# Patient Record
Sex: Female | Born: 1935 | Race: Black or African American | Hispanic: No | Marital: Married | Smoking: Former smoker
Health system: Southern US, Community
[De-identification: ages and names within clinical notes are randomized; demographics above are authoritative.]

## PROBLEM LIST (undated history)

## (undated) ENCOUNTER — Emergency Department (HOSPITAL_COMMUNITY): Admission: EM | Payer: Medicare Other | Source: Home / Self Care

## (undated) DIAGNOSIS — K219 Gastro-esophageal reflux disease without esophagitis: Secondary | ICD-10-CM

## (undated) DIAGNOSIS — M1990 Unspecified osteoarthritis, unspecified site: Secondary | ICD-10-CM

## (undated) DIAGNOSIS — I10 Essential (primary) hypertension: Secondary | ICD-10-CM

## (undated) DIAGNOSIS — I4891 Unspecified atrial fibrillation: Secondary | ICD-10-CM

## (undated) DIAGNOSIS — K5909 Other constipation: Secondary | ICD-10-CM

## (undated) DIAGNOSIS — L89159 Pressure ulcer of sacral region, unspecified stage: Secondary | ICD-10-CM

## (undated) DIAGNOSIS — E785 Hyperlipidemia, unspecified: Secondary | ICD-10-CM

## (undated) HISTORY — DX: Other constipation: K59.09

## (undated) HISTORY — DX: Pressure ulcer of sacral region, unspecified stage: L89.159

## (undated) HISTORY — PX: NO PAST SURGERIES: SHX2092

## (undated) HISTORY — DX: Unspecified osteoarthritis, unspecified site: M19.90

## (undated) HISTORY — DX: Gastro-esophageal reflux disease without esophagitis: K21.9

## (undated) HISTORY — DX: Hyperlipidemia, unspecified: E78.5

## (undated) HISTORY — DX: Unspecified atrial fibrillation: I48.91

---

## 2002-01-16 ENCOUNTER — Emergency Department (HOSPITAL_COMMUNITY): Admission: EM | Admit: 2002-01-16 | Discharge: 2002-01-16 | Payer: Self-pay | Admitting: Emergency Medicine

## 2002-10-30 ENCOUNTER — Emergency Department (HOSPITAL_COMMUNITY): Admission: EM | Admit: 2002-10-30 | Discharge: 2002-10-30 | Payer: Self-pay | Admitting: Emergency Medicine

## 2007-05-24 ENCOUNTER — Emergency Department (HOSPITAL_COMMUNITY): Admission: EM | Admit: 2007-05-24 | Discharge: 2007-05-24 | Payer: Self-pay | Admitting: Emergency Medicine

## 2007-05-29 ENCOUNTER — Inpatient Hospital Stay (HOSPITAL_COMMUNITY): Admission: AD | Admit: 2007-05-29 | Discharge: 2007-05-31 | Payer: Self-pay | Admitting: Cardiovascular Disease

## 2007-05-30 ENCOUNTER — Encounter (INDEPENDENT_AMBULATORY_CARE_PROVIDER_SITE_OTHER): Payer: Self-pay | Admitting: Cardiovascular Disease

## 2008-10-20 ENCOUNTER — Emergency Department (HOSPITAL_COMMUNITY): Admission: EM | Admit: 2008-10-20 | Discharge: 2008-10-21 | Payer: Self-pay | Admitting: Emergency Medicine

## 2008-10-22 ENCOUNTER — Ambulatory Visit (HOSPITAL_COMMUNITY): Admission: RE | Admit: 2008-10-22 | Discharge: 2008-10-22 | Payer: Self-pay | Admitting: Emergency Medicine

## 2008-10-22 ENCOUNTER — Encounter (INDEPENDENT_AMBULATORY_CARE_PROVIDER_SITE_OTHER): Payer: Self-pay | Admitting: Emergency Medicine

## 2008-10-22 ENCOUNTER — Ambulatory Visit: Payer: Self-pay | Admitting: Vascular Surgery

## 2009-09-07 ENCOUNTER — Emergency Department (HOSPITAL_COMMUNITY): Admission: EM | Admit: 2009-09-07 | Discharge: 2009-09-07 | Payer: Self-pay | Admitting: Emergency Medicine

## 2009-11-12 ENCOUNTER — Encounter: Admission: RE | Admit: 2009-11-12 | Discharge: 2009-11-12 | Payer: Self-pay | Admitting: Cardiovascular Disease

## 2009-11-28 ENCOUNTER — Encounter: Admission: RE | Admit: 2009-11-28 | Discharge: 2009-11-28 | Payer: Self-pay | Admitting: Cardiovascular Disease

## 2010-01-18 ENCOUNTER — Emergency Department (HOSPITAL_COMMUNITY): Admission: EM | Admit: 2010-01-18 | Discharge: 2010-01-19 | Payer: Self-pay | Admitting: Emergency Medicine

## 2010-01-22 ENCOUNTER — Emergency Department (HOSPITAL_COMMUNITY)
Admission: EM | Admit: 2010-01-22 | Discharge: 2010-01-22 | Payer: Self-pay | Source: Home / Self Care | Admitting: Emergency Medicine

## 2010-02-04 ENCOUNTER — Encounter: Admission: RE | Admit: 2010-02-04 | Discharge: 2010-02-04 | Payer: Self-pay | Admitting: Gastroenterology

## 2010-02-10 ENCOUNTER — Encounter: Admission: RE | Admit: 2010-02-10 | Discharge: 2010-02-10 | Payer: Self-pay | Admitting: Gastroenterology

## 2010-02-26 ENCOUNTER — Emergency Department (HOSPITAL_COMMUNITY): Admission: EM | Admit: 2010-02-26 | Discharge: 2010-02-26 | Payer: Self-pay | Admitting: Emergency Medicine

## 2010-10-05 LAB — DIFFERENTIAL
Eosinophils Relative: 4 % (ref 0–5)
Monocytes Absolute: 0.6 10*3/uL (ref 0.1–1.0)
Neutrophils Relative %: 57 % (ref 43–77)

## 2010-10-05 LAB — URINE CULTURE

## 2010-10-05 LAB — URINALYSIS, ROUTINE W REFLEX MICROSCOPIC
Bilirubin Urine: NEGATIVE
Glucose, UA: NEGATIVE mg/dL
Ketones, ur: NEGATIVE mg/dL
Protein, ur: NEGATIVE mg/dL
Specific Gravity, Urine: 1.011 (ref 1.005–1.030)
Urobilinogen, UA: 0.2 mg/dL (ref 0.0–1.0)

## 2010-10-05 LAB — CBC
HCT: 35.2 % — ABNORMAL LOW (ref 36.0–46.0)
MCH: 28.6 pg (ref 26.0–34.0)
Platelets: 272 10*3/uL (ref 150–400)
RDW: 14.8 % (ref 11.5–15.5)

## 2010-10-05 LAB — COMPREHENSIVE METABOLIC PANEL
ALT: 8 U/L (ref 0–35)
Alkaline Phosphatase: 96 U/L (ref 39–117)
BUN: 20 mg/dL (ref 6–23)
CO2: 25 mEq/L (ref 19–32)
Calcium: 9.6 mg/dL (ref 8.4–10.5)
Creatinine, Ser: 1.33 mg/dL — ABNORMAL HIGH (ref 0.4–1.2)
Glucose, Bld: 103 mg/dL — ABNORMAL HIGH (ref 70–99)
Total Protein: 7.3 g/dL (ref 6.0–8.3)

## 2010-10-05 LAB — URINE MICROSCOPIC-ADD ON

## 2010-10-08 LAB — POCT I-STAT, CHEM 8
BUN: 20 mg/dL (ref 6–23)
Calcium, Ion: 1.12 mmol/L (ref 1.12–1.32)
Chloride: 105 mEq/L (ref 96–112)
Potassium: 3.8 mEq/L (ref 3.5–5.1)

## 2010-10-08 LAB — URINALYSIS, ROUTINE W REFLEX MICROSCOPIC
Glucose, UA: NEGATIVE mg/dL
Ketones, ur: NEGATIVE mg/dL
Nitrite: POSITIVE — AB
Urobilinogen, UA: 1 mg/dL (ref 0.0–1.0)

## 2010-10-08 LAB — URINE CULTURE: Colony Count: >=100000

## 2010-10-08 LAB — URINE MICROSCOPIC-ADD ON
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
## 2011-01-22 ENCOUNTER — Telehealth: Payer: Self-pay | Admitting: Pulmonary Disease

## 2011-01-22 NOTE — Telephone Encounter (Signed)
message in wrong chart. Carron Curie, CMA

## 2011-04-28 LAB — COMPREHENSIVE METABOLIC PANEL
ALT: 10
AST: 26
Albumin: 3.3 — ABNORMAL LOW
CO2: 28
Calcium: 9.2
Creatinine, Ser: 1.08
GFR calc Af Amer: 58 — ABNORMAL LOW
GFR calc non Af Amer: 48 — ABNORMAL LOW
Sodium: 137
Total Protein: 7.1

## 2011-04-28 LAB — CK TOTAL AND CKMB (NOT AT ARMC)
CK, MB: 1.8
CK, MB: 2
Relative Index: 1.3
Total CK: 119
Total CK: 151

## 2011-04-28 LAB — BASIC METABOLIC PANEL
BUN: 11
Calcium: 8.8
Calcium: 9.6
Chloride: 106
Creatinine, Ser: 1.07
Creatinine, Ser: 1.14
GFR calc Af Amer: 59 — ABNORMAL LOW
GFR calc non Af Amer: 45 — ABNORMAL LOW
Glucose, Bld: 93
Sodium: 138

## 2011-04-28 LAB — CARDIAC PANEL(CRET KIN+CKTOT+MB+TROPI)
CK, MB: 2.7
Troponin I: 0.02
Troponin I: 0.03

## 2011-04-28 LAB — MAGNESIUM: Magnesium: 2.1

## 2011-04-28 LAB — CBC
MCHC: 32.8
MCV: 85.1
Platelets: 333
RBC: 3.87
RDW: 13.5
WBC: 5.7

## 2011-04-28 LAB — APTT: aPTT: 32

## 2011-04-28 LAB — DIFFERENTIAL
Eosinophils Relative: 1
Lymphocytes Relative: 29
Lymphs Abs: 1.8
Monocytes Relative: 9

## 2011-04-28 LAB — LIPID PANEL
HDL: 49
Triglycerides: 77
VLDL: 15

## 2011-04-28 LAB — TSH: TSH: 1.763

## 2013-11-23 ENCOUNTER — Ambulatory Visit (INDEPENDENT_AMBULATORY_CARE_PROVIDER_SITE_OTHER): Payer: PRIVATE HEALTH INSURANCE | Admitting: Podiatrist

## 2013-11-23 ENCOUNTER — Encounter: Payer: Self-pay | Admitting: Podiatrist

## 2013-11-23 VITALS — BP 149/85 | HR 68 | Resp 17 | Ht 63.0 in | Wt 180.0 lb

## 2013-11-23 DIAGNOSIS — M79609 Pain in unspecified limb: Secondary | ICD-10-CM

## 2013-11-23 DIAGNOSIS — B351 Tinea unguium: Secondary | ICD-10-CM

## 2013-11-23 DIAGNOSIS — M216X9 Other acquired deformities of unspecified foot: Secondary | ICD-10-CM

## 2013-11-23 DIAGNOSIS — Q828 Other specified congenital malformations of skin: Secondary | ICD-10-CM

## 2013-11-23 NOTE — Progress Notes (Signed)
   Subjective:    Patient ID: Sherry [name], female    DOB: 09/17/1919, 80 y.o.   MRN: [id number]  HPI Comments: Pt presents for debridement of 1 - 10 toenails, and left 945ft toe and MPJ corns.     Review of Systems  All other systems reviewed and are negative.      Objective:   Physical Exam .       Assessment & Plan:

## 2013-11-23 NOTE — Progress Notes (Signed)
HPI: Patient presents today for follow up of  foot and nail care. Past medical history, meds, and allergies reviewed.     Objective:  Patients chart is reviewed.  Vascular status reveals pedal pulses noted at  1 out of 4 dp and pt bilateral .  Neurological sensation is Normal to [facility]il[facility] bilateral at 5/5 sites bilateral.  Dermatological exam reveals  presence of pre ulcerative/ hyperkeratotic lesions submet 5 bilateral.   Toenails are elongated, incurvated, discolored, dystrophic with ingrown deformity present.    Assessment: Symptomatic mycotic toenails and hyperkeratotic lesion x2  Plan: Discussed treatment options and alternatives. Debrided nails without complication. Debrided hyperkeratotic lesions without complication.  Return appointment recommended at routine intervals of 3 months.   Marlowe AschoffKathryn Hancel Ion, DPM

## 2014-04-05 ENCOUNTER — Ambulatory Visit (INDEPENDENT_AMBULATORY_CARE_PROVIDER_SITE_OTHER): Payer: PRIVATE HEALTH INSURANCE | Admitting: Podiatrist

## 2014-04-05 DIAGNOSIS — M79609 Pain in unspecified limb: Secondary | ICD-10-CM

## 2014-04-05 DIAGNOSIS — M79676 Pain in unspecified toe(s): Principal | ICD-10-CM

## 2014-04-05 DIAGNOSIS — B351 Tinea unguium: Secondary | ICD-10-CM

## 2014-04-05 NOTE — Progress Notes (Signed)
HPI: Patient presents today for follow up of foot and nail care. Past medical history, meds, and allergies reviewed.  Objective: Patients chart is reviewed. Vascular status reveals pedal pulses noted at 1 out of 4 dp and pt bilateral . Neurological sensation is Normal to Semmes Weinstein monofilament bilateral at 5/5 sites bilateral. Dermatological exam reveals presence of pre ulcerative/ hyperkeratotic lesions submet 5 bilateral. Toenails are elongated, incurvated, discolored, dystrophic with ingrown deformity present.  Assessment: Symptomatic mycotic toenails and hyperkeratotic lesion x2  Plan: Discussed treatment options and alternatives. Debrided nails without complication. Debrided hyperkeratotic lesions without complication. Return appointment recommended at routine intervals of 3 months.  

## 2014-07-05 ENCOUNTER — Ambulatory Visit (INDEPENDENT_AMBULATORY_CARE_PROVIDER_SITE_OTHER): Payer: PRIVATE HEALTH INSURANCE | Admitting: Podiatrist

## 2014-07-05 ENCOUNTER — Encounter: Payer: Self-pay | Admitting: Podiatrist

## 2014-07-05 DIAGNOSIS — M79676 Pain in unspecified toe(s): Secondary | ICD-10-CM

## 2014-07-05 DIAGNOSIS — B351 Tinea unguium: Secondary | ICD-10-CM

## 2014-07-09 NOTE — Progress Notes (Signed)
HPI: Patient presents today for follow up of foot and nail care. Past medical history, meds, and allergies reviewed.  Objective: Patients chart is reviewed. Vascular status reveals pedal pulses noted at 1 out of 4 dp and pt bilateral . Neurological sensation is Normal to [facility] mon[facility] bilateral at 5/5 sites bilateral. Dermatological exam reveals presence of pre ulcerative/ hyperkeratotic lesions submet 5 bilateral. Toenails are elongated, incurvated, discolored, dystrophic with ingrown deformity present.  Assessment: Symptomatic mycotic toenails and hyperkeratotic lesion x2  Plan: Discussed treatment options and alternatives. Debrided nails without complication. Debrided hyperkeratotic lesions without complication. Return appointment recommended at routine intervals of 3 months.

## 2014-10-11 ENCOUNTER — Ambulatory Visit (INDEPENDENT_AMBULATORY_CARE_PROVIDER_SITE_OTHER): Payer: Medicare Other | Admitting: Podiatrist

## 2014-10-11 ENCOUNTER — Encounter: Payer: Self-pay | Admitting: Podiatrist

## 2014-10-11 DIAGNOSIS — M79676 Pain in unspecified toe(s): Secondary | ICD-10-CM

## 2014-10-11 DIAGNOSIS — B351 Tinea unguium: Secondary | ICD-10-CM | POA: Diagnosis not present

## 2014-10-20 NOTE — Progress Notes (Signed)
HPI: Patient presents today for follow up of foot and nail care. Past medical history, meds, and allergies reviewed.  Objective: Patients chart is reviewed. Vascular status reveals pedal pulses noted at 1 out of 4 dp and pt bilateral . Neurological sensation is Normal to Semmes Weinstein monofilament bilateral at 5/5 sites bilateral. Dermatological exam reveals presence of pre ulcerative/ hyperkeratotic lesions submet 5 bilateral. Toenails are elongated, incurvated, discolored, dystrophic with ingrown deformity present.  Assessment: Symptomatic mycotic toenails and hyperkeratotic lesion x2  Plan: Discussed treatment options and alternatives. Debrided nails without complication. Debrided hyperkeratotic lesions without complication. Return appointment recommended at routine intervals of 3 months.  

## 2014-12-20 ENCOUNTER — Ambulatory Visit: Payer: Medicaid Other | Admitting: Podiatry

## 2015-08-20 ENCOUNTER — Ambulatory Visit (INDEPENDENT_AMBULATORY_CARE_PROVIDER_SITE_OTHER): Payer: Medicare Other | Admitting: Podiatry

## 2015-08-20 ENCOUNTER — Encounter: Payer: Self-pay | Admitting: Podiatry

## 2015-08-20 DIAGNOSIS — M79676 Pain in unspecified toe(s): Secondary | ICD-10-CM | POA: Diagnosis not present

## 2015-08-20 DIAGNOSIS — B351 Tinea unguium: Secondary | ICD-10-CM

## 2015-08-20 NOTE — Progress Notes (Signed)
Patient ID: Sherry [name], female   DOB: 05-19-1920, 82 y.o.   MRN: [id number]  Subjective: This patient presents today complaining of uncomfortable toenails on right and left feet especially the first and second toenail areas. Patient presented for a similar problem on 10/11/2014  Objective: Patient's daughter-in-law present in the treatment room The toenails 6-10 are elongated, incurvated, discolored, deformed and tender to direct palpation Keratoses medial second left toe Callused nail groove right hallux nail  Assessment: Symptomatic onychomycoses 6-10 with associated friction keratoses  Plan: Debridement toenails 6-10 mechanically and electrically without any bleeding Recommend foam toe separator and left hallux  Reappoint at patient's request

## 2015-11-29 ENCOUNTER — Encounter: Payer: Self-pay | Admitting: Podiatry

## 2015-11-29 ENCOUNTER — Ambulatory Visit (INDEPENDENT_AMBULATORY_CARE_PROVIDER_SITE_OTHER): Payer: Medicare Other | Admitting: Podiatry

## 2015-11-29 DIAGNOSIS — B351 Tinea unguium: Secondary | ICD-10-CM | POA: Diagnosis not present

## 2015-11-29 DIAGNOSIS — M79676 Pain in unspecified toe(s): Secondary | ICD-10-CM | POA: Diagnosis not present

## 2015-12-02 NOTE — Progress Notes (Signed)
Patient ID: Sherry [name], female   DOB: 12/15/1919, 82 y.o.   MRN: [id number]  Subjective: 82 y.o. returns the office today for painful, elongated, thickened toenails which she cannot trim herself. Denies any redness or drainage around the nails. Denies any acute changes since last appointment and no new complaints today. Denies any systemic complaints such as fevers, chills, nausea, vomiting.   Objective: AAO 3, NAD DP/PT pulses palpable, CRT less than 3 seconds Nails hypertrophic, dystrophic, elongated, brittle, discolored 10. There is tenderness overlying the nails 1-5 bilaterally. There is no surrounding erythema or drainage along the nail sites. No open lesions or pre-ulcerative lesions are identified. No other areas of tenderness bilateral lower extremities. No overlying edema, erythema, increased warmth. No pain with calf compression, swelling, warmth, erythema.  Assessment: Patient presents with symptomatic onychomycosis  Plan: -Treatment options including alternatives, risks, complications were discussed -Nails sharply debrided 10 without complication/bleeding. -Discussed daily foot inspection. If there are any changes, to call the office immediately.  -Follow-up in 3 months or sooner if any problems are to arise. In the meantime, encouraged to call the office with any questions, concerns, changes symptoms.  Sherry CurdMatthew [name], DPM

## 2016-03-06 ENCOUNTER — Ambulatory Visit (INDEPENDENT_AMBULATORY_CARE_PROVIDER_SITE_OTHER): Payer: Medicare Other | Admitting: Podiatry

## 2016-03-06 ENCOUNTER — Encounter: Payer: Self-pay | Admitting: Podiatry

## 2016-03-06 DIAGNOSIS — B351 Tinea unguium: Secondary | ICD-10-CM

## 2016-03-06 DIAGNOSIS — M79676 Pain in unspecified toe(s): Secondary | ICD-10-CM | POA: Diagnosis not present

## 2016-03-09 NOTE — Progress Notes (Signed)
Patient ID: Sherry [name], female   DOB: 05/30/1920, 82 y.o.   MRN: [id number]  Subjective: 82 y.o. returns the office today for painful, elongated, thickened toenails which she cannot trim herself. Denies any redness or drainage around the nails. Denies any acute changes since last appointment and no new complaints today. Denies any systemic complaints such as fevers, chills, nausea, vomiting.   Objective: NAD DP/PT pulses palpable, CRT less than 3 seconds Nails hypertrophic, dystrophic, elongated, brittle, discolored 10. There is tenderness overlying the nails 1-5 bilaterally. There is no surrounding erythema or drainage along the nail sites. No open lesions or pre-ulcerative lesions are identified. No other areas of tenderness bilateral lower extremities. No overlying edema, erythema, increased warmth. No pain with calf compression, swelling, warmth, erythema.  Assessment: Patient presents with symptomatic onychomycosis  Plan: -Treatment options including alternatives, risks, complications were discussed -Nails sharply debrided 10 without complication/bleeding. -Discussed daily foot inspection. If there are any changes, to call the office immediately.  -Follow-up in 3 months or sooner if any problems are to arise. In the meantime, encouraged to call the office with any questions, concerns, changes symptoms.  Ovid CurdMatthew Wagoner, DPM

## 2016-06-05 ENCOUNTER — Encounter: Payer: Self-pay | Admitting: Podiatry

## 2016-06-05 ENCOUNTER — Ambulatory Visit (INDEPENDENT_AMBULATORY_CARE_PROVIDER_SITE_OTHER): Payer: Medicare Other | Admitting: Podiatry

## 2016-06-05 VITALS — Ht 63.0 in | Wt 180.0 lb

## 2016-06-05 DIAGNOSIS — M79676 Pain in unspecified toe(s): Secondary | ICD-10-CM

## 2016-06-05 DIAGNOSIS — Q828 Other specified congenital malformations of skin: Secondary | ICD-10-CM | POA: Diagnosis not present

## 2016-06-05 DIAGNOSIS — B351 Tinea unguium: Secondary | ICD-10-CM | POA: Diagnosis not present

## 2016-06-05 NOTE — Progress Notes (Signed)
Complaint:  Visit Type: Patient returns to my office for continued preventative foot care services. Complaint: Patient states" my nails have grown long and thick and become painful to walk and wear shoes" Patient has been diagnosed with DM with no foot complications. The patient presents for preventative foot care services. No changes to ROS.  Patient relates having painful callus on the outside of her left foot.  Podiatric Exam: Vascular: dorsalis pedis and posterior tibial pulses are palpable bilateral. Capillary return is immediate. Temperature gradient is WNL. Skin turgor WNL  Sensorium: Normal Semmes Weinstein monofilament test. Normal tactile sensation bilaterally. Nail Exam: Pt has thick disfigured discolored nails with subungual debris noted bilateral entire nail hallux through fifth toenails Ulcer Exam: There is no evidence of ulcer or pre-ulcerative changes or infection. Orthopedic Exam: Muscle tone and strength are WNL. No limitations in general ROM. No crepitus or effusions noted. HAV  B/L Skin:  Porokeratosis fifth met left foot. No infection or ulcers  Diagnosis:  Onychomycosis, , Pain in right toe, pain in left toes  Treatment & Plan Procedures and Treatment: Consent by patient was obtained for treatment procedures. The patient understood the discussion of treatment and procedures well. All questions were answered thoroughly reviewed. Debridement of mycotic and hypertrophic toenails, 1 through 5 bilateral and clearing of subungual debris. No ulceration, no infection noted.  Return Visit-Office Procedure: Patient instructed to return to the office for a follow up visit 3 months for continued evaluation and treatment.    Gardiner Barefoot DPM

## 2016-09-01 ENCOUNTER — Ambulatory Visit (INDEPENDENT_AMBULATORY_CARE_PROVIDER_SITE_OTHER): Payer: Medicare Other | Admitting: Podiatry

## 2016-09-01 ENCOUNTER — Encounter: Payer: Self-pay | Admitting: Podiatry

## 2016-09-01 DIAGNOSIS — M79676 Pain in unspecified toe(s): Secondary | ICD-10-CM | POA: Diagnosis not present

## 2016-09-01 DIAGNOSIS — Q828 Other specified congenital malformations of skin: Secondary | ICD-10-CM

## 2016-09-01 DIAGNOSIS — B351 Tinea unguium: Secondary | ICD-10-CM

## 2016-09-01 NOTE — Progress Notes (Addendum)
Complaint:  Visit Type: Patient returns to my office for continued preventative foot care services. Complaint: Patient states" my nails have grown long and thick and become painful to walk and wear shoes" . The patient presents for preventative foot care services. No changes to ROS.  Patient relates having painful callus on the outside of her left foot.  Podiatric Exam: Vascular: dorsalis pedis and posterior tibial pulses are palpable bilateral. Capillary return is immediate. Temperature gradient is WNL. Skin turgor WNL  Sensorium: Normal Semmes Weinstein monofilament test. Normal tactile sensation bilaterally. Nail Exam: Pt has thick disfigured discolored nails with subungual debris noted bilateral entire nail hallux through fifth toenails Ulcer Exam: There is no evidence of ulcer or pre-ulcerative changes or infection. Orthopedic Exam: Muscle tone and strength are WNL. No limitations in general ROM. No crepitus or effusions noted. HAV  B/L Skin:  Porokeratosis fifth met B/L. No infection or ulcers  Diagnosis:  Onychomycosis, , Pain in right toe, pain in left toes.  Debride porokeratosis sub 5th  B/L  Treatment & Plan Procedures and Treatment: Consent by patient was obtained for treatment procedures. The patient understood the discussion of treatment and procedures well. All questions were answered thoroughly reviewed. Debridement of mycotic and hypertrophic toenails, 1 through 5 bilateral and clearing of subungual debris. No ulceration, no infection noted.  Return Visit-Office Procedure: Patient instructed to return to the office for a follow up visit 3 months for continued evaluation and treatment.    Gardiner Barefoot DPM

## 2016-09-04 ENCOUNTER — Ambulatory Visit: Payer: Medicare Other | Admitting: Podiatry

## 2016-12-02 ENCOUNTER — Ambulatory Visit (INDEPENDENT_AMBULATORY_CARE_PROVIDER_SITE_OTHER): Payer: Medicare Other | Admitting: Podiatry

## 2016-12-02 DIAGNOSIS — B351 Tinea unguium: Secondary | ICD-10-CM | POA: Diagnosis not present

## 2016-12-02 DIAGNOSIS — M79676 Pain in unspecified toe(s): Secondary | ICD-10-CM | POA: Diagnosis not present

## 2016-12-02 NOTE — Progress Notes (Signed)
Complaint:  Visit Type: Patient returns to my office for continued preventative foot care services. Complaint: Patient states" my nails have grown long and thick and become painful to walk and wear shoes" . The patient presents for preventative foot care services. No changes to ROS.  Patient relates having painful callus on the outside of her left foot.  Podiatric Exam: Vascular: dorsalis pedis and posterior tibial pulses are palpable bilateral. Capillary return is immediate. Temperature gradient is WNL. Skin turgor WNL  Sensorium: Normal Semmes Weinstein monofilament test. Normal tactile sensation bilaterally. Nail Exam: Pt has thick disfigured discolored nails with subungual debris noted bilateral entire nail hallux through fifth toenails Ulcer Exam: There is no evidence of ulcer or pre-ulcerative changes or infection. Orthopedic Exam: Muscle tone and strength are WNL. No limitations in general ROM. No crepitus or effusions noted. HAV  B/L Skin:  Porokeratosis fifth met B/L. No infection or ulcers  Diagnosis:  Onychomycosis, , Pain in right toe, pain in left toes.  Debride porokeratosis sub 5th  B/L  Treatment & Plan Procedures and Treatment: Consent by patient was obtained for treatment procedures. The patient understood the discussion of treatment and procedures well. All questions were answered thoroughly reviewed. Debridement of mycotic and hypertrophic toenails, 1 through 5 bilateral and clearing of subungual debris. No ulceration, no infection noted.  Return Visit-Office Procedure: Patient instructed to return to the office for a follow up visit 3 months for continued evaluation and treatment.    Gardiner Barefoot DPM

## 2017-01-02 ENCOUNTER — Encounter (HOSPITAL_COMMUNITY): Payer: Self-pay | Admitting: Emergency Medicine

## 2017-01-02 ENCOUNTER — Emergency Department (HOSPITAL_COMMUNITY): Payer: Medicare Other

## 2017-01-02 ENCOUNTER — Emergency Department (HOSPITAL_COMMUNITY)
Admission: EM | Admit: 2017-01-02 | Discharge: 2017-01-02 | Disposition: A | Discharge status: Home / Self Care | Payer: Medicare Other | Attending: Emergency Medicine | Admitting: Emergency Medicine

## 2017-01-02 DIAGNOSIS — I10 Essential (primary) hypertension: Secondary | ICD-10-CM | POA: Insufficient documentation

## 2017-01-02 DIAGNOSIS — Z87891 Personal history of nicotine dependence: Secondary | ICD-10-CM | POA: Diagnosis not present

## 2017-01-02 DIAGNOSIS — Y9301 Activity, walking, marching and hiking: Secondary | ICD-10-CM | POA: Diagnosis not present

## 2017-01-02 DIAGNOSIS — Y999 Unspecified external cause status: Secondary | ICD-10-CM | POA: Insufficient documentation

## 2017-01-02 DIAGNOSIS — Y929 Unspecified place or not applicable: Secondary | ICD-10-CM | POA: Insufficient documentation

## 2017-01-02 DIAGNOSIS — W01198A Fall on same level from slipping, tripping and stumbling with subsequent striking against other object, initial encounter: Secondary | ICD-10-CM | POA: Insufficient documentation

## 2017-01-02 DIAGNOSIS — S7002XA Contusion of left hip, initial encounter: Secondary | ICD-10-CM | POA: Diagnosis not present

## 2017-01-02 DIAGNOSIS — S43402A Unspecified sprain of left shoulder joint, initial encounter: Secondary | ICD-10-CM | POA: Diagnosis not present

## 2017-01-02 DIAGNOSIS — S4992XA Unspecified injury of left shoulder and upper arm, initial encounter: Secondary | ICD-10-CM | POA: Diagnosis present

## 2017-01-02 DIAGNOSIS — Z79899 Other long term (current) drug therapy: Secondary | ICD-10-CM | POA: Diagnosis not present

## 2017-01-02 DIAGNOSIS — W19XXXA Unspecified fall, initial encounter: Secondary | ICD-10-CM

## 2017-01-02 HISTORY — DX: Essential (primary) hypertension: I10

## 2017-01-02 MED ORDER — TRAMADOL HCL 50 MG PO TABS: 50.0000 mg | ORAL_TABLET | Freq: Four times a day (QID) | ORAL | 0 refills | Status: DC | PRN

## 2017-01-02 NOTE — ED Triage Notes (Signed)
Pt complaint of left shoulder and left hip pain post fall trying to get out of chair last night; denies blood thinner use or LOC.

## 2017-01-02 NOTE — Discharge Instructions (Signed)
Tramadol as prescribed as needed for pain.  Ice for 20 minutes every 2 hours while awake for the next 2 days.  Follow-up with your primary Dr. if not improving in the next week.

## 2017-01-02 NOTE — ED Provider Notes (Signed)
[facility]-EMERGENCY DEPT Provider Note   CSN: [id number] Arrival date & time: 01/02/17  1209     History   Chief Complaint Chief Complaint  Patient presents with  . Fall  . Hip Pain  . Shoulder Pain    HPI Sherry [name] is a 83 y.o. female.  Patient is a 83 year old female with history of hypertension presenting for evaluation of fall. She was attempting to get up from her walker yesterday when she caught her dress on the corner of the walker, causing her to lose her balance and fall. She reports landing on the left side injuring her left shoulder and left hip. She was able to get up and has been ambulatory since. She does complain of some discomfort in her left shoulder with movement. She denies any numbness or tingling. She denies any other injury sustained in the fall.   The history is provided by the patient.  Fall  This is a new problem. The current episode started yesterday. The problem occurs constantly. The problem has not changed since onset.The symptoms are aggravated by walking (Movement and palpation). Nothing relieves the symptoms. She has tried nothing for the symptoms.    Past Medical History:  Diagnosis Date  . Hypertension     There are no active problems to display for this patient.   History reviewed. No pertinent surgical history.  OB History    No data available       Home Medications    Prior to Admission medications   Medication Sig Start Date End Date Taking? Authorizing Provider  amLODipine-olmesartan (AZOR) 5-20 MG per tablet Take 1 tablet by mouth daily.    [provider]  atorvastatin (LIPITOR) 20 MG tablet Take 20 mg by mouth daily.    [provider]  metoprolol succinate (TOPROL-XL) 25 MG 24 hr tablet Take 25 mg by mouth daily.    [provider]    Family History No family history on file.  Social History Social History  Substance Use Topics  . Smoking status: Former Games developermoker  . Smokeless tobacco: Never  Used  . Alcohol use No     Allergies   Patient has no known allergies.   Review of Systems Review of Systems  All other systems reviewed and are negative.    Physical Exam Updated Vital Signs BP (!) 154/66 (BP Location: Right Arm)   Pulse 66   Temp 97.9 F (36.6 C) (Oral)   Resp 18   Ht 5\' 4"  (1.626 m)   Wt 79.4 kg (175 lb)   SpO2 96%   BMI 30.04 kg/m   Physical Exam  Constitutional: She is oriented to person, place, and time. She appears well-developed and well-nourished. No distress.  HENT:  Head: Normocephalic and atraumatic.  Neck: Normal range of motion. Neck supple.  Cardiovascular: Normal rate and regular rhythm.  Exam reveals no gallop and no friction rub.   No murmur heard. Pulmonary/Chest: Effort normal and breath sounds normal. No respiratory distress. She has no wheezes.  Abdominal: Soft. Bowel sounds are normal. She exhibits no distension. There is no tenderness.  Musculoskeletal: Normal range of motion.  The left shoulder appears mostly normal. She has good range of motion with minimal discomfort. There is some tenderness over the before meals joint and lateral aspect. She is able to flex, extend, and oppose all fingers. Ulnar and radial pulses are easily palpable, and sensation is intact throughout the entire hand.  The left hip appears grossly normal. There  is slight tenderness over the lateral aspect. She has good range of motion and distal PMS is intact.  Neurological: She is alert and oriented to person, place, and time.  Skin: Skin is warm and dry. She is not diaphoretic.  Nursing note and vitals reviewed.    ED Treatments / Results  Labs (all labs ordered are listed, but only abnormal results are displayed) Labs Reviewed - No data to display  EKG  EKG Interpretation None       Radiology Dg Shoulder Left  Result Date: 01/02/2017 CLINICAL DATA:  Fall at home yesterday. Left shoulder pain. Initial encounter. EXAM: LEFT SHOULDER - 2+ VIEW  COMPARISON:  None. FINDINGS: No acute fracture. No dislocation. Advanced glenohumeral osteoarthritis with spurring and humeral head remodeling. High humeral head suggesting chronic rotator cuff tear. Negative visualized left ribs. IMPRESSION: 1. No acute finding. 2. Advanced glenohumeral osteoarthritis. High humeral head suggesting chronic rotator cuff tear. Electronically Signed   By: Marnee Spring M.D.   On: 01/02/2017 14:31   Dg Hip Unilat W Or Wo Pelvis 2-3 Views Left  Result Date: 01/02/2017 CLINICAL DATA:  Fall at home yesterday which generalized left hip pain. EXAM: DG HIP (WITH OR WITHOUT PELVIS) 2-3V LEFT COMPARISON:  None. FINDINGS: No visible fracture or malalignment. Left hip osteoarthritis with moderate spurring and axial joint narrowing. Questionable remote inferior pubic ramus fracture on the right. Osteopenia. Lower lumbar degenerative disc disease. IMPRESSION: 1. No acute finding. 2. Asymmetric left hip osteoarthritis. Electronically Signed   By: Marnee Spring M.D.   On: 01/02/2017 14:33    Procedures Procedures (including critical care time)  Medications Ordered in ED Medications - No data to display   Initial Impression / Assessment and Plan / ED Course  I have reviewed the triage vital signs and the nursing notes.  Pertinent labs & imaging results that were available during my care of the patient were reviewed by me and considered in my medical decision making (see chart for details).  X-rays are negative for fracture. She is ambulatory and has good range of motion of the shoulder. I highly doubt occult fracture or significant ligamentous injury. She will be discharged with tramadol, rest, and follow-up as needed if not improving.  Final Clinical Impressions(s) / ED Diagnoses   Final diagnoses:  None    New Prescriptions New Prescriptions   No medications on file     Geoffery Lyons, MD 01/02/17 1512

## 2017-01-15 ENCOUNTER — Emergency Department (HOSPITAL_COMMUNITY): Payer: Medicare Other

## 2017-01-15 ENCOUNTER — Emergency Department (HOSPITAL_COMMUNITY)
Admission: EM | Admit: 2017-01-15 | Discharge: 2017-01-15 | Disposition: A | Discharge status: Home / Self Care | Payer: Medicare Other | Attending: Emergency Medicine | Admitting: Emergency Medicine

## 2017-01-15 ENCOUNTER — Encounter (HOSPITAL_COMMUNITY): Payer: Self-pay | Admitting: *Deleted

## 2017-01-15 DIAGNOSIS — Z87891 Personal history of nicotine dependence: Secondary | ICD-10-CM | POA: Diagnosis not present

## 2017-01-15 DIAGNOSIS — Y998 Other external cause status: Secondary | ICD-10-CM | POA: Diagnosis not present

## 2017-01-15 DIAGNOSIS — I10 Essential (primary) hypertension: Secondary | ICD-10-CM | POA: Diagnosis not present

## 2017-01-15 DIAGNOSIS — Y929 Unspecified place or not applicable: Secondary | ICD-10-CM | POA: Insufficient documentation

## 2017-01-15 DIAGNOSIS — S46912A Strain of unspecified muscle, fascia and tendon at shoulder and upper arm level, left arm, initial encounter: Secondary | ICD-10-CM | POA: Diagnosis not present

## 2017-01-15 DIAGNOSIS — S4992XA Unspecified injury of left shoulder and upper arm, initial encounter: Secondary | ICD-10-CM | POA: Diagnosis present

## 2017-01-15 DIAGNOSIS — W19XXXA Unspecified fall, initial encounter: Secondary | ICD-10-CM | POA: Diagnosis not present

## 2017-01-15 DIAGNOSIS — Z79899 Other long term (current) drug therapy: Secondary | ICD-10-CM | POA: Insufficient documentation

## 2017-01-15 DIAGNOSIS — Y939 Activity, unspecified: Secondary | ICD-10-CM | POA: Diagnosis not present

## 2017-01-15 MED ORDER — ACETAMINOPHEN 325 MG PO TABS
650.0000 mg | ORAL_TABLET | Freq: Once | ORAL | Status: AC
  Administered 2017-01-15: 650 mg via ORAL
  Filled 2017-01-15: qty 2

## 2017-01-15 NOTE — ED Triage Notes (Signed)
Pt reports L shoulder pain onset last week post fall, [facility], states, "they said it wasn't broke but it started hurting again and I cannot move it." pt c/o productive cough, A&O x4

## 2017-01-15 NOTE — ED Provider Notes (Signed)
[facility]-EMERGENCY DEPT Provider Note   CSN: [id number] Arrival date & time: 01/15/17  1555     History   Chief Complaint Chief Complaint  Patient presents with  . Shoulder Pain    HPI Sherry [name] is a 83 y.o. female.  The history is provided by the patient and a relative. No language interpreter was used.  Shoulder Pain      Sherry [name] is a 83 y.o. female who presents to the Emergency Department complaining of shoulder pain.  She reports pain in her left shoulder following a fall that occurred 1 week ago. When she fell she landed on her left shoulder and hip. She has been taking tramadol and naproxen at home with partial improvement in her symptoms. No fevers, chest pain, shortness of breath, abdominal pain, vomiting. Pain in her shoulder is worse with range of motion. The pain does radiate to the left side of her neck at times. She is right-handed. Symptoms are moderate, constant, worsening.  Past Medical History:  Diagnosis Date  . Hypertension     There are no active problems to display for this patient.   History reviewed. No pertinent surgical history.  OB History    No data available       Home Medications    Prior to Admission medications   Medication Sig Start Date End Date Taking? Authorizing Provider  amLODipine (NORVASC) 5 MG tablet Take 5 mg by mouth daily. 10/26/16   [provider]  amLODipine-olmesartan (AZOR) 5-20 MG per tablet Take 1 tablet by mouth daily.    [provider]  atorvastatin (LIPITOR) 20 MG tablet Take 20 mg by mouth daily.    [provider]  losartan (COZAAR) 50 MG tablet Take 50 mg by mouth daily. 10/26/16   [provider]  metoprolol succinate (TOPROL-XL) 25 MG 24 hr tablet Take 25 mg by mouth daily.    [provider]  metoprolol tartrate (LOPRESSOR) 25 MG tablet Take 25 mg by mouth 2 (two) times daily. 11/09/16   [provider]  traMADol (ULTRAM) 50 MG tablet Take 1 tablet  (50 mg total) by mouth every 6 (six) hours as needed. 01/02/17   Geoffery Lyons, MD    Family History No family history on file.  Social History Social History  Substance Use Topics  . Smoking status: Former Games developer  . Smokeless tobacco: Never Used  . Alcohol use No     Allergies   Patient has no known allergies.   Review of Systems Review of Systems  All other systems reviewed and are negative.    Physical Exam Updated Vital Signs BP 97/75   Pulse 91   Temp 99.3 F (37.4 C) (Oral)   Resp 20   SpO2 98%   Physical Exam  Constitutional: She is oriented to person, place, and time. She appears well-developed and well-nourished.  HENT:  Head: Normocephalic and atraumatic.  Cardiovascular: Normal rate and regular rhythm.   Murmur heard. Pulmonary/Chest: Effort normal and breath sounds normal. No respiratory distress.  Abdominal: Soft. There is no tenderness. There is no rebound and no guarding.  Musculoskeletal:  2+ radial pulses bilaterally. There is tenderness to palpation over the left posterior shoulder with pain with active and pain right passive range of motion of the left shoulder. No tenderness throughout the left upper arm, elbow, forearm, chest, neck. No erythema to the shoulder  Neurological: She is alert and oriented to person, place, and time.  5 out 5  grip strength to bilateral upper extremities with sensation to light touch intact in bilateral upper extremities  Skin: Skin is warm and dry.  Psychiatric: She has a normal mood and affect. Her behavior is normal.  Nursing note and vitals reviewed.    ED Treatments / Results  Labs (all labs ordered are listed, but only abnormal results are displayed) Labs Reviewed - No data to display  EKG  EKG Interpretation None       Radiology Dg Chest 2 View  Result Date: 01/15/2017 CLINICAL DATA:  Left-sided neck pain and shoulder pain tonight. EXAM: CHEST  2 VIEW COMPARISON:  05/27/2007 FINDINGS: Moderate  cardiomegaly and aortic tortuosity. No consolidation. No effusion. Normal pulmonary vasculature. IMPRESSION: Cardiomegaly and aortic tortuosity.  No consolidation or effusion. Electronically Signed   By: Ellery Plunkaniel R Mitchell M.D.   On: 01/15/2017 22:40   Dg Shoulder Left  Result Date: 01/15/2017 CLINICAL DATA:  83 year old female left shoulder pain following fall last week. EXAM: LEFT SHOULDER - 2+ VIEW COMPARISON:  01/02/2017 prior radiographs FINDINGS: Severe glenohumeral joint degenerative changes are noted. No definite fracture is noted but evaluation slightly limited secondary to degenerative changes. There is no evidence of dislocation. IMPRESSION: No definite acute bony abnormality. Severe glenohumeral joint degenerative changes with slightly limits sensitivity. Electronically Signed   By: Harmon PierJeffrey  Hu M.D.   On: 01/15/2017 17:55    Procedures Procedures (including critical care time)  Medications Ordered in ED Medications  acetaminophen (TYLENOL) tablet 650 mg (650 mg Oral Given 01/15/17 2154)     Initial Impression / Assessment and Plan / ED Course  I have reviewed the triage vital signs and the nursing notes.  Pertinent labs & imaging results that were available during my care of the patient were reviewed by me and considered in my medical decision making (see chart for details).     Patient here for evaluation of left shoulder pain following a fall 1 week ago. No evidence of acute fracture or dislocation. Presentation is not consistent with referred cardiac/pulmonary pain, septic arthritis or gouty arthritis. Counseled pt on home care for shoulder pain/contusion. Discussed Tylenol as well as Aleve. Discussed sling for comfort with range of motion. Discussed outpatient follow-up and return precautions.  Final Clinical Impressions(s) / ED Diagnoses   Final diagnoses:  Strain of left shoulder, initial encounter    New Prescriptions New Prescriptions   No medications on file       Tilden Fossaees, Isbella Arline, MD 01/15/17 2258

## 2017-01-15 NOTE — ED Notes (Signed)
ED Provider at bedside. 

## 2017-01-15 NOTE — ED Notes (Signed)
[facility], informed that I have assigned a room and one it is clean staff will be out to get them.

## 2017-01-15 NOTE — ED Notes (Signed)
Patient transported to X-ray 

## 2017-01-15 NOTE — ED Notes (Signed)
Pt family member requesting update, apologized for delay.

## 2017-03-10 ENCOUNTER — Ambulatory Visit (INDEPENDENT_AMBULATORY_CARE_PROVIDER_SITE_OTHER): Payer: Medicare Other | Admitting: Podiatry

## 2017-03-10 ENCOUNTER — Encounter: Payer: Self-pay | Admitting: Podiatry

## 2017-03-10 DIAGNOSIS — M79676 Pain in unspecified toe(s): Secondary | ICD-10-CM | POA: Diagnosis not present

## 2017-03-10 DIAGNOSIS — B351 Tinea unguium: Secondary | ICD-10-CM | POA: Diagnosis not present

## 2017-03-10 NOTE — Progress Notes (Signed)
Complaint:  Visit Type: Patient returns to my office for continued preventative foot care services. Complaint: Patient states" my nails have grown long and thick and become painful to walk and wear shoes" . The patient presents for preventative foot care services. No changes to ROS.  Patient relates having painful callus on the outside of her left foot.  Podiatric Exam: Vascular: dorsalis pedis and posterior tibial pulses are palpable bilateral. Capillary return is immediate. Temperature gradient is WNL. Skin turgor WNL  Sensorium: Normal Semmes Weinstein monofilament test. Normal tactile sensation bilaterally. Nail Exam: Pt has thick disfigured discolored nails with subungual debris noted bilateral entire nail hallux through fifth toenails Ulcer Exam: There is no evidence of ulcer or pre-ulcerative changes or infection. Orthopedic Exam: Muscle tone and strength are WNL. No limitations in general ROM. No crepitus or effusions noted. HAV  B/L Skin:   Asymptomatic porokeratosis sub 5th  B/Lorokeratosis fifth met B/L. No infection or ulcers  Diagnosis:  Onychomycosis, , Pain in right toe, pain in left toes.    Treatment & Plan Procedures and Treatment: Consent by patient was obtained for treatment procedures. The patient understood the discussion of treatment and procedures well. All questions were answered thoroughly reviewed. Debridement of mycotic and hypertrophic toenails, 1 through 5 bilateral and clearing of subungual debris. No ulceration, no infection noted.  Return Visit-Office Procedure: Patient instructed to return to the office for a follow up visit 3 months for continued evaluation and treatment.    Gardiner Barefoot DPM

## 2017-05-25 ENCOUNTER — Encounter: Payer: Self-pay | Admitting: Podiatry

## 2017-05-25 ENCOUNTER — Ambulatory Visit (INDEPENDENT_AMBULATORY_CARE_PROVIDER_SITE_OTHER): Payer: Medicare Other | Admitting: Podiatry

## 2017-05-25 DIAGNOSIS — M79676 Pain in unspecified toe(s): Secondary | ICD-10-CM

## 2017-05-25 DIAGNOSIS — B351 Tinea unguium: Secondary | ICD-10-CM | POA: Diagnosis not present

## 2017-05-25 NOTE — Progress Notes (Signed)
Complaint:  Visit Type: Patient returns to my office for continued preventative foot care services. Complaint: Patient states" my nails have grown long and thick and become painful to walk and wear shoes" . The patient presents for preventative foot care services. No changes to ROS.  Patient relates having painful callus on the outside of her left foot.  Podiatric Exam: Vascular: dorsalis pedis and posterior tibial pulses are palpable bilateral. Capillary return is immediate. Temperature gradient is WNL. Skin turgor WNL  Sensorium: Normal Semmes Weinstein monofilament test. Normal tactile sensation bilaterally. Nail Exam: Pt has thick disfigured discolored nails with subungual debris noted bilateral entire nail hallux through fifth toenails Ulcer Exam: There is no evidence of ulcer or pre-ulcerative changes or infection. Orthopedic Exam: Muscle tone and strength are WNL. No limitations in general ROM. No crepitus or effusions noted. HAV  B/L Skin:   Asymptomatic porokeratosis sub 5th  B/Lorokeratosis fifth met B/L. No infection or ulcers  Diagnosis:  Onychomycosis, , Pain in right toe, pain in left toes.    Treatment & Plan Procedures and Treatment: Consent by patient was obtained for treatment procedures. The patient understood the discussion of treatment and procedures well. All questions were answered thoroughly reviewed. Debridement of mycotic and hypertrophic toenails, 1 through 5 bilateral and clearing of subungual debris. No ulceration, no infection noted. ABN signed for 2018. Return Visit-Office Procedure: Patient instructed to return to the office for a follow up visit 3 months for continued evaluation and treatment.    Gardiner Barefoot DPM

## 2017-08-03 ENCOUNTER — Ambulatory Visit: Payer: Medicare Other | Admitting: Podiatry

## 2017-08-07 ENCOUNTER — Encounter (HOSPITAL_COMMUNITY): Payer: Self-pay | Admitting: Emergency Medicine

## 2017-08-07 ENCOUNTER — Emergency Department (HOSPITAL_COMMUNITY): Payer: Medicare Other

## 2017-08-07 ENCOUNTER — Emergency Department (HOSPITAL_COMMUNITY)
Admission: EM | Admit: 2017-08-07 | Discharge: 2017-08-07 | Disposition: A | Discharge status: Home / Self Care | Payer: Medicare Other | Attending: Emergency Medicine | Admitting: Emergency Medicine

## 2017-08-07 DIAGNOSIS — M25512 Pain in left shoulder: Secondary | ICD-10-CM | POA: Diagnosis not present

## 2017-08-07 DIAGNOSIS — I10 Essential (primary) hypertension: Secondary | ICD-10-CM | POA: Insufficient documentation

## 2017-08-07 DIAGNOSIS — Z79899 Other long term (current) drug therapy: Secondary | ICD-10-CM | POA: Insufficient documentation

## 2017-08-07 DIAGNOSIS — R079 Chest pain, unspecified: Secondary | ICD-10-CM | POA: Insufficient documentation

## 2017-08-07 DIAGNOSIS — Z87891 Personal history of nicotine dependence: Secondary | ICD-10-CM | POA: Insufficient documentation

## 2017-08-07 LAB — BASIC METABOLIC PANEL
ANION GAP: 12 (ref 5–15)
BUN: 22 mg/dL — ABNORMAL HIGH (ref 6–20)
CHLORIDE: 103 mmol/L (ref 101–111)
CO2: 23 mmol/L (ref 22–32)
Calcium: 9.4 mg/dL (ref 8.9–10.3)
Creatinine, Ser: 1.2 mg/dL — ABNORMAL HIGH (ref 0.44–1.00)
GFR calc Af Amer: 43 mL/min — ABNORMAL LOW (ref >60)
GFR, EST NON AFRICAN AMERICAN: 37 mL/min — AB (ref >60)
GLUCOSE: 83 mg/dL (ref 65–99)
POTASSIUM: 4 mmol/L (ref 3.5–5.1)
Sodium: 138 mmol/L (ref 135–145)

## 2017-08-07 LAB — CBC
HEMATOCRIT: 36.9 % (ref 36.0–46.0)
HEMOGLOBIN: 12 g/dL (ref 12.0–15.0)
MCH: 28.3 pg (ref 26.0–34.0)
MCHC: 32.5 g/dL (ref 30.0–36.0)
MCV: 87 fL (ref 78.0–100.0)
Platelets: 313 10*3/uL (ref 150–400)
RBC: 4.24 MIL/uL (ref 3.87–5.11)
RDW: 13.7 % (ref 11.5–15.5)
WBC: 5.6 10*3/uL (ref 4.0–10.5)

## 2017-08-07 LAB — I-STAT TROPONIN, ED: Troponin i, poc: 0.01 ng/mL (ref 0.00–0.08)

## 2017-08-07 MED ORDER — TRAMADOL HCL 50 MG PO TABS: 50.0000 mg | ORAL_TABLET | Freq: Two times a day (BID) | ORAL | 0 refills | Status: DC | PRN

## 2017-08-07 NOTE — ED Notes (Signed)
Patient able to ambulate independently  

## 2017-08-07 NOTE — ED Notes (Signed)
Pt reports that her daughter recently passed and her funeral was today.

## 2017-08-07 NOTE — ED Provider Notes (Signed)
[facility] EMERGENCY DEPARTMENT Provider Note   CSN: [id number] Arrival date & time: 08/07/17  1451     History   Chief Complaint Chief Complaint  Patient presents with  . Chest Pain    HPI Sherry [name] is a 84 y.o. female.  Patient reports left superior anterior chest pain and associated left shoulder joint pain for unknown period of time.  Her daughter passed away recently and the funeral was today.  No dyspnea, diaphoresis, nausea.  Past medical history hypertension.  No known cardiac disease.  Pain in shoulder is worse with range of motion.  Severity of symptoms is mild.      Past Medical History:  Diagnosis Date  . Hypertension     There are no active problems to display for this patient.   History reviewed. No pertinent surgical history.  OB History    No data available       Home Medications    Prior to Admission medications   Medication Sig Start Date End Date Taking? Authorizing Provider  amLODipine (NORVASC) 5 MG tablet Take 5 mg by mouth daily. 10/26/16   [provider]  amLODipine-olmesartan (AZOR) 5-20 MG per tablet Take 1 tablet by mouth daily.    [provider]  atorvastatin (LIPITOR) 20 MG tablet Take 20 mg by mouth daily.    [provider]  losartan (COZAAR) 50 MG tablet Take 50 mg by mouth daily. 10/26/16   [provider]  metoprolol succinate (TOPROL-XL) 25 MG 24 hr tablet Take 25 mg by mouth daily.    [provider]  metoprolol tartrate (LOPRESSOR) 25 MG tablet Take 25 mg by mouth 2 (two) times daily. 11/09/16   [provider]  traMADol (ULTRAM) 50 MG tablet Take 1 tablet (50 mg total) by mouth 2 (two) times daily as needed. 08/07/17   Donnetta Hutching, MD    Family History No family history on file.  Social History Social History   Tobacco Use  . Smoking status: Former Games developer  . Smokeless tobacco: Never Used  Substance Use Topics  . Alcohol use: No  . Drug use:  No     Allergies   Patient has no known allergies.   Review of Systems Review of Systems  All other systems reviewed and are negative.    Physical Exam Updated Vital Signs BP (!) 143/63   Pulse 78   Temp 98.3 F (36.8 C) (Oral)   Resp 17   Ht 4\' 11"  (1.499 m)   Wt 81.2 kg (179 lb)   SpO2 98%   BMI 36.15 kg/m   Physical Exam  Constitutional: She is oriented to person, place, and time. She appears well-developed and well-nourished.  HENT:  Head: Normocephalic and atraumatic.  Eyes: Conjunctivae are normal.  Neck: Neck supple.  Cardiovascular: Normal rate and regular rhythm.  Pulmonary/Chest: Effort normal and breath sounds normal.  Abdominal: Soft. Bowel sounds are normal.  Musculoskeletal:  Limited range of motion of left shoulder  Neurological: She is alert and oriented to person, place, and time.  Skin: Skin is warm and dry.  Psychiatric: She has a normal mood and affect. Her behavior is normal.  Nursing note and vitals reviewed.    ED Treatments / Results  Labs (all labs ordered are listed, but only abnormal results are displayed) Labs Reviewed  BASIC METABOLIC PANEL - Abnormal; Notable for the following components:      Result Value   BUN 22 (*)  Creatinine, Ser 1.20 (*)    GFR calc non Af Amer 37 (*)    GFR calc Af Amer 43 (*)    All other components within normal limits  CBC  I-STAT TROPONIN, ED    EKG  EKG Interpretation  Date/Time:  Saturday August 07 2017 14:57:53 EST Ventricular Rate:  74 PR Interval:  284 QRS Duration: 140 QT Interval:  442 QTC Calculation: 490 R Axis:   -59 Text Interpretation:  Sinus rhythm with 1st degree A-V block Left axis deviation Right bundle branch block Inferior infarct , age undetermined Abnormal ECG Confirmed by Donnetta Hutchingook, Anaid Haney ([id number]) on 08/07/2017 5:39:26 PM       Radiology Dg Chest 2 View  Result Date: 08/07/2017 CLINICAL DATA:  Pt c/o chest pain that radiates to the left shoulder. Pt reports a  productive cough and fall in the last few days. Denies shortness of breath/nausea/back pain/diaphoresis. Hx of HTN. Former smoker. EXAM: CHEST  2 VIEW COMPARISON:  None. FINDINGS: Normal mediastinum and cardiac silhouette. Normal pulmonary vasculature. No evidence of effusion, infiltrate, or pneumothorax. No acute bony abnormality. Atherosclerotic calcification of the aorta. Multilevel disc osteophytic disease of the spine. Degenerate change of the shoulders. IMPRESSION: 1.  No acute cardiopulmonary process. 2.  Aortic Atherosclerosis (ICD10-I70.0). Electronically Signed   By: Genevive BiStewart  Edmunds M.D.   On: 08/07/2017 17:04   Dg Shoulder Left  Result Date: 08/07/2017 CLINICAL DATA:  Left side shoulder pain x1 week. Pain worsening over the past 3 days. Hx of arthritis in the knees. No hx of left shoulder injuries or surgeries. EXAM: LEFT SHOULDER - 2+ VIEW COMPARISON:  01/15/2017 FINDINGS: There is severe narrowing of the glenohumeral joint. Bone on bone approximation. No fracture dislocation. No change from prior. IMPRESSION: 1. Severe arthropathy of the shoulder joint. 2. No acute findings. Electronically Signed   By: Genevive BiStewart  Edmunds M.D.   On: 08/07/2017 19:03    Procedures Procedures (including critical care time)  Medications Ordered in ED Medications - No data to display   Initial Impression / Assessment and Plan / ED Course  I have reviewed the triage vital signs and the nursing notes.  Pertinent labs & imaging results that were available during my care of the patient were reviewed by me and considered in my medical decision making (see chart for details).    Patient presents with chest pain and left shoulder pain. Cardiac workup is acceptable.  Left shoulder films reveal significant arthropathy of the shoulder joint.  Shoulder pain is her most significant concern.  She is hemodynamically stable at discharge.  Discharge medications tramadol.   Final Clinical Impressions(s) / ED Diagnoses     Final diagnoses:  Chest pain, unspecified type  Left shoulder pain, unspecified chronicity    ED Discharge Orders        Ordered    traMADol (ULTRAM) 50 MG tablet  2 times daily PRN     08/07/17 2018       Donnetta Hutchingook, Whitfield Dulay, MD 08/07/17 2024

## 2017-08-07 NOTE — ED Triage Notes (Signed)
Pt c/o chest pain that radiates to the left shoulder. Pt reports a productive cough and fall in the last few days. Denies shortness of breath/nausea/back pain/diaphoresis.

## 2017-08-07 NOTE — Discharge Instructions (Signed)
X-ray of your left shoulder significant arthritis.  Otherwise tests were good.  Follow-up your primary care doctor.  Prescription for pain medicine.  Recommend heating pad.

## 2017-08-11 ENCOUNTER — Ambulatory Visit (INDEPENDENT_AMBULATORY_CARE_PROVIDER_SITE_OTHER): Payer: Medicare Other | Admitting: Podiatry

## 2017-08-11 ENCOUNTER — Encounter: Payer: Self-pay | Admitting: Podiatry

## 2017-08-11 DIAGNOSIS — M79676 Pain in unspecified toe(s): Secondary | ICD-10-CM | POA: Diagnosis not present

## 2017-08-11 DIAGNOSIS — B351 Tinea unguium: Secondary | ICD-10-CM

## 2017-08-11 DIAGNOSIS — Q828 Other specified congenital malformations of skin: Secondary | ICD-10-CM | POA: Diagnosis not present

## 2017-08-11 NOTE — Progress Notes (Signed)
Complaint:  Visit Type: Patient returns to my office for continued preventative foot care services. Complaint: Patient states" my nails have grown long and thick and become painful to walk and wear shoes" . The patient presents for preventative foot care services. No changes to ROS.  Patient relates having painful callus on the outside of her left foot.  Podiatric Exam: Vascular: dorsalis pedis and posterior tibial pulses are palpable bilateral. Capillary return is immediate. Temperature gradient is WNL. Skin turgor WNL  Sensorium: Normal Semmes Weinstein monofilament test. Normal tactile sensation bilaterally. Nail Exam: Pt has thick disfigured discolored nails with subungual debris noted bilateral entire nail hallux through fifth toenails Ulcer Exam: There is no evidence of ulcer or pre-ulcerative changes or infection. Orthopedic Exam: Muscle tone and strength are WNL. No limitations in general ROM. No crepitus or effusions noted. HAV  B/L Skin:   Symptomatic porokeratosis sub 5th Left.   No infection or ulcers  Diagnosis:  Onychomycosis, , Pain in right toe, pain in left toes. Porokeratosis  Left foot   Treatment & Plan Procedures and Treatment: Consent by patient was obtained for treatment procedures. The patient understood the discussion of treatment and procedures well. All questions were answered thoroughly reviewed. Debridement of mycotic and hypertrophic toenails, 1 through 5 bilateral and clearing of subungual debris. No ulceration, no infection noted. ABN signed for 2019. Return Visit-Office Procedure: Patient instructed to return to the office for a follow up visit 3 months for continued evaluation and treatment.    Helane GuntherGregory Mirissa Lopresti DPM

## 2017-10-20 ENCOUNTER — Ambulatory Visit: Payer: Medicare Other | Admitting: Podiatry

## 2017-12-22 ENCOUNTER — Encounter: Payer: Self-pay | Admitting: Podiatry

## 2017-12-22 ENCOUNTER — Ambulatory Visit (INDEPENDENT_AMBULATORY_CARE_PROVIDER_SITE_OTHER): Payer: Medicare Other | Admitting: Podiatry

## 2017-12-22 DIAGNOSIS — B351 Tinea unguium: Secondary | ICD-10-CM

## 2017-12-22 DIAGNOSIS — M79676 Pain in unspecified toe(s): Secondary | ICD-10-CM | POA: Diagnosis not present

## 2017-12-22 NOTE — Progress Notes (Signed)
Complaint:  Visit Type: Patient returns to my office for continued preventative foot care services. Complaint: Patient states" my nails have grown long and thick and become painful to walk and wear shoes" . The patient presents for preventative foot care services. No changes to ROS.  Patient relates having painful callus on the outside of her left foot.  Podiatric Exam: Vascular: dorsalis pedis and posterior tibial pulses are palpable bilateral. Capillary return is immediate. Temperature gradient is WNL. Skin turgor WNL  Sensorium: Normal Semmes Weinstein monofilament test. Normal tactile sensation bilaterally. Nail Exam: Pt has thick disfigured discolored nails with subungual debris noted bilateral entire nail hallux through fifth toenails Ulcer Exam: There is no evidence of ulcer or pre-ulcerative changes or infection. Orthopedic Exam: Muscle tone and strength are WNL. No limitations in general ROM. No crepitus or effusions noted. HAV  B/L Skin:   Symptomatic porokeratosis sub 5th Left.   No infection or ulcers  Diagnosis:  Onychomycosis, , Pain in right toe, pain in left toes. Porokeratosis  Left foot   Treatment & Plan Procedures and Treatment: Consent by patient was obtained for treatment procedures. The patient understood the discussion of treatment and procedures well. All questions were answered thoroughly reviewed. Debridement of mycotic and hypertrophic toenails, 1 through 5 bilateral and clearing of subungual debris. No ulceration, no infection noted. ABN signed for 2019. Return Visit-Office Procedure: Patient instructed to return to the office for a follow up visit 3 months for continued evaluation and treatment.    Helane GuntherGregory Akshaj Besancon DPM

## 2018-02-23 ENCOUNTER — Ambulatory Visit: Payer: Medicare Other | Admitting: Podiatry

## 2018-04-01 ENCOUNTER — Ambulatory Visit (INDEPENDENT_AMBULATORY_CARE_PROVIDER_SITE_OTHER): Payer: Medicare Other | Admitting: Podiatry

## 2018-04-01 ENCOUNTER — Encounter: Payer: Self-pay | Admitting: Podiatry

## 2018-04-01 DIAGNOSIS — B351 Tinea unguium: Secondary | ICD-10-CM | POA: Diagnosis not present

## 2018-04-01 DIAGNOSIS — M79676 Pain in unspecified toe(s): Secondary | ICD-10-CM | POA: Diagnosis not present

## 2018-04-01 NOTE — Progress Notes (Signed)
Complaint:  Visit Type: Patient returns to my office for continued preventative foot care services. Complaint: Patient states" my nails have grown long and thick and become painful to walk and wear shoes" . The patient presents for preventative foot care services. No changes to ROS.  Patient relates having painful callus on the outside of her left foot.  Podiatric Exam: Vascular: dorsalis pedis and posterior tibial pulses are palpable bilateral. Capillary return is immediate. Temperature gradient is WNL. Skin turgor WNL  Sensorium: Normal Semmes Weinstein monofilament test. Normal tactile sensation bilaterally. Nail Exam: Pt has thick disfigured discolored nails with subungual debris noted bilateral entire nail hallux through fifth toenails Ulcer Exam: There is no evidence of ulcer or pre-ulcerative changes or infection. Orthopedic Exam: Muscle tone and strength are WNL. No limitations in general ROM. No crepitus or effusions noted. HAV  B/L Skin:   Symptomatic porokeratosis sub 5th Left.   No infection or ulcers  Diagnosis:  Onychomycosis, , Pain in right toe, pain in left toes.   Treatment & Plan Procedures and Treatment: Consent by patient was obtained for treatment procedures. The patient understood the discussion of treatment and procedures well. All questions were answered thoroughly reviewed. Debridement of mycotic and hypertrophic toenails, 1 through 5 bilateral and clearing of subungual debris. No ulceration, no infection noted. ABN signed for 2019. Return Visit-Office Procedure: Patient instructed to return to the office for a follow up visit 3 months for continued evaluation and treatment.    Helane GuntherGregory Jancie Kercher DPM

## 2018-06-15 ENCOUNTER — Ambulatory Visit: Payer: Medicare Other | Admitting: Podiatry

## 2018-06-22 ENCOUNTER — Encounter: Payer: Self-pay | Admitting: Podiatry

## 2018-06-22 ENCOUNTER — Ambulatory Visit (INDEPENDENT_AMBULATORY_CARE_PROVIDER_SITE_OTHER): Payer: Medicare Other | Admitting: Podiatry

## 2018-06-22 DIAGNOSIS — Q828 Other specified congenital malformations of skin: Secondary | ICD-10-CM | POA: Diagnosis not present

## 2018-06-22 DIAGNOSIS — M79676 Pain in unspecified toe(s): Secondary | ICD-10-CM | POA: Diagnosis not present

## 2018-06-22 DIAGNOSIS — B351 Tinea unguium: Secondary | ICD-10-CM | POA: Diagnosis not present

## 2018-06-22 NOTE — Progress Notes (Signed)
Complaint:  Visit Type: Patient returns to my office for continued preventative foot care services. Complaint: Patient states" my nails have grown long and thick and become painful to walk and wear shoes" . The patient presents for preventative foot care services. No changes to ROS.  Patient relates having painful callus on the outside of her left foot.  Podiatric Exam: Vascular: dorsalis pedis and posterior tibial pulses are palpable bilateral. Capillary return is immediate. Temperature gradient is WNL. Skin turgor WNL  Sensorium: Normal Semmes Weinstein monofilament test. Normal tactile sensation bilaterally. Nail Exam: Pt has thick disfigured discolored nails with subungual debris noted bilateral entire nail hallux through fifth toenails Ulcer Exam: There is no evidence of ulcer or pre-ulcerative changes or infection. Orthopedic Exam: Muscle tone and strength are WNL. No limitations in general ROM. No crepitus or effusions noted. HAV  B/L Skin:   Symptomatic porokeratosis sub 5th Left.   No infection or ulcers  Diagnosis:  Onychomycosis, , Pain in right toe, pain in left toes. Porokeratosis left foot sub 5th   Treatment & Plan Procedures and Treatment: Consent by patient was obtained for treatment procedures. The patient understood the discussion of treatment and procedures well. All questions were answered thoroughly reviewed. Debridement of mycotic and hypertrophic toenails, 1 through 5 bilateral and clearing of subungual debris. No ulceration, no infection noted. Debride porokeratosis left foot. ABN signed for 2019. Return Visit-Office Procedure: Patient instructed to return to the office for a follow up visit 3 months for continued evaluation and treatment.    Helane GuntherGregory Ramsay Bognar DPM

## 2018-09-21 ENCOUNTER — Ambulatory Visit (INDEPENDENT_AMBULATORY_CARE_PROVIDER_SITE_OTHER): Payer: Medicare Other | Admitting: Podiatry

## 2018-09-21 ENCOUNTER — Encounter: Payer: Self-pay | Admitting: Podiatry

## 2018-09-21 DIAGNOSIS — M79676 Pain in unspecified toe(s): Secondary | ICD-10-CM

## 2018-09-21 DIAGNOSIS — B351 Tinea unguium: Secondary | ICD-10-CM

## 2018-09-21 NOTE — Progress Notes (Signed)
Complaint:  Visit Type: Patient returns to my office for continued preventative foot care services. Complaint: Patient states" my nails have grown long and thick and become painful to walk and wear shoes" . The patient presents for preventative foot care services. No changes to ROS.  Patient relates having painful callus on the outside of her left foot.  Podiatric Exam: Vascular: dorsalis pedis and posterior tibial pulses are palpable bilateral. Capillary return is immediate. Temperature gradient is WNL. Skin turgor WNL  Sensorium: Normal Semmes Weinstein monofilament test. Normal tactile sensation bilaterally. Nail Exam: Pt has thick disfigured discolored nails with subungual debris noted bilateral entire nail hallux through fifth toenails Ulcer Exam: There is no evidence of ulcer or pre-ulcerative changes or infection. Orthopedic Exam: Muscle tone and strength are WNL. No limitations in general ROM. No crepitus or effusions noted. HAV  B/L Skin:   .   No infection or ulcers.  Resolved porokeratosis  Diagnosis:  Onychomycosis, , Pain in right toe, pain in left toes.   Treatment & Plan Procedures and Treatment: Consent by patient was obtained for treatment procedures. The patient understood the discussion of treatment and procedures well. All questions were answered thoroughly reviewed. Debridement of mycotic and hypertrophic toenails, 1 through 5 bilateral and clearing of subungual debris. No ulceration, no infection noted.  Return Visit-Office Procedure: Patient instructed to return to the office for a follow up visit 10 weeks  for continued evaluation and treatment.    Helane Gunther DPM

## 2018-11-30 ENCOUNTER — Encounter: Payer: Self-pay | Admitting: Podiatry

## 2018-11-30 ENCOUNTER — Other Ambulatory Visit: Payer: Self-pay

## 2018-11-30 ENCOUNTER — Ambulatory Visit (INDEPENDENT_AMBULATORY_CARE_PROVIDER_SITE_OTHER): Payer: Medicare Other | Admitting: Podiatry

## 2018-11-30 VITALS — Temp 97.5°F

## 2018-11-30 DIAGNOSIS — M79676 Pain in unspecified toe(s): Secondary | ICD-10-CM | POA: Diagnosis not present

## 2018-11-30 DIAGNOSIS — B351 Tinea unguium: Secondary | ICD-10-CM

## 2018-11-30 NOTE — Progress Notes (Signed)
Complaint:  Visit Type: Patient returns to my office for continued preventative foot care services. Complaint: Patient states" my nails have grown long and thick and become painful to walk and wear shoes" . The patient presents for preventative foot care services. No changes to ROS.  Patient relates having painful callus on the outside of her left foot.  Podiatric Exam: Vascular: dorsalis pedis and posterior tibial pulses are palpable bilateral. Capillary return is immediate. Temperature gradient is WNL. Skin turgor WNL  Sensorium: Normal Semmes Weinstein monofilament test. Normal tactile sensation bilaterally. Nail Exam: Pt has thick disfigured discolored nails with subungual debris noted bilateral entire nail hallux through fifth toenails Ulcer Exam: There is no evidence of ulcer or pre-ulcerative changes or infection. Orthopedic Exam: Muscle tone and strength are WNL. No limitations in general ROM. No crepitus or effusions noted. HAV  B/L Skin:   .   No infection or ulcers.  Resolved porokeratosis  Diagnosis:  Onychomycosis, , Pain in right toe, pain in left toes.   Treatment & Plan Procedures and Treatment: Consent by patient was obtained for treatment procedures. The patient understood the discussion of treatment and procedures well. All questions were answered thoroughly reviewed. Debridement of mycotic and hypertrophic toenails, 1 through 5 bilateral and clearing of subungual debris. No ulceration, no infection noted.  Return Visit-Office Procedure: Patient instructed to return to the office for a follow up visit 10 weeks  for continued evaluation and treatment.    Helane Gunther DPM

## 2019-02-10 ENCOUNTER — Ambulatory Visit: Payer: Medicare Other | Admitting: Podiatry

## 2019-03-17 ENCOUNTER — Encounter: Payer: Self-pay | Admitting: Podiatry

## 2019-03-17 ENCOUNTER — Other Ambulatory Visit: Payer: Self-pay

## 2019-03-17 ENCOUNTER — Ambulatory Visit (INDEPENDENT_AMBULATORY_CARE_PROVIDER_SITE_OTHER): Payer: Medicare Other | Admitting: Podiatry

## 2019-03-17 DIAGNOSIS — B351 Tinea unguium: Secondary | ICD-10-CM | POA: Diagnosis not present

## 2019-03-17 DIAGNOSIS — M79676 Pain in unspecified toe(s): Secondary | ICD-10-CM | POA: Diagnosis not present

## 2019-03-17 NOTE — Progress Notes (Signed)
Complaint:  Visit Type: Patient returns to my office for continued preventative foot care services. Complaint: Patient states" my nails have grown long and thick and become painful to walk and wear shoes" . The patient presents for preventative foot care services. No changes to ROS.    Podiatric Exam: Vascular: dorsalis pedis and posterior tibial pulses are palpable bilateral. Capillary return is immediate. Temperature gradient is WNL. Skin turgor WNL  Sensorium: Normal Semmes Weinstein monofilament test. Normal tactile sensation bilaterally. Nail Exam: Pt has thick disfigured discolored nails with subungual debris noted bilateral entire nail hallux through fifth toenails Ulcer Exam: There is no evidence of ulcer or pre-ulcerative changes or infection. Orthopedic Exam: Muscle tone and strength are WNL. No limitations in general ROM. No crepitus or effusions noted. HAV  B/L Skin:   .   No infection or ulcers.  Resolved porokeratosis  Diagnosis:  Onychomycosis, , Pain in right toe, pain in left toes.   Treatment & Plan Procedures and Treatment: Consent by patient was obtained for treatment procedures. The patient understood the discussion of treatment and procedures well. All questions were answered thoroughly reviewed. Debridement of mycotic and hypertrophic toenails, 1 through 5 bilateral and clearing of subungual debris. No ulceration, no infection noted.  Return Visit-Office Procedure: Patient instructed to return to the office for a follow up visit 10 weeks  for continued evaluation and treatment.    Gardiner Barefoot DPM

## 2019-06-23 ENCOUNTER — Ambulatory Visit (INDEPENDENT_AMBULATORY_CARE_PROVIDER_SITE_OTHER): Payer: Medicare Other | Admitting: Podiatry

## 2019-06-23 ENCOUNTER — Other Ambulatory Visit: Payer: Self-pay

## 2019-06-23 ENCOUNTER — Encounter: Payer: Self-pay | Admitting: Podiatry

## 2019-06-23 DIAGNOSIS — B351 Tinea unguium: Secondary | ICD-10-CM

## 2019-06-23 DIAGNOSIS — M79676 Pain in unspecified toe(s): Secondary | ICD-10-CM | POA: Diagnosis not present

## 2019-06-23 NOTE — Progress Notes (Signed)
Complaint:  Visit Type: Patient returns to my office for continued preventative foot care services. Complaint: Patient states" my nails have grown long and thick and become painful to walk and wear shoes" . The patient presents for preventative foot care services. No changes to ROS.    Podiatric Exam: Vascular: dorsalis pedis and posterior tibial pulses are palpable bilateral. Capillary return is immediate. Temperature gradient is WNL. Skin turgor WNL  Sensorium: Normal Semmes Weinstein monofilament test. Normal tactile sensation bilaterally. Nail Exam: Pt has thick disfigured discolored nails with subungual debris noted bilateral entire nail hallux through fifth toenails Ulcer Exam: There is no evidence of ulcer or pre-ulcerative changes or infection. Orthopedic Exam: Muscle tone and strength are WNL. No limitations in general ROM. No crepitus or effusions noted. HAV  B/L Skin:   .   No infection or ulcers. No  porokeratosis  Diagnosis:  Onychomycosis, , Pain in right toe, pain in left toes.   Treatment & Plan Procedures and Treatment: Consent by patient was obtained for treatment procedures. The patient understood the discussion of treatment and procedures well. All questions were answered thoroughly reviewed. Debridement of mycotic and hypertrophic toenails, 1 through 5 bilateral and clearing of subungual debris. No ulceration, no infection noted.  Return Visit-Office Procedure: Patient instructed to return to the office for a follow up visit 10 weeks  for continued evaluation and treatment.    Kaleigha Chamberlin DPM 

## 2019-09-01 ENCOUNTER — Ambulatory Visit: Payer: Medicare Other | Admitting: Podiatry

## 2019-09-08 ENCOUNTER — Ambulatory Visit (INDEPENDENT_AMBULATORY_CARE_PROVIDER_SITE_OTHER): Payer: Medicare Other | Admitting: Podiatry

## 2019-09-08 ENCOUNTER — Ambulatory Visit: Payer: Medicare Other | Admitting: Podiatry

## 2019-09-08 ENCOUNTER — Other Ambulatory Visit: Payer: Self-pay

## 2019-09-08 ENCOUNTER — Encounter: Payer: Self-pay | Admitting: Podiatry

## 2019-09-08 DIAGNOSIS — M79676 Pain in unspecified toe(s): Secondary | ICD-10-CM

## 2019-09-08 DIAGNOSIS — B351 Tinea unguium: Secondary | ICD-10-CM | POA: Diagnosis not present

## 2019-09-08 NOTE — Progress Notes (Signed)
Complaint:  Visit Type: Patient returns to my office for continued preventative foot care services. Complaint: Patient states" my nails have grown long and thick and become painful to walk and wear shoes" . The patient presents for preventative foot care services. No changes to ROS.    Podiatric Exam: Vascular: dorsalis pedis and posterior tibial pulses are palpable bilateral. Capillary return is immediate. Temperature gradient is WNL. Skin turgor WNL  Sensorium: Normal Semmes Weinstein monofilament test. Normal tactile sensation bilaterally. Nail Exam: Pt has thick disfigured discolored nails with subungual debris noted bilateral entire nail hallux through fifth toenails Ulcer Exam: There is no evidence of ulcer or pre-ulcerative changes or infection. Orthopedic Exam: Muscle tone and strength are WNL. No limitations in general ROM. No crepitus or effusions noted. HAV  B/L Skin:   .   No infection or ulcers. No  porokeratosis  Diagnosis:  Onychomycosis, , Pain in right toe, pain in left toes.   Treatment & Plan Procedures and Treatment: Consent by patient was obtained for treatment procedures. The patient understood the discussion of treatment and procedures well. All questions were answered thoroughly reviewed. Debridement of mycotic and hypertrophic toenails, 1 through 5 bilateral and clearing of subungual debris. No ulceration, no infection noted.  Return Visit-Office Procedure: Patient instructed to return to the office for a follow up visit 10 weeks  for continued evaluation and treatment.    Helane Gunther DPM

## 2019-11-24 ENCOUNTER — Other Ambulatory Visit: Payer: Self-pay

## 2019-11-24 ENCOUNTER — Encounter: Payer: Self-pay | Admitting: Podiatry

## 2019-11-24 ENCOUNTER — Ambulatory Visit (INDEPENDENT_AMBULATORY_CARE_PROVIDER_SITE_OTHER): Payer: Medicare Other | Admitting: Podiatry

## 2019-11-24 DIAGNOSIS — M79676 Pain in unspecified toe(s): Secondary | ICD-10-CM | POA: Diagnosis not present

## 2019-11-24 DIAGNOSIS — B351 Tinea unguium: Secondary | ICD-10-CM | POA: Diagnosis not present

## 2019-11-24 NOTE — Progress Notes (Signed)
This patient presents to the office with chief complaint of long thick painful nails.  Patient says the nails are painful walking and wearing shoes.  This patient is unable to self treat.  This patient is unable to trim her nails since she is unable to reach her nails.  She presents to the office for preventative foot care services. She presents to the office with her son.  General Appearance  Alert, conversant and in no acute stress.  Vascular  Dorsalis pedis and posterior tibial  pulses are weakly  palpable  bilaterally.  Capillary return is within normal limits  bilaterally. Temperature is within normal limits  bilaterally.  Neurologic  Senn-Weinstein monofilament wire test within normal limits  bilaterally. Muscle power within normal limits bilaterally.  Nails Thick disfigured discolored nails with subungual debris  from hallux to fifth toes bilaterally. No evidence of bacterial infection or drainage bilaterally.  Orthopedic  No limitations of motion  feet .  No crepitus or effusions noted.  No bony pathology or digital deformities noted.  HAV  B/L.  Skin  normotropic skin with no porokeratosis noted bilaterally.  No signs of infections or ulcers noted.     Onychomycosis  Nails  B/L.  Pain in right toes  Pain in left toes  Debridement of nails both feet followed trimming the nails with dremel tool.      RTC  10 weeks    Rahima Fleishman DPM  

## 2019-12-14 ENCOUNTER — Other Ambulatory Visit: Payer: Self-pay | Admitting: Cardiovascular Disease

## 2019-12-14 ENCOUNTER — Ambulatory Visit
Admission: RE | Admit: 2019-12-14 | Discharge: 2019-12-14 | Disposition: A | Discharge status: Home / Self Care | Payer: Medicare Other | Source: Ambulatory Visit | Attending: Cardiovascular Disease | Admitting: Cardiovascular Disease

## 2019-12-14 DIAGNOSIS — R0789 Other chest pain: Secondary | ICD-10-CM

## 2020-01-02 ENCOUNTER — Emergency Department (HOSPITAL_COMMUNITY)
Admission: EM | Admit: 2020-01-02 | Discharge: 2020-01-02 | Disposition: A | Discharge status: Home / Self Care | Payer: Medicare Other | Attending: Emergency Medicine | Admitting: Emergency Medicine

## 2020-01-02 ENCOUNTER — Other Ambulatory Visit: Payer: Self-pay

## 2020-01-02 ENCOUNTER — Encounter (HOSPITAL_COMMUNITY): Payer: Self-pay | Admitting: Emergency Medicine

## 2020-01-02 ENCOUNTER — Emergency Department (HOSPITAL_COMMUNITY): Payer: Medicare Other

## 2020-01-02 DIAGNOSIS — I10 Essential (primary) hypertension: Secondary | ICD-10-CM | POA: Diagnosis not present

## 2020-01-02 DIAGNOSIS — Z87891 Personal history of nicotine dependence: Secondary | ICD-10-CM | POA: Insufficient documentation

## 2020-01-02 DIAGNOSIS — M436 Torticollis: Secondary | ICD-10-CM | POA: Diagnosis not present

## 2020-01-02 DIAGNOSIS — Z7982 Long term (current) use of aspirin: Secondary | ICD-10-CM | POA: Diagnosis not present

## 2020-01-02 DIAGNOSIS — M542 Cervicalgia: Secondary | ICD-10-CM | POA: Diagnosis present

## 2020-01-02 DIAGNOSIS — Z79899 Other long term (current) drug therapy: Secondary | ICD-10-CM | POA: Insufficient documentation

## 2020-01-02 MED ORDER — LIDOCAINE 5 % EX PTCH
1.0000 | MEDICATED_PATCH | CUTANEOUS | Status: DC
  Administered 2020-01-02: 1 via TRANSDERMAL
  Filled 2020-01-02: qty 1

## 2020-01-02 MED ORDER — DIAZEPAM 5 MG PO TABS
5.0000 mg | ORAL_TABLET | Freq: Once | ORAL | Status: AC
  Administered 2020-01-02: 5 mg via ORAL
  Filled 2020-01-02: qty 1

## 2020-01-02 MED ORDER — ACETAMINOPHEN 500 MG PO TABS
1000.0000 mg | ORAL_TABLET | Freq: Once | ORAL | Status: AC
Start: 2020-01-02 — End: 2020-01-02
  Administered 2020-01-02: 1000 mg via ORAL
  Filled 2020-01-02: qty 2

## 2020-01-02 NOTE — ED Notes (Signed)
An After Visit Summary was printed and given to the patient. Discharge instructions given and no further questions at this time.  Pt leaving with husband. 

## 2020-01-02 NOTE — ED Provider Notes (Signed)
[facility]-EMERGENCY DEPT Provider Note   CSN: [id number] Arrival date & time: 01/02/20  1502     History Chief Complaint  Patient presents with  . Neck Pain  . Headache    [name] is a 86 y.o. female.  86 year old female with past medical history including hypertension who presents with neck pain.  For about the past week, the patient has had left-sided neck pain that is worse when she tries to turn her head or sit up.  She is able to turn her head to the right but cannot turn her head to the left due to pain.  Sometimes the neck pain causes a pulling sensation on the L side of her head. She denies any preceding trauma or injury.  She denies any recent chiropractic manipulation.  No arm weakness/numbness.  No fevers or recent illness.  She has tried taking Tylenol without relief.  The history is provided by the patient.  Neck Pain Associated symptoms: headaches   Headache Associated symptoms: neck pain        Past Medical History:  Diagnosis Date  . Hypertension     There are no problems to display for this patient.   History reviewed. No pertinent surgical history.   OB History   No obstetric history on file.     No family history on file.  Social History   Tobacco Use  . Smoking status: Former Games developer  . Smokeless tobacco: Never Used  Substance Use Topics  . Alcohol use: No  . Drug use: No    Home Medications Prior to Admission medications   Medication Sig Start Date End Date Taking? Authorizing Provider  aspirin EC 81 MG tablet Take 81 mg by mouth daily. Swallow whole.   Yes [provider]  atorvastatin (LIPITOR) 20 MG tablet Take 20 mg by mouth daily.   Yes [provider]  losartan (COZAAR) 50 MG tablet Take 50 mg by mouth daily. 10/26/16  Yes [provider]  metoprolol tartrate (LOPRESSOR) 25 MG tablet Take 50 mg by mouth 2 (two) times daily.  09/13/19  Yes [provider]  tiZANidine  (ZANAFLEX) 2 MG tablet Take 1 mg by mouth at bedtime as needed for muscle spasms.  12/27/19  Yes [provider]  diltiazem (CARDIZEM) 30 MG tablet Take 30 mg by mouth 2 (two) times daily. 01/02/20   [provider]  oxyCODONE (OXY IR/ROXICODONE) 5 MG immediate release tablet  01/02/20   [provider]  traMADol (ULTRAM) 50 MG tablet Take 1 tablet (50 mg total) by mouth 2 (two) times daily as needed. Patient not taking: Reported on 01/02/2020 08/07/17   Donnetta Hutching, MD    Allergies    Patient has no known allergies.  Review of Systems   Review of Systems  Musculoskeletal: Positive for neck pain.  Neurological: Positive for headaches.   All other systems reviewed and are negative except that which was mentioned in HPI  Physical Exam Updated Vital Signs BP 114/68   Pulse 85   Temp 98 F (36.7 C) (Oral)   Resp 18   SpO2 97%   Physical Exam Vitals and nursing note reviewed.  Constitutional:      General: She is not in acute distress.    Appearance: She is well-developed.  HENT:     Head: Normocephalic and atraumatic.     Mouth/Throat:     Mouth: Mucous membranes are moist.     Pharynx: Oropharynx is clear.  Eyes:     Conjunctiva/sclera: Conjunctivae normal.  Neck:     Comments: Head turned to R, able to turn to R but stops at midline and refuses to turn L 2/2 pain; tenderness on lower L paraspinal cervical muscles into L trapezius muscle; no neck swelling or asymmetry Musculoskeletal:     Cervical back: No rigidity.  Skin:    General: Skin is warm and dry.  Neurological:     Mental Status: She is alert and oriented to person, place, and time.     Cranial Nerves: No cranial nerve deficit.     Comments: Fluent speech, normal strength and sensation BUE  Psychiatric:        Mood and Affect: Mood normal.        Speech: Speech normal.        Judgment: Judgment normal.     ED Results / Procedures / Treatments   Labs (all labs ordered are listed, but  only abnormal results are displayed) Labs Reviewed - No data to display  EKG None  Radiology CT Cervical Spine Wo Contrast  Result Date: 01/02/2020 CLINICAL DATA:  Left-sided neck pain, decreased range of motion EXAM: CT CERVICAL SPINE WITHOUT CONTRAST TECHNIQUE: Multidetector CT imaging of the cervical spine was performed without intravenous contrast. Multiplanar CT image reconstructions were also generated. COMPARISON:  X-ray 05/24/2007 FINDINGS: Alignment: Facet joints are aligned without dislocation. Dens and lateral masses are aligned. 3 mm anterolisthesis C2 on C3 and C3 on C4. Reversal of the cervical lordosis. Skull base and vertebrae: No acute fracture. No primary bone lesion or focal pathologic process. Bony structures appear demineralized. Soft tissues and spinal canal: No prevertebral fluid or swelling. No visible canal hematoma. Disc levels: Multilevel advanced intervertebral disc height loss and mild degenerative endplate spurring. Bilateral facet joint arthropathy is most severe at C2-3 and C3-4. Upper chest: Visualized lung apices are clear. Other: Bilateral carotid atherosclerosis. IMPRESSION: 1. No acute fracture or traumatic listhesis of the cervical spine. 2. Multilevel advanced degenerative disc disease and facet joint arthropathy. Electronically Signed   By: Davina Poke D.O.   On: 01/02/2020 16:58    Procedures Procedures (including critical care time)  Medications Ordered in ED Medications  lidocaine (LIDODERM) 5 % 1 patch (1 patch Transdermal Patch Applied 01/02/20 1554)  diazepam (VALIUM) tablet 5 mg (5 mg Oral Given 01/02/20 1553)  acetaminophen (TYLENOL) tablet 1,000 mg (1,000 mg Oral Given 01/02/20 1554)    ED Course  I have reviewed the triage vital signs and the nursing notes.  Pertinent imaging results that were available during my care of the patient were reviewed by me and considered in my medical decision making (see chart for details).    MDM  Rules/Calculators/A&P                          Exam and sx very suggestive of torticollis/muscle spasm. No swelling or asymmetry to suggest infection. No trauma or neuro symptoms to suggest artery dissection. Because of age, obtained CT c-spine to evaluate for acute bony process. CT negative acute.  Discussed supportive measures including ROM exercises, heat therapy and massage, lidocaine patches, scheduled tylenol. Gave single dose of valium here. Reviewed return precautions.  Final Clinical Impression(s) / ED Diagnoses Final diagnoses:  Torticollis    Rx / DC Orders ED Discharge Orders    None       Kyndall Chaplin, Wenda Overland, MD 01/02/20 1745

## 2020-01-02 NOTE — Discharge Instructions (Addendum)
Apply heating pad to neck several times a day (do not sleep on it) and massage neck muscles. Move neck around as much as you can to stretch the muscles and improve pain. You can buy lidocaine patches at any pharmacy. Take tylenol 1000mg  every 6 to 8 hours as needed for pain.

## 2020-01-02 NOTE — ED Triage Notes (Signed)
Patient here from home reporting left sided neck pain radiating up into head that started last week. Reports that she is unable to bend or turn without pain. Ambulatory with walker. AAO x4.

## 2020-02-02 ENCOUNTER — Other Ambulatory Visit: Payer: Self-pay

## 2020-02-02 ENCOUNTER — Encounter: Payer: Self-pay | Admitting: Podiatry

## 2020-02-02 ENCOUNTER — Ambulatory Visit (INDEPENDENT_AMBULATORY_CARE_PROVIDER_SITE_OTHER): Payer: Medicare Other | Admitting: Podiatry

## 2020-02-02 ENCOUNTER — Ambulatory Visit: Payer: Medicare Other | Admitting: Podiatry

## 2020-02-02 DIAGNOSIS — B351 Tinea unguium: Secondary | ICD-10-CM | POA: Diagnosis not present

## 2020-02-02 DIAGNOSIS — M79676 Pain in unspecified toe(s): Secondary | ICD-10-CM

## 2020-02-02 NOTE — Progress Notes (Signed)
This patient presents to the office with chief complaint of long thick painful nails.  Patient says the nails are painful walking and wearing shoes.  This patient is unable to self treat.  This patient is unable to trim her nails since she is unable to reach her nails.  She presents to the office for preventative foot care services. She presents to the office with her son.  General Appearance  Alert, conversant and in no acute stress.  Vascular  Dorsalis pedis and posterior tibial  pulses are weakly  palpable  bilaterally.  Capillary return is within normal limits  bilaterally. Temperature is within normal limits  bilaterally.  Neurologic  Senn-Weinstein monofilament wire test within normal limits  bilaterally. Muscle power within normal limits bilaterally.  Nails Thick disfigured discolored nails with subungual debris  from hallux to fifth toes bilaterally. No evidence of bacterial infection or drainage bilaterally.  Orthopedic  No limitations of motion  feet .  No crepitus or effusions noted.  No bony pathology or digital deformities noted.  HAV  B/L.  Skin  normotropic skin with no porokeratosis noted bilaterally.  No signs of infections or ulcers noted.     Onychomycosis  Nails  B/L.  Pain in right toes  Pain in left toes  Debridement of nails both feet followed trimming the nails with dremel tool.    Patient talks with Fenton Foy about brace for right foot.  RTC  10 weeks    Helane Gunther DPM

## 2020-05-10 ENCOUNTER — Ambulatory Visit (INDEPENDENT_AMBULATORY_CARE_PROVIDER_SITE_OTHER): Payer: Medicare Other | Admitting: Podiatry

## 2020-05-10 ENCOUNTER — Other Ambulatory Visit: Payer: Self-pay

## 2020-05-10 ENCOUNTER — Encounter: Payer: Self-pay | Admitting: Podiatry

## 2020-05-10 DIAGNOSIS — M79676 Pain in unspecified toe(s): Secondary | ICD-10-CM | POA: Diagnosis not present

## 2020-05-10 DIAGNOSIS — B351 Tinea unguium: Secondary | ICD-10-CM | POA: Diagnosis not present

## 2020-05-10 NOTE — Progress Notes (Signed)
This patient presents to the office with chief complaint of long thick painful nails.  Patient says the nails are painful walking and wearing shoes.  This patient is unable to self treat.  This patient is unable to trim her nails since she is unable to reach her nails.  She presents to the office for preventative foot care services. She presents to the office with her son.  General Appearance  Alert, conversant and in no acute stress.  Vascular  Dorsalis pedis and posterior tibial  pulses are weakly  palpable  bilaterally.  Capillary return is within normal limits  bilaterally. Temperature is within normal limits  bilaterally.  Neurologic  Senn-Weinstein monofilament wire test within normal limits  bilaterally. Muscle power within normal limits bilaterally.  Nails Thick disfigured discolored nails with subungual debris  from hallux to fifth toes bilaterally. No evidence of bacterial infection or drainage bilaterally.  Orthopedic  No limitations of motion  feet .  No crepitus or effusions noted.  No bony pathology or digital deformities noted.  HAV  B/L.  Skin  normotropic skin with no porokeratosis noted bilaterally.  No signs of infections or ulcers noted.     Onychomycosis  Nails  B/L.  Pain in right toes  Pain in left toes  Debridement of nails both feet followed trimming the nails with dremel tool.      RTC  10 weeks    Helane Gunther DPM

## 2020-08-01 ENCOUNTER — Ambulatory Visit (INDEPENDENT_AMBULATORY_CARE_PROVIDER_SITE_OTHER): Payer: Medicare Other | Admitting: Podiatry

## 2020-08-01 ENCOUNTER — Other Ambulatory Visit: Payer: Self-pay

## 2020-08-01 ENCOUNTER — Encounter: Payer: Self-pay | Admitting: Podiatry

## 2020-08-01 DIAGNOSIS — B351 Tinea unguium: Secondary | ICD-10-CM

## 2020-08-01 DIAGNOSIS — M79676 Pain in unspecified toe(s): Secondary | ICD-10-CM | POA: Diagnosis not present

## 2020-08-01 NOTE — Progress Notes (Signed)
This patient presents to the office with chief complaint of long thick painful nails.  Patient says the nails are painful walking and wearing shoes.  This patient is unable to self treat.  This patient is unable to trim her nails since she is unable to reach her nails.  She presents to the office for preventative foot care services.   She presents to the office with her daughter..  General Appearance  Alert, conversant and in no acute stress.  Vascular  Dorsalis pedis and posterior tibial  pulses are weakly  palpable  bilaterally.  Capillary return is within normal limits  bilaterally. Temperature is within normal limits  bilaterally.  Neurologic  Senn-Weinstein monofilament wire test within normal limits  bilaterally. Muscle power within normal limits bilaterally.  Nails Thick disfigured discolored nails with subungual debris  from hallux to fifth toes bilaterally. No evidence of bacterial infection or drainage bilaterally.  Orthopedic  No limitations of motion  feet .  No crepitus or effusions noted.  No bony pathology or digital deformities noted.  HAV  B/L.  Hammer toes  B/L.  Skin  normotropic skin with no porokeratosis noted bilaterally.  No signs of infections or ulcers noted.     Onychomycosis  Nails  B/L.  Pain in right toes  Pain in left toes  Debridement of nails both feet followed trimming the nails with dremel tool.  Padding dispensed on second toe right foot.    RTC  10 weeks    Helane Gunther DPM

## 2020-08-23 ENCOUNTER — Ambulatory Visit: Payer: Medicare Other | Admitting: Podiatry

## 2020-10-17 ENCOUNTER — Encounter (HOSPITAL_COMMUNITY): Payer: Self-pay

## 2020-10-17 ENCOUNTER — Emergency Department (HOSPITAL_COMMUNITY): Payer: Medicare Other

## 2020-10-17 ENCOUNTER — Emergency Department (HOSPITAL_COMMUNITY)
Admission: EM | Admit: 2020-10-17 | Discharge: 2020-10-17 | Disposition: A | Discharge status: Home / Self Care | Payer: Medicare Other | Attending: Emergency Medicine | Admitting: Emergency Medicine

## 2020-10-17 DIAGNOSIS — Z87891 Personal history of nicotine dependence: Secondary | ICD-10-CM | POA: Insufficient documentation

## 2020-10-17 DIAGNOSIS — Z79899 Other long term (current) drug therapy: Secondary | ICD-10-CM | POA: Diagnosis not present

## 2020-10-17 DIAGNOSIS — I10 Essential (primary) hypertension: Secondary | ICD-10-CM | POA: Diagnosis not present

## 2020-10-17 DIAGNOSIS — M79602 Pain in left arm: Secondary | ICD-10-CM | POA: Diagnosis present

## 2020-10-17 DIAGNOSIS — I4811 Longstanding persistent atrial fibrillation: Secondary | ICD-10-CM | POA: Diagnosis not present

## 2020-10-17 DIAGNOSIS — R059 Cough, unspecified: Secondary | ICD-10-CM | POA: Insufficient documentation

## 2020-10-17 DIAGNOSIS — G8929 Other chronic pain: Secondary | ICD-10-CM | POA: Insufficient documentation

## 2020-10-17 DIAGNOSIS — Z7982 Long term (current) use of aspirin: Secondary | ICD-10-CM | POA: Diagnosis not present

## 2020-10-17 DIAGNOSIS — I4891 Unspecified atrial fibrillation: Secondary | ICD-10-CM

## 2020-10-17 DIAGNOSIS — M25512 Pain in left shoulder: Secondary | ICD-10-CM | POA: Insufficient documentation

## 2020-10-17 LAB — BASIC METABOLIC PANEL
Anion gap: 11 (ref 5–15)
BUN: 18 mg/dL (ref 8–23)
CO2: 25 mmol/L (ref 22–32)
Calcium: 9.9 mg/dL (ref 8.9–10.3)
Chloride: 102 mmol/L (ref 98–111)
Creatinine, Ser: 1.36 mg/dL — ABNORMAL HIGH (ref 0.44–1.00)
GFR, Estimated: 35 mL/min — ABNORMAL LOW (ref >60)
Glucose, Bld: 101 mg/dL — ABNORMAL HIGH (ref 70–99)
Potassium: 3.9 mmol/L (ref 3.5–5.1)
Sodium: 138 mmol/L (ref 135–145)

## 2020-10-17 LAB — CBC WITH DIFFERENTIAL/PLATELET
Abs Immature Granulocytes: 0.01 10*3/uL (ref 0.00–0.07)
Basophils Absolute: 0 10*3/uL (ref 0.0–0.1)
Basophils Relative: 1 %
Eosinophils Absolute: 0.1 10*3/uL (ref 0.0–0.5)
Eosinophils Relative: 1 %
HCT: 43.6 % (ref 36.0–46.0)
Hemoglobin: 14 g/dL (ref 12.0–15.0)
Immature Granulocytes: 0 %
Lymphocytes Relative: 19 %
Lymphs Abs: 1.1 10*3/uL (ref 0.7–4.0)
MCH: 28.1 pg (ref 26.0–34.0)
MCHC: 32.1 g/dL (ref 30.0–36.0)
MCV: 87.6 fL (ref 80.0–100.0)
Monocytes Absolute: 0.7 10*3/uL (ref 0.1–1.0)
Monocytes Relative: 12 %
Neutro Abs: 3.8 10*3/uL (ref 1.7–7.7)
Neutrophils Relative %: 67 %
Platelets: 239 10*3/uL (ref 150–400)
RBC: 4.98 MIL/uL (ref 3.87–5.11)
RDW: 14.4 % (ref 11.5–15.5)
WBC: 5.7 10*3/uL (ref 4.0–10.5)
nRBC: 0 % (ref 0.0–0.2)

## 2020-10-17 NOTE — Discharge Instructions (Addendum)
For the pain in your shoulder and chest take Tylenol every 4 hours.  Try using heat on your shoulder to help the discomfort.  Call your cardiologist for a follow-up appointment to be seen in the next few days.

## 2020-10-17 NOTE — ED Notes (Signed)
Pt attached to cardiac monitor x3 on arrival. EKG obtained and given to Dr. Effie Shy at this time. VSS stable.

## 2020-10-17 NOTE — ED Provider Notes (Signed)
[facility]-EMERGENCY DEPT Provider Note   CSN: [id number] Arrival date & time: 10/17/20  [id number]     History Chief Complaint  Patient presents with  . Shoulder Pain    Left shoulder and left arm pain; chronic     Sherry [name] is a 87 y.o. female.  HPI Patient here by EMS for evaluation of multiple problems.  This morning family members, daughter, were worried that she was having heart attack because she was having left arm pain, and coughing up sputum.  Also at one point her left arm seem to not move, and was numb.  Since then she has been moving her left arm better.  Patient reports ongoing cough with sputum production for 1 month and she is talked to her PCP about it, and he plans on referring her to a specialist at some point.  That referral has not been yet made.  There is been no recent fever.  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  Left shoulder x-ray, 3 years ago showed severe narrowing of the glenohumeral joint on the left, secondary to arthropathy.  There are no other known active modifying factors.    Past Medical History:  Diagnosis Date  . Hypertension     Patient Active Problem List   Diagnosis Date Noted  . Atrial fibrillation (HCC) 10/17/2020    History reviewed. No pertinent surgical history.   OB History   No obstetric history on file.     History reviewed. No pertinent family history.  Social History   Tobacco Use  . Smoking status: Former Games developermoker  . Smokeless tobacco: Never Used  Substance Use Topics  . Alcohol use: No  . Drug use: No    Home Medications Prior to Admission medications   Medication Sig Start Date End Date Taking? Authorizing Provider  acetaminophen (TYLENOL) 500 MG tablet Take 500 mg by mouth every 6 (six) hours as needed for mild pain, fever or headache.   Yes [provider]  aspirin EC 81 MG tablet Take 81 mg by mouth daily. Swallow whole.    Yes [provider]  aspirin-sod bicarb-citric acid (ALKA-SELTZER) 325 MG TBEF tablet Take 325 mg by mouth every 6 (six) hours as needed (congestion).   Yes [provider]  atorvastatin (LIPITOR) 20 MG tablet Take 20 mg by mouth daily.   Yes [provider]  diltiazem (CARDIZEM) 30 MG tablet Take 30 mg by mouth 2 (two) times daily. 01/02/20  Yes [provider]  famotidine (PEPCID) 20 MG tablet Take 20 mg by mouth daily. 08/20/20  Yes [provider]  losartan (COZAAR) 50 MG tablet Take 50 mg by mouth daily. 10/26/16  Yes [provider]  metoCLOPramide (REGLAN) 5 MG tablet Take 5 mg by mouth 2 (two) times daily. 10/03/20  Yes [provider]  metoprolol tartrate (LOPRESSOR) 50 MG tablet Take 50 mg by mouth 2 (two) times daily. 05/06/20  Yes [provider]  VITAMIN D PO Take 1 capsule by mouth daily.   Yes [provider]    Allergies    Patient has no known allergies.  Review of Systems   Review of Systems  All other systems reviewed and are negative.   Physical Exam Updated Vital Signs BP (!) 171/89   Pulse 72   Temp 98.2 F (36.8 C) (Oral)   Resp (!) 25   Ht 5' (1.524 m)   Wt 81 kg  SpO2 99%   BMI 34.88 kg/m   Physical Exam Vitals and nursing note reviewed.  Constitutional:      General: She is not in acute distress.    Appearance: She is well-developed. She is not ill-appearing or toxic-appearing.  HENT:     Head: Normocephalic and atraumatic.     Right Ear: External ear normal.     Left Ear: External ear normal.  Eyes:     Conjunctiva/sclera: Conjunctivae normal.     Pupils: Pupils are equal, round, and reactive to light.  Neck:     Trachea: Phonation normal.  Cardiovascular:     Rate and Rhythm: Normal rate and regular rhythm.     Heart sounds: Normal heart sounds.  Pulmonary:     Effort: Pulmonary effort is normal.     Breath sounds: Normal breath sounds.  Abdominal:     General:  There is no distension.     Palpations: Abdomen is soft.     Tenderness: There is no abdominal tenderness.  Musculoskeletal:        General: Normal range of motion.     Cervical back: Normal range of motion and neck supple.     Comments: I am able to mobilize the left shoulder without significant pain.  She does have diminished movement of the left shoulder secondary to mild discomfort.  Skin:    General: Skin is warm and dry.  Neurological:     Mental Status: She is alert and oriented to person, place, and time.     Cranial Nerves: No cranial nerve deficit.     Sensory: No sensory deficit.     Motor: No abnormal muscle tone.     Coordination: Coordination normal.  Psychiatric:        Mood and Affect: Mood normal.        Behavior: Behavior normal.        Thought Content: Thought content normal.        Judgment: Judgment normal.     ED Results / Procedures / Treatments   Labs (all labs ordered are listed, but only abnormal results are displayed) Labs Reviewed  BASIC METABOLIC PANEL - Abnormal; Notable for the following components:      Result Value   Glucose, Bld 101 (*)    Creatinine, Ser 1.36 (*)    GFR, Estimated 35 (*)    All other components within normal limits  CBC WITH DIFFERENTIAL/PLATELET    EKG EKG Interpretation  Date/Time:  Thursday October 17 2020 08:30:35 EDT Ventricular Rate:  88 PR Interval:    QRS Duration: 136 QT Interval:  399 QTC Calculation: 483 R Axis:   -63 Text Interpretation: Atrial fibrillation Right bundle branch block Since last tracing Atrial fibrillation is new Otherwise no significant change Confirmed by Mancel Bale [phone]) on 10/17/2020 11:13:55 AM   Radiology DG Chest 2 View  Result Date: 10/17/2020 CLINICAL DATA:  Left arm pain Cough EXAM: CHEST - 2 VIEW COMPARISON:  12/14/2019 FINDINGS: Unchanged mild cardiomegaly. No significant pulmonary vascular congestion. Linear opacities in the right mid lung are unchanged from prior exam  and likely due to scarring. There is abnormal focal opacity in the right infrahilar lung. Lungs otherwise clear. Advanced degenerative changes of the glenohumeral joints. IMPRESSION: 1. Focal abnormal opacity in the right infrahilar lung is new since prior examinations. Although this may be related to pneumonia, a mass at this site is difficult to completely exclude. Follow-up chest radiograph should be obtained in 2-4 weeks and  if this finding persists, further evaluation with contrast enhanced CT should be performed. 2. No significant abnormality of the left lung. Electronically Signed   By: Acquanetta Belling M.D.   On: 10/17/2020 10:12    Procedures Procedures   Medications Ordered in ED Medications - No data to display  ED Course  I have reviewed the triage vital signs and the nursing notes.  Pertinent labs & imaging results that were available during my care of the patient were reviewed by me and considered in my medical decision making (see chart for details).  Clinical Course as of 10/17/20 1548  Thu Oct 17, 2020  1253 I was able to reach her cardiologist, Dr.Kadakia.  He states that the patient has known atrial fibrillation and is on Cardizem and metoprolol for that.  He plans on following up with the patient in the office, either tomorrow or in 4 days, as needed by family. [EW]    Clinical Course User Index [EW] Mancel Bale, MD   MDM Rules/Calculators/A&P                           Patient Vitals for the past 24 hrs:  BP Temp Temp src Pulse Resp SpO2 Height Weight  10/17/20 1300 (!) 171/89 -- -- 72 (!) 25 99 % -- --  10/17/20 1245 (!) 169/98 -- -- 82 14 98 % -- --  10/17/20 1230 (!) 171/102 -- -- 91 (!) 21 99 % -- --  10/17/20 1215 (!) 167/90 -- -- 78 13 98 % -- --  10/17/20 1200 (!) 173/111 -- -- 75 17 99 % -- --  10/17/20 1145 (!) 173/74 -- -- 75 19 97 % -- --  10/17/20 1130 (!) 168/90 -- -- 79 20 98 % -- --  10/17/20 1115 (!) 160/94 -- -- 80 (!) 21 96 % -- --  10/17/20  1100 (!) 149/78 -- -- 74 15 99 % -- --  10/17/20 1059 (!) 149/78 -- -- 84 17 94 % -- --  10/17/20 1045 -- -- -- 73 17 99 % -- --  10/17/20 1030 -- -- -- 72 18 98 % -- --  10/17/20 1015 -- -- -- 77 17 98 % -- --  10/17/20 1000 -- -- -- 79 16 100 % -- --  10/17/20 0945 -- -- -- 80 12 92 % -- --  10/17/20 0930 -- -- -- 76 11 100 % -- --  10/17/20 0915 (!) 156/85 -- -- 79 16 98 % -- --  10/17/20 0900 (!) 166/96 -- -- 85 (!) 21 100 % -- --  10/17/20 0845 (!) 171/95 -- -- 85 16 99 % -- --  10/17/20 0830 (!) 166/90 -- -- 80 (!) 21 100 % -- --  10/17/20 0826 (!) 156/89 98.2 F (36.8 C) Oral 83 -- 99 % -- --  10/17/20 0825 -- -- -- -- -- -- 5' (1.524 m) 81 kg    At discharge-  reevaluation with update and discussion. After initial assessment and treatment, an updated evaluation reveals no change in status, findings discussed with patient's daughter and patient, all questions answered. Mancel Bale   Medical Decision Making:  This patient is presenting for evaluation of ongoing cough, and left arm discomfort, which does require a range of treatment options, and is a complaint that involves a moderate risk of morbidity and mortality. The differential diagnoses include bronchitis, pneumonia, nerve impingement, degenerative joint disease. I decided to review old  records, and in summary elderly patient presenting with nonspecific symptoms requiring work-up.  I obtained additional historical information from daughter at bedside.  Clinical Laboratory Tests Ordered, included CBC and Metabolic panel. Review indicates normal except creatinine high, GFR low. Radiologic Tests Ordered, included chest x-ray.  I independently Visualized: Radiographic images, which show no acute abnormality    Critical Interventions-clinical evaluation, laboratory testing, chest x-ray, observation reassessment  Case discussed with her cardiologist who states she has a history of atrial fibrillation and is currently on rate  control medications.  After These Interventions, the Patient was reevaluated and was found with chronic pain left shoulder/arm, and nonspecific chest pain.  Patient in atrial fibrillation, rate controlled.  Doubt ACS, pneumonia, acute infectious process.  CRITICAL CARE-no Performed by: Mancel Bale  Nursing Notes Reviewed/ Care Coordinated Applicable Imaging Reviewed Interpretation of Laboratory Data incorporated into ED treatment  The patient appears reasonably screened and/or stabilized for discharge and I doubt any other medical condition or other [facility] requiring further screening, evaluation, or treatment in the ED at this time prior to discharge.  Plan: Home Medications-continue current; Home Treatments-rest, fluids; return here if the recommended treatment, does not improve the symptoms; Recommended follow up-PCP, as needed.  Call cardiology for follow-up appointment in 1 to 3 days.     Final Clinical Impression(s) / ED Diagnoses Final diagnoses:  Chronic left shoulder pain  Longstanding persistent atrial fibrillation [facility])    Rx / DC Orders ED Discharge Orders    None       Mancel Bale, MD 10/17/20 1550
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
## 2020-10-18 ENCOUNTER — Ambulatory Visit: Payer: Medicare Other | Admitting: Podiatry

## 2020-11-07 ENCOUNTER — Emergency Department (HOSPITAL_COMMUNITY)
Admission: EM | Admit: 2020-11-07 | Discharge: 2020-11-07 | Disposition: A | Discharge status: Home / Self Care | Payer: Medicare Other | Attending: Emergency Medicine | Admitting: Emergency Medicine

## 2020-11-07 ENCOUNTER — Other Ambulatory Visit: Payer: Self-pay

## 2020-11-07 ENCOUNTER — Encounter (HOSPITAL_COMMUNITY): Payer: Self-pay | Admitting: Emergency Medicine

## 2020-11-07 ENCOUNTER — Emergency Department (HOSPITAL_COMMUNITY): Payer: Medicare Other

## 2020-11-07 DIAGNOSIS — Z79899 Other long term (current) drug therapy: Secondary | ICD-10-CM | POA: Insufficient documentation

## 2020-11-07 DIAGNOSIS — N858 Other specified noninflammatory disorders of uterus: Secondary | ICD-10-CM | POA: Diagnosis not present

## 2020-11-07 DIAGNOSIS — Z7982 Long term (current) use of aspirin: Secondary | ICD-10-CM | POA: Diagnosis not present

## 2020-11-07 DIAGNOSIS — Z87891 Personal history of nicotine dependence: Secondary | ICD-10-CM | POA: Insufficient documentation

## 2020-11-07 DIAGNOSIS — Z20822 Contact with and (suspected) exposure to covid-19: Secondary | ICD-10-CM | POA: Diagnosis not present

## 2020-11-07 DIAGNOSIS — I10 Essential (primary) hypertension: Secondary | ICD-10-CM | POA: Insufficient documentation

## 2020-11-07 DIAGNOSIS — R1031 Right lower quadrant pain: Secondary | ICD-10-CM | POA: Diagnosis not present

## 2020-11-07 LAB — URINALYSIS, ROUTINE W REFLEX MICROSCOPIC
Bilirubin Urine: NEGATIVE
Glucose, UA: NEGATIVE mg/dL
Hgb urine dipstick: NEGATIVE
Ketones, ur: NEGATIVE mg/dL
Leukocytes,Ua: NEGATIVE
Nitrite: NEGATIVE
Protein, ur: NEGATIVE mg/dL
Specific Gravity, Urine: 1.015 (ref 1.005–1.030)
pH: 8 (ref 5.0–8.0)

## 2020-11-07 LAB — COMPREHENSIVE METABOLIC PANEL WITH GFR
ALT: 9 U/L (ref 0–44)
AST: 34 U/L (ref 15–41)
Albumin: 3.2 g/dL — ABNORMAL LOW (ref 3.5–5.0)
Alkaline Phosphatase: 100 U/L (ref 38–126)
Anion gap: 8 (ref 5–15)
BUN: 16 mg/dL (ref 8–23)
CO2: 24 mmol/L (ref 22–32)
Calcium: 9.4 mg/dL (ref 8.9–10.3)
Chloride: 104 mmol/L (ref 98–111)
Creatinine, Ser: 1.31 mg/dL — ABNORMAL HIGH (ref 0.44–1.00)
GFR, Estimated: 37 mL/min — ABNORMAL LOW
Glucose, Bld: 88 mg/dL (ref 70–99)
Potassium: 5.1 mmol/L (ref 3.5–5.1)
Sodium: 136 mmol/L (ref 135–145)
Total Bilirubin: 1.4 mg/dL — ABNORMAL HIGH (ref 0.3–1.2)
Total Protein: 6.9 g/dL (ref 6.5–8.1)

## 2020-11-07 LAB — CBC
HCT: 44 % (ref 36.0–46.0)
Hemoglobin: 14.1 g/dL (ref 12.0–15.0)
MCH: 27.6 pg (ref 26.0–34.0)
MCHC: 32 g/dL (ref 30.0–36.0)
MCV: 86.3 fL (ref 80.0–100.0)
Platelets: 325 10*3/uL (ref 150–400)
RBC: 5.1 MIL/uL (ref 3.87–5.11)
RDW: 13.6 % (ref 11.5–15.5)
WBC: 3.9 10*3/uL — ABNORMAL LOW (ref 4.0–10.5)
nRBC: 0 % (ref 0.0–0.2)

## 2020-11-07 LAB — RESP PANEL BY RT-PCR (FLU A&B, COVID) ARPGX2
Influenza A by PCR: NEGATIVE
Influenza B by PCR: NEGATIVE
SARS Coronavirus 2 by RT PCR: NEGATIVE

## 2020-11-07 LAB — POC OCCULT BLOOD, ED: Fecal Occult Bld: NEGATIVE

## 2020-11-07 MED ORDER — ONDANSETRON HCL 4 MG/2ML IJ SOLN
4.0000 mg | Freq: Once | INTRAMUSCULAR | Status: AC
  Administered 2020-11-07: 4 mg via INTRAVENOUS
  Filled 2020-11-07: qty 2

## 2020-11-07 MED ORDER — IOHEXOL 300 MG/ML  SOLN
70.0000 mL | Freq: Once | INTRAMUSCULAR | Status: AC | PRN
  Administered 2020-11-07: 70 mL via INTRAVENOUS

## 2020-11-07 MED ORDER — FENTANYL CITRATE (PF) 100 MCG/2ML IJ SOLN
12.5000 ug | Freq: Once | INTRAMUSCULAR | Status: AC
  Administered 2020-11-07: 12.5 ug via INTRAVENOUS
  Filled 2020-11-07: qty 2

## 2020-11-07 MED ORDER — SODIUM CHLORIDE 0.9 % IV BOLUS
500.0000 mL | Freq: Once | INTRAVENOUS | Status: AC
  Administered 2020-11-07: 500 mL via INTRAVENOUS

## 2020-11-07 MED ORDER — DOXYCYCLINE HYCLATE 100 MG PO CAPS: 100.0000 mg | ORAL_CAPSULE | Freq: Two times a day (BID) | ORAL | 0 refills | Status: DC

## 2020-11-07 NOTE — ED Triage Notes (Addendum)
PT BIB EMS due to abd pain. Pt has a hx of abd pain and states it gets worse after eating. Pt is axox4. Pt is stable. Pt states abd pain is 4/10. Pts last BM was yesterday 4/20. Pt denies blood in stool. Pt received 81mg  of aspirin via EMS> Pt denies EMS.

## 2020-11-07 NOTE — Discharge Instructions (Addendum)
Please take antibiotics as prescribed Please call Dr. Waynard Edwards office for recheck in the next week Return to the emergency department if you are having return of pain or worsening symptoms

## 2020-11-07 NOTE — ED Provider Notes (Signed)
[facility] EMERGENCY DEPARTMENT Provider Note   CSN: [id number] Arrival date & time: 11/07/20  1126     History Chief Complaint  Patient presents with  . Abdominal Pain    Sherry [name] is a 87 y.o. female.  HPI 87 year old female history of hypertension atrial fibrillation presents today complaining of abdominal pain.  States that her pain began 2 days ago.  She has crampy 4 out of 10 pain that radiates to the right lower quadrant.  She has had some frequency of urination but denies any hematuria or pain with urination.  She has had nausea but no vomiting.  She has decreased appetite.  She attempted to eat last night but it made her more nauseated.  She has not vomited.  She complains of constipation but states that she had a bowel movement yesterday.  There is no blood in the stool.  She has not had diarrhea.  She has not had similar pain in the past.  She does not think that she has had any surgeries on her abdomen.    Past Medical History:  Diagnosis Date  . Hypertension     Patient Active Problem List   Diagnosis Date Noted  . Atrial fibrillation (HCC) 10/17/2020    History reviewed. No pertinent surgical history.   OB History   No obstetric history on file.     History reviewed. No pertinent family history.  Social History   Tobacco Use  . Smoking status: Former Games developer  . Smokeless tobacco: Never Used  Substance Use Topics  . Alcohol use: No  . Drug use: No    Home Medications Prior to Admission medications   Medication Sig Start Date End Date Taking? Authorizing Provider  acetaminophen (TYLENOL) 500 MG tablet Take 500 mg by mouth every 6 (six) hours as needed for mild pain, fever or headache.    [provider]  aspirin EC 81 MG tablet Take 81 mg by mouth daily. Swallow whole.    [provider]  aspirin-sod bicarb-citric acid (ALKA-SELTZER) 325 MG TBEF tablet Take 325 mg by mouth every 6 (six) hours as needed  (congestion).    [provider]  atorvastatin (LIPITOR) 20 MG tablet Take 20 mg by mouth daily.    [provider]  diltiazem (CARDIZEM) 30 MG tablet Take 30 mg by mouth 2 (two) times daily. 01/02/20   [provider]  famotidine (PEPCID) 20 MG tablet Take 20 mg by mouth daily. 08/20/20   [provider]  losartan (COZAAR) 50 MG tablet Take 50 mg by mouth daily. 10/26/16   [provider]  metoCLOPramide (REGLAN) 5 MG tablet Take 5 mg by mouth 2 (two) times daily. 10/03/20   [provider]  metoprolol tartrate (LOPRESSOR) 50 MG tablet Take 50 mg by mouth 2 (two) times daily. 05/06/20   [provider]  VITAMIN D PO Take 1 capsule by mouth daily.    [provider]    Allergies    Patient has no known allergies.  Review of Systems   Review of Systems  All other systems reviewed and are negative.   Physical Exam Updated Vital Signs There were no vitals taken for this visit.  Physical Exam Vitals and nursing note reviewed.  HENT:     Head: Normocephalic.     Mouth/Throat:     Mouth: Mucous membranes are moist.  Eyes:     Extraocular Movements: Extraocular movements intact.  Cardiovascular:  Rate and Rhythm: Normal rate and regular rhythm.     Heart sounds: Normal heart sounds.  Pulmonary:     Effort: Pulmonary effort is normal.  Abdominal:     General: Abdomen is flat. Bowel sounds are normal.     Palpations: Abdomen is soft.     Tenderness: There is abdominal tenderness in the right lower quadrant.  Skin:    General: Skin is warm and dry.  Neurological:     Mental Status: She is alert.     ED Results / Procedures / Treatments   Labs (all labs ordered are listed, but only abnormal results are displayed) Labs Reviewed - No data to display  EKG None  Radiology CT ABDOMEN PELVIS W CONTRAST  Result Date: 11/07/2020 CLINICAL DATA:  Acute generalized abdominal pain. EXAM: CT ABDOMEN AND PELVIS WITH  CONTRAST TECHNIQUE: Multidetector CT imaging of the abdomen and pelvis was performed using the standard protocol following bolus administration of intravenous contrast. CONTRAST:  20mL OMNIPAQUE IOHEXOL 300 MG/ML  SOLN COMPARISON:  None. FINDINGS: Lower chest: Mild patchy subpleural reticulation and ground-glass opacity at both lung bases. Mild cardiomegaly. Coronary atherosclerosis. Hepatobiliary: Normal liver size. Subcentimeter hypodense right liver dome lesion is too small to characterize. No additional liver lesions. Small amount of layering hyperdense material in the nondistended gallbladder compatible with sludge or tiny gallstones. No gallbladder wall thickening. No pericholecystic fluid. No biliary ductal dilatation. Pancreas: Normal, with no mass or duct dilation. Spleen: Normal size. No mass. Adrenals/Urinary Tract: Normal adrenals. No hydronephrosis. Dominant 3.9 cm simple upper left renal cyst with parapelvic component. Several subcentimeter hypodense renal cortical lesions scattered throughout both kidneys, too small to characterize, requiring no follow-up. Normal bladder. Stomach/Bowel: Normal non-distended stomach. Normal caliber small bowel with no small bowel wall thickening. Normal appendix. Marked diffuse colonic diverticulosis with no large bowel wall thickening or significant pericolonic fat stranding. Vascular/Lymphatic: Atherosclerotic abdominal aorta with mildly dilated 2.6 cm infrarenal abdominal aorta. Patent portal, splenic, hepatic and renal veins. No pathologically enlarged lymph nodes in the abdomen or pelvis. Reproductive: There is gas and fluid mildly distending the uterine cavity up to 12 mm thickness. Uterus is otherwise atrophic. No right adnexal mass. Simple 1.6 cm left adnexal cyst (series 3/image 51). No follow-up imaging recommended. Note: This recommendation does not apply to premenarchal patients and to those with increased risk (genetic, family history, elevated tumor  markers or other high-risk factors) of ovarian cancer. Reference: JACR 2020 Feb; 17(2):248-254 Other: No pneumoperitoneum, ascites or focal fluid collection. Musculoskeletal: No aggressive appearing focal osseous lesions. Marked thoracolumbar spondylosis. IMPRESSION: 1. Gas and fluid mildly distending the uterine cavity up to 12 mm thickness, nonspecific, potentially due to incompetence cervix, with endometritis or other uterine abnormality not excluded. Consider correlation with pelvic ultrasound as clinically warranted. 2. Marked diffuse colonic diverticulosis, with no evidence of acute diverticulitis. No evidence of bowel obstruction or acute bowel inflammation. Normal appendix. 3. Mild patchy subpleural reticulation and ground-glass opacity at both lung bases, nonspecific, cannot exclude interstitial lung disease. 4. Aortic Atherosclerosis (ICD10-I70.0). Electronically Signed   By: Delbert Phenix M.D.   On: 11/07/2020 14:17   DG Chest Port 1 View  Result Date: 11/07/2020 CLINICAL DATA:  abdominal pain and difficulty swallowing EXAM: PORTABLE CHEST 1 VIEW COMPARISON:  10/17/2020 FINDINGS: Unchanged cardiomediastinal silhouette with aortic arch atherosclerosis and torturous thoracic aorta. There is no new focal airspace disease. No pleural effusion or pneumothorax. There is bilateral shoulder degenerative change. No acute osseous abnormality. IMPRESSION: No focal  airspace disease. Unchanged, enlarged cardiomediastinal silhouette. Electronically Signed   By: Caprice Renshaw   On: 11/07/2020 12:57    Procedures Procedures   Medications Ordered in ED Medications - No data to display  ED Course  I have reviewed the triage vital signs and the nursing notes.  Pertinent labs & imaging results that were available during my care of the patient were reviewed by me and considered in my medical decision making (see chart for details).    MDM Rules/Calculators/A&P                          Today with nausea  vomiting and right lower quadrant tenderness on exam.  CT reveals normal appendix with uterine abnormality noted.  Also some bibasilar vascular appearance of lungs.  However patient without any chest pain, cough, dyspnea.  Discussed uterine findings with Dr. Daryel November, on-call for gynecology.  Plan doxycycline for 1 week.  She will be given referral to follow-up with gyn and op [name] will be obtained if indicated Patient has eaten salmon strength glasses of water.  No vomiting here.  She states that she feels improved and is not complaining of abdominal pain now.  Final Clinical Impression(s) / ED Diagnoses Final diagnoses:  Right lower quadrant abdominal pain  Abnormal collection of fluid in uterine cavity    Rx / DC Orders ED Discharge Orders    None       Margarita Grizzle, MD 11/07/20 [phone]

## 2020-11-08 ENCOUNTER — Ambulatory Visit: Payer: Medicare Other | Admitting: Podiatry

## 2020-11-11 ENCOUNTER — Encounter: Payer: Self-pay | Admitting: Podiatry

## 2020-11-11 ENCOUNTER — Other Ambulatory Visit: Payer: Self-pay

## 2020-11-11 ENCOUNTER — Ambulatory Visit (INDEPENDENT_AMBULATORY_CARE_PROVIDER_SITE_OTHER): Payer: Medicare Other | Admitting: Podiatry

## 2020-11-11 DIAGNOSIS — B351 Tinea unguium: Secondary | ICD-10-CM | POA: Diagnosis not present

## 2020-11-11 DIAGNOSIS — M79676 Pain in unspecified toe(s): Secondary | ICD-10-CM

## 2020-11-11 NOTE — Progress Notes (Signed)
This patient presents to the office with chief complaint of long thick painful nails.  Patient says the nails are painful walking and wearing shoes.  This patient is unable to self treat.  This patient is unable to trim her nails since she is unable to reach her nails. She is also concerned about her heel callus. She presents to the office for preventative foot care services.   She presents to the office with her daughter..  General Appearance  Alert, conversant and in no acute stress.  Vascular  Dorsalis pedis and posterior tibial  pulses are weakly  palpable  bilaterally.  Capillary return is within normal limits  bilaterally. Temperature is within normal limits  bilaterally.  Neurologic  Senn-Weinstein monofilament wire test within normal limits  bilaterally. Muscle power within normal limits bilaterally.  Nails Thick disfigured discolored nails with subungual debris  from hallux to fifth toes bilaterally. No evidence of bacterial infection or drainage bilaterally.  Orthopedic  No limitations of motion  feet .  No crepitus or effusions noted.  No bony pathology or digital deformities noted.  HAV  B/L.  Hammer toes  B/L.  Skin  normotropic skin with no porokeratosis noted bilaterally.  No signs of infections or ulcers noted.   Callus on medial side left heel and lateral side right heel. Porokeratosisdorsolateral aspect left foot.  Onychomycosis  Nails  B/L.  Pain in right toes  Pain in left toes  Callus heels  B/L  Debridement of nails both feet followed trimming the nails with dremel tool.  Debride callus with dremel tool.   RTC  10 weeks    Helane Gunther DPM

## 2020-11-19 ENCOUNTER — Other Ambulatory Visit: Payer: Self-pay

## 2020-11-19 ENCOUNTER — Encounter (HOSPITAL_COMMUNITY): Payer: Self-pay

## 2020-11-19 ENCOUNTER — Emergency Department (HOSPITAL_COMMUNITY)
Admission: EM | Admit: 2020-11-19 | Discharge: 2020-11-19 | Disposition: A | Discharge status: Home / Self Care | Payer: Medicare Other | Attending: Emergency Medicine | Admitting: Emergency Medicine

## 2020-11-19 ENCOUNTER — Emergency Department (HOSPITAL_COMMUNITY): Payer: Medicare Other

## 2020-11-19 DIAGNOSIS — I10 Essential (primary) hypertension: Secondary | ICD-10-CM | POA: Insufficient documentation

## 2020-11-19 DIAGNOSIS — W19XXXA Unspecified fall, initial encounter: Secondary | ICD-10-CM | POA: Insufficient documentation

## 2020-11-19 DIAGNOSIS — Z7982 Long term (current) use of aspirin: Secondary | ICD-10-CM | POA: Diagnosis not present

## 2020-11-19 DIAGNOSIS — M25461 Effusion, right knee: Secondary | ICD-10-CM | POA: Diagnosis not present

## 2020-11-19 DIAGNOSIS — Z79899 Other long term (current) drug therapy: Secondary | ICD-10-CM | POA: Insufficient documentation

## 2020-11-19 DIAGNOSIS — S8001XA Contusion of right knee, initial encounter: Secondary | ICD-10-CM | POA: Diagnosis not present

## 2020-11-19 DIAGNOSIS — Z87891 Personal history of nicotine dependence: Secondary | ICD-10-CM | POA: Insufficient documentation

## 2020-11-19 DIAGNOSIS — S80911A Unspecified superficial injury of right knee, initial encounter: Secondary | ICD-10-CM | POA: Diagnosis present

## 2020-11-19 LAB — CBC WITH DIFFERENTIAL/PLATELET
Abs Immature Granulocytes: 0.02 10*3/uL (ref 0.00–0.07)
Basophils Absolute: 0 10*3/uL (ref 0.0–0.1)
Basophils Relative: 0 %
Eosinophils Absolute: 0.1 10*3/uL (ref 0.0–0.5)
Eosinophils Relative: 1 %
HCT: 39.3 % (ref 36.0–46.0)
Hemoglobin: 13 g/dL (ref 12.0–15.0)
Immature Granulocytes: 0 %
Lymphocytes Relative: 25 %
Lymphs Abs: 1.6 10*3/uL (ref 0.7–4.0)
MCH: 28 pg (ref 26.0–34.0)
MCHC: 33.1 g/dL (ref 30.0–36.0)
MCV: 84.5 fL (ref 80.0–100.0)
Monocytes Absolute: 1.1 10*3/uL — ABNORMAL HIGH (ref 0.1–1.0)
Monocytes Relative: 17 %
Neutro Abs: 3.5 10*3/uL (ref 1.7–7.7)
Neutrophils Relative %: 57 %
Platelets: 207 10*3/uL (ref 150–400)
RBC: 4.65 MIL/uL (ref 3.87–5.11)
RDW: 13.6 % (ref 11.5–15.5)
WBC: 6.2 10*3/uL (ref 4.0–10.5)
nRBC: 0 % (ref 0.0–0.2)

## 2020-11-19 LAB — BASIC METABOLIC PANEL
Anion gap: 9 (ref 5–15)
BUN: 20 mg/dL (ref 8–23)
CO2: 28 mmol/L (ref 22–32)
Calcium: 9.1 mg/dL (ref 8.9–10.3)
Chloride: 96 mmol/L — ABNORMAL LOW (ref 98–111)
Creatinine, Ser: 1.47 mg/dL — ABNORMAL HIGH (ref 0.44–1.00)
GFR, Estimated: 32 mL/min — ABNORMAL LOW (ref >60)
Glucose, Bld: 82 mg/dL (ref 70–99)
Potassium: 4 mmol/L (ref 3.5–5.1)
Sodium: 133 mmol/L — ABNORMAL LOW (ref 135–145)

## 2020-11-19 MED ORDER — HYDROCORTISONE (PERIANAL) 2.5 % EX CREA: 1.0000 "application " | TOPICAL_CREAM | Freq: Two times a day (BID) | CUTANEOUS | 0 refills | Status: DC

## 2020-11-19 NOTE — Discharge Planning (Signed)
CSW met with Pt and family at bedside. Pt states that she has never received [facility] services previously and has no preference for any particular [place]. CSW explained that [facility] will call family within 24-48 hours to set up time to go out to home.  Pt states she already has a 3-in-1.  RNCM updated.

## 2020-11-19 NOTE — ED Triage Notes (Signed)
Patient BIB [facility] for a fall.  Patient fell on Friday and has right knee pain and swelling along with right ankle pain.

## 2020-11-19 NOTE — Progress Notes (Addendum)
.   Transition of Care [facility]) - Emergency Department Mini Assessment   Patient Details  Name: Sherry [name] MRN: [id number] Date of Birth: [birth year]  Transition of Care [facility]) CM/SW Contact:    Elliot Cousin, RN Phone Number: [phone] 11/19/2020, 6:44 PM   Clinical Narrative:  TOC CM spoke to pt's dtr, [name]. States her daughter are in the home to assist patient. They are need an aide to help with bathing. Explained HH agency will assist with her getting [place] Medicaid Personal Care Services started. Offered choice for [facility] RN and PT, dtr states she wants provider that accept insurance. Contacted rep, Kandee Keen with [facility] with new referral. Explained she needed in home aide through [facility]. Wheelchair and RW order sent to [employer], Ian Malkin for delivery to home. Pt has Bedside commode at home.   ED Mini Assessment: What brought you to the Emergency Department? : fall, pain  Barriers to Discharge: No Barriers Identified  Barrier interventions: arranged home health and DME  Means of departure: Car  Interventions which prevented an admission or readmission: Home Health Consult or Services,DME Provided    Patient Contact and Communications Key Contact 1: Carney Living   Spoke with: daughter Contact Date: 11/19/20,   Contact time: 1844        CMS Medicare.gov Compare Post Acute Care list provided to:: Patient Represenative (must comment) (daughter Carney Living) Choice offered to / list presented to : Adult Children  Admission diagnosis:  87yo , fall from friday Patient Active Problem List   Diagnosis Date Noted  . Atrial fibrillation (HCC) 10/17/2020   PCP:  Orpah Cobb, MD Pharmacy:   [facility] [address] - [place],  - [address] [facility] [address] [place] [address] Phone: [phone] Fax: [phone]  [facility] [phone] - [place], [place] - [phone] [facility] [address] AT [facility] [address] & [address] [place]  [place] [address] Phone: [phone] Fax: [phone]

## 2020-11-19 NOTE — Discharge Instructions (Signed)
Your x-ray showed arthritis but no broken bones today.  Your lab work looked okay without any acute problems.  Please follow-up with your doctor and the orthopedic doctor listed for further evaluation of your knee.

## 2020-11-19 NOTE — ED Notes (Signed)
Per family at bedside, can transport pt home in private vehicle. Family just state they need help getting her into the car, and has people at the house to help pt in the home. MD made aware.

## 2020-11-19 NOTE — ED Notes (Signed)
Patient verbalizes understanding of discharge instructions. Opportunity for questioning and answers were provided. Armband removed by staff, pt discharged from ED via wheelchair with family.  

## 2020-11-19 NOTE — ED Provider Notes (Signed)
I personally evaluated the patient during the encounter and completed a history, physical, procedures, medical decision making to contribute to the overall care of the patient and decision making for the patient briefly, the patient is a 87 y.o. female right knee pain after fall 5 days ago.  Patient with pain and swelling to the right knee.  Has been able to ambulate however with her walker.  Not on blood thinners.  Has swelling to the right knee.  Will get x-ray.  Suspect traumatic hemarthrosis.  Having a harder time with her walker.  Feels like she would be more comfortable with a wheelchair.  She is neurovascularly intact.  Neurologically intact.  Overall she is very pleasant.  Please see PA note for further results, evaluation, disposition of the patient.  If knee is fractured will likely need to be admitted.  If not fracture we will try to arrange for wheelchair.  This chart was dictated using voice recognition software.  Despite best efforts to proofread,  errors can occur which can change the documentation meaning.     EKG Interpretation None           Virgina Norfolk, DO 11/19/20 1556

## 2020-11-19 NOTE — ED Provider Notes (Signed)
[facility] EMERGENCY DEPARTMENT Provider Note   CSN: [id number] Arrival date & time: 11/19/20  1420     History Chief Complaint  Patient presents with  . Fall    Sherry [name] is a 87 y.o. female.  Patient presents to the emergency department for evaluation of right knee pain.  Patient has history of hypertension and high cholesterol.  She is not on any anticoagulation.  She states that she fell while returning to her bed from the bathroom about 5 mornings ago.  Patient has had swelling and pain in her right knee that is not improving.  She has been applying ice packs at home with some improvement.  She denies hip pain or ankle pain.  She thinks that she hit her head, however in the interim has not had a headache, vomiting, confusion.  She denies weakness, numbness, or tingling in arms or legs.  No CP, abdominal pain, urinary symptoms. She denies any recent illness.  She lives at home with her 2 daughters. The onset of this condition was acute. The course is constant. Aggravating factors: movement. Alleviating factors: none.          Past Medical History:  Diagnosis Date  . Hypertension     Patient Active Problem List   Diagnosis Date Noted  . Atrial fibrillation (HCC) 10/17/2020    History reviewed. No pertinent surgical history.   OB History   No obstetric history on file.     No family history on file.  Social History   Tobacco Use  . Smoking status: Former Games developer  . Smokeless tobacco: Never Used  Substance Use Topics  . Alcohol use: No  . Drug use: No    Home Medications Prior to Admission medications   Medication Sig Start Date End Date Taking? Authorizing Provider  acetaminophen (TYLENOL) 500 MG tablet Take 500 mg by mouth every 6 (six) hours as needed for mild pain, fever or headache.    [provider]  aspirin EC 81 MG tablet Take 81 mg by mouth daily. Swallow whole.    [provider]  aspirin-sod bicarb-citric  acid (ALKA-SELTZER) 325 MG TBEF tablet Take 325 mg by mouth every 6 (six) hours as needed (congestion).    [provider]  atorvastatin (LIPITOR) 20 MG tablet Take 20 mg by mouth daily.    [provider]  diltiazem (CARDIZEM) 30 MG tablet Take 30 mg by mouth 2 (two) times daily. 01/02/20   [provider]  doxycycline (VIBRAMYCIN) 100 MG capsule Take 1 capsule (100 mg total) by mouth 2 (two) times daily. 11/07/20   Margarita Grizzle, MD  famotidine (PEPCID) 20 MG tablet Take 20 mg by mouth daily. 08/20/20   [provider]  losartan (COZAAR) 50 MG tablet Take 50 mg by mouth daily. 10/26/16   [provider]  metoCLOPramide (REGLAN) 5 MG tablet Take 5 mg by mouth 2 (two) times daily. 10/03/20   [provider]  metoprolol tartrate (LOPRESSOR) 50 MG tablet Take 50 mg by mouth 2 (two) times daily. 05/06/20   [provider]  VITAMIN D PO Take 1 capsule by mouth daily.    [provider]    Allergies    Patient has no known allergies.  Review of Systems   Review of Systems  Constitutional: Negative for activity change.  Cardiovascular: Negative for chest pain.  Gastrointestinal: Negative for abdominal pain, nausea and vomiting.  Genitourinary: Negative for dysuria.  Musculoskeletal: Positive for arthralgias  and joint swelling. Negative for back pain, myalgias and neck pain.  Skin: Negative for wound.  Neurological: Negative for weakness, numbness and headaches.    Physical Exam Updated Vital Signs BP 124/66 (BP Location: Right Arm)   Pulse 70   Temp 98.3 F (36.8 C) (Oral)   Resp 18   Ht 5\' 4"  (1.626 m)   Wt 80.7 kg   SpO2 99%   BMI 30.55 kg/m   Physical Exam Vitals and nursing note reviewed.  Constitutional:      Appearance: She is well-developed.  HENT:     Head: Normocephalic and atraumatic.  Eyes:     Pupils: Pupils are equal, round, and reactive to light.  Cardiovascular:     Pulses: Normal pulses. No  decreased pulses.  Musculoskeletal:        General: Tenderness present.     Cervical back: Normal range of motion and neck supple.     Right hip: No tenderness or bony tenderness. Normal range of motion.     Right knee: Effusion present. Decreased range of motion. Tenderness present.     Right ankle: No tenderness. Normal range of motion.  Skin:    General: Skin is warm and dry.  Neurological:     Mental Status: She is alert.     Sensory: No sensory deficit.     Comments: Motor, sensation, and vascular distal to the injury is fully intact.      ED Results / Procedures / Treatments   Labs (all labs ordered are listed, but only abnormal results are displayed) Labs Reviewed  CBC WITH DIFFERENTIAL/PLATELET - Abnormal; Notable for the following components:      Result Value   Monocytes Absolute 1.1 (*)    All other components within normal limits  BASIC METABOLIC PANEL - Abnormal; Notable for the following components:   Sodium 133 (*)    Chloride 96 (*)    Creatinine, Ser 1.47 (*)    GFR, Estimated 32 (*)    All other components within normal limits    EKG None  Radiology DG Pelvis 1-2 Views  Result Date: 11/19/2020 CLINICAL DATA:  01/19/2021 5 days ago.  Pelvic pain. EXAM: PELVIS - 1-2 VIEW COMPARISON:  02/26/2010. FINDINGS: No fracture or bone lesion. Skeletal structures diffusely demineralized. Bilateral axial hip joint space narrowing with marginal osteophytes from the bases of the femoral heads, lying slightly greater on the left. Mild degenerative changes of the inferior SI joints. SI joints normally aligned. Pubic symphysis normally spaced and aligned. Disc degenerative changes noted of the visible lower lumbar spine. Soft tissues are unremarkable other than arterial vascular calcifications. IMPRESSION: 1. No fracture or acute finding. Electronically Signed   By: 04/28/2010 M.D.   On: 11/19/2020 16:26   DG Knee Complete 4 Views Right  Result Date: 11/19/2020 CLINICAL DATA:   Fall 5 days ago.  Right knee pain. EXAM: RIGHT KNEE - COMPLETE 4+ VIEW COMPARISON:  None. FINDINGS: No convincing fracture.  No bone lesion. Tricompartmental arthropathic changes most notably affecting the lateral patellofemoral joint space compartments, with joint space narrowing, mild subchondral sclerosis and prominent marginal osteophytes. Skeletal structures diffusely demineralized. Small joint effusion. There dense arterial vascular calcifications posteriorly. IMPRESSION: 1. No fracture or dislocation. 2. Tricompartmental degenerative/arthropathic changes predominantly involving the lateral and patellofemoral joint space compartments associated with a small joint effusion. Electronically Signed   By: 01/19/2021 M.D.   On: 11/19/2020 16:24    Procedures Procedures   Medications Ordered in  ED Medications - No data to display  ED Course  I have reviewed the triage vital signs and the nursing notes.  Pertinent labs & imaging results that were available during my care of the patient were reviewed by me and considered in my medical decision making (see chart for details).  Patient seen and examined. Work-up initiated. No sign of head injury. Briefly discussed head imaging and patient/family agree to defer. No illness or recent worsening weakness.   Vital signs reviewed and are as follows: BP 124/66 (BP Location: Right Arm)   Pulse 70   Temp 98.3 F (36.8 C) (Oral)   Resp 18   Ht 5\' 4"  (1.626 m)   Wt 80.7 kg   SpO2 99%   BMI 30.55 kg/m   3:38 PM Pt discussed with and seen by Dr. . Labs added.   Lab work was mainly unremarkable.  Mild chronic elevation in creatinine noted.  Mild hyponatremia, unlikely related to injury.  Patient and family at bedside updated on results.  TOC consult was obtained.  Home health orders placed on behalf of patient.  Made arrangement to obtain wheelchair to help with mobility at home.  Family and patient are comfortable with discharge.  Patient is  to daughters who she lives with who can help care for her as well.  Orthopedic referral given.  Encouraged follow-up with any worsening or changing symptoms.    MDM Rules/Calculators/A&P                          Patient with right knee injury after fall several days ago.  She is otherwise in her normal state of health.  She lives at home with 2 children.  X-ray does not demonstrate any fractures in the knee or problems with the pelvis.  Do not feel that head imaging is indicated.  Patient will need help with mobility at home and [facility] consult and arrangements made as above.   Final Clinical Impression(s) / ED Diagnoses Final diagnoses:  Contusion of right knee, initial encounter  Effusion of right knee  Fall, initial encounter    Rx / DC Orders ED Discharge Orders         Ordered    Home Health        11/19/20 1758    Face-to-face encounter (required for Medicare/Medicaid patients)       Comments: I 01/19/21 certify that this patient is under my care and that I, or a nurse practitioner or physician's assistant working with me, had a face-to-face encounter that meets the physician face-to-face encounter requirements with this patient on 11/19/2020. The encounter with the patient was in whole, or in part for the following medical condition(s) which is the primary reason for home health care (List medical condition): knee injury, knee effusion, knee pain   11/19/20 1758    hydrocortisone (ANUSOL-HC) 2.5 % rectal cream  2 times daily        11/19/20 2051           2052, PA-C 11/19/20 2326    01/19/21, DO 11/20/20 1542    [place], Adam, DO 11/20/20 1542

## 2020-11-19 NOTE — ED Provider Notes (Signed)
MSE was initiated and I personally evaluated the patient and placed orders (if any) at  2:32 PM on Nov 19, 2020.  The patient appears stable so that the remainder of the MSE may be completed by another provider.  87 year old female at home 5 days ago injuring her left knee.  She said it was not that bad initially but its been getting more painful.  On exam she is swollen ecchymotic diffusely tender right knee.  Distal pulses intact.   Terrilee Files, MD 11/19/20 1723

## 2020-11-21 ENCOUNTER — Encounter: Payer: Medicare Other | Admitting: Family Medicine

## 2020-11-25 ENCOUNTER — Encounter: Payer: Self-pay | Admitting: Family Medicine

## 2020-11-25 ENCOUNTER — Ambulatory Visit (INDEPENDENT_AMBULATORY_CARE_PROVIDER_SITE_OTHER): Payer: Medicare Other | Admitting: Family Medicine

## 2020-11-25 ENCOUNTER — Other Ambulatory Visit: Payer: Self-pay

## 2020-11-25 VITALS — BP 139/60 | HR 63 | Wt 131.3 lb

## 2020-11-25 DIAGNOSIS — R102 Pelvic and perineal pain: Secondary | ICD-10-CM | POA: Insufficient documentation

## 2020-11-25 DIAGNOSIS — K5901 Slow transit constipation: Secondary | ICD-10-CM | POA: Diagnosis not present

## 2020-11-25 MED ORDER — PRAMOXINE HCL (PERIANAL) 1 % EX FOAM: 1.0000 "application " | Freq: Three times a day (TID) | CUTANEOUS | 0 refills | Status: DC | PRN

## 2020-11-25 MED ORDER — SENNA 8.6 MG PO TABS: 1.0000 | ORAL_TABLET | Freq: Every evening | ORAL | 0 refills | Status: DC | PRN

## 2020-11-25 MED ORDER — METRONIDAZOLE 500 MG PO TABS
500.0000 mg | ORAL_TABLET | Freq: Two times a day (BID) | ORAL | 0 refills | Status: DC
Start: 2020-11-25 — End: 2020-11-30

## 2020-11-25 NOTE — Assessment & Plan Note (Signed)
Added Senna and Proctofoam

## 2020-11-25 NOTE — Assessment & Plan Note (Signed)
?   Gas in uterus, s/p 1 wk of doxy, do not think this is causing her pain. Will check u/s.  Trial of Flagyl.

## 2020-11-25 NOTE — Progress Notes (Signed)
   Subjective:    Patient ID: Sherry [name] is a 87 y.o. female presenting with No chief complaint on file.  on 11/25/2020  HPI: Was seen ED on 4/21 and found to have gas in uterus. Treated for 1 week of Doxycycline. Pain is worse and right sided. No postmenopausal bleeding. No passage of stool or gas vaginally. Also with some pain with defecation and has some constipation. On Anusol, and stool softener which is not helping.  Review of Systems  Constitutional: Negative for chills and fever.  Respiratory: Negative for shortness of breath.   Cardiovascular: Negative for chest pain.  Gastrointestinal: Positive for abdominal pain (RLQ). Negative for nausea and vomiting.  Genitourinary: Negative for dysuria.  Skin: Negative for rash.      Objective:    BP 139/60   Pulse 63   Wt 131 lb 4.8 oz (59.6 kg)   BMI 22.54 kg/m  Physical Exam Constitutional:      General: She is not in acute distress.    Appearance: She is well-developed.  HENT:     Head: Normocephalic and atraumatic.  Eyes:     General: No scleral icterus. Cardiovascular:     Rate and Rhythm: Normal rate.  Pulmonary:     Effort: Pulmonary effort is normal.  Abdominal:     Palpations: Abdomen is soft.  Genitourinary:    Comments: BUS normal, vagina is atrophic, cervix is parous without lesion, uterus is small and non-tender, no adnexal mass or tenderness.  Musculoskeletal:     Cervical back: Neck supple.  Skin:    General: Skin is warm and dry.  Neurological:     Mental Status: She is alert and oriented to person, place, and time.         Assessment & Plan:   Problem List Items Addressed This Visit      Unprioritized   Pelvic pain - Primary    ? Gas in uterus, s/p 1 wk of doxy, do not think this is causing her pain. Will check u/s.  Trial of Flagyl.      Relevant Medications   metroNIDAZOLE (FLAGYL) 500 MG tablet   Other Relevant Orders   US PELVIC COMPLETE WITH TRANSVAGINAL   Slow transit  constipation    Added Senna and Proctofoam      Relevant Medications   pramoxine (PROCTOFOAM) 1 % foam   senna (SENOKOT) 8.6 MG TABS tablet      No follow-ups on file.  Reva Bores 11/25/2020 8:31 PM

## 2020-11-30 ENCOUNTER — Other Ambulatory Visit: Payer: Self-pay

## 2020-11-30 ENCOUNTER — Emergency Department (HOSPITAL_COMMUNITY)
Admission: EM | Admit: 2020-11-30 | Discharge: 2020-11-30 | Disposition: A | Discharge status: Home / Self Care | Payer: Medicare Other | Attending: Emergency Medicine | Admitting: Emergency Medicine

## 2020-11-30 ENCOUNTER — Emergency Department (HOSPITAL_COMMUNITY): Payer: Medicare Other

## 2020-11-30 ENCOUNTER — Encounter (HOSPITAL_COMMUNITY): Payer: Self-pay | Admitting: Emergency Medicine

## 2020-11-30 DIAGNOSIS — R059 Cough, unspecified: Secondary | ICD-10-CM | POA: Diagnosis not present

## 2020-11-30 DIAGNOSIS — I10 Essential (primary) hypertension: Secondary | ICD-10-CM | POA: Diagnosis not present

## 2020-11-30 DIAGNOSIS — R0789 Other chest pain: Secondary | ICD-10-CM | POA: Insufficient documentation

## 2020-11-30 DIAGNOSIS — Z87891 Personal history of nicotine dependence: Secondary | ICD-10-CM | POA: Diagnosis not present

## 2020-11-30 DIAGNOSIS — Z7982 Long term (current) use of aspirin: Secondary | ICD-10-CM | POA: Insufficient documentation

## 2020-11-30 DIAGNOSIS — Z20822 Contact with and (suspected) exposure to covid-19: Secondary | ICD-10-CM | POA: Insufficient documentation

## 2020-11-30 DIAGNOSIS — J40 Bronchitis, not specified as acute or chronic: Secondary | ICD-10-CM

## 2020-11-30 DIAGNOSIS — R079 Chest pain, unspecified: Secondary | ICD-10-CM | POA: Diagnosis present

## 2020-11-30 DIAGNOSIS — Z79899 Other long term (current) drug therapy: Secondary | ICD-10-CM | POA: Diagnosis not present

## 2020-11-30 LAB — CBC
HCT: 43.4 % (ref 36.0–46.0)
Hemoglobin: 14 g/dL (ref 12.0–15.0)
MCH: 27.7 pg (ref 26.0–34.0)
MCHC: 32.3 g/dL (ref 30.0–36.0)
MCV: 85.9 fL (ref 80.0–100.0)
Platelets: 346 10*3/uL (ref 150–400)
RBC: 5.05 MIL/uL (ref 3.87–5.11)
RDW: 13.5 % (ref 11.5–15.5)
WBC: 5.7 10*3/uL (ref 4.0–10.5)
nRBC: 0 % (ref 0.0–0.2)

## 2020-11-30 LAB — COMPREHENSIVE METABOLIC PANEL
ALT: 11 U/L (ref 0–44)
AST: 25 U/L (ref 15–41)
Albumin: 3.3 g/dL — ABNORMAL LOW (ref 3.5–5.0)
Alkaline Phosphatase: 117 U/L (ref 38–126)
Anion gap: 8 (ref 5–15)
BUN: 13 mg/dL (ref 8–23)
CO2: 27 mmol/L (ref 22–32)
Calcium: 9.5 mg/dL (ref 8.9–10.3)
Chloride: 100 mmol/L (ref 98–111)
Creatinine, Ser: 1.29 mg/dL — ABNORMAL HIGH (ref 0.44–1.00)
GFR, Estimated: 37 mL/min — ABNORMAL LOW (ref >60)
Glucose, Bld: 86 mg/dL (ref 70–99)
Potassium: 4.2 mmol/L (ref 3.5–5.1)
Sodium: 135 mmol/L (ref 135–145)
Total Bilirubin: 0.8 mg/dL (ref 0.3–1.2)
Total Protein: 7.2 g/dL (ref 6.5–8.1)

## 2020-11-30 LAB — TROPONIN I (HIGH SENSITIVITY): Troponin I (High Sensitivity): 6 ng/L (ref <18)

## 2020-11-30 LAB — SARS CORONAVIRUS 2 (TAT 6-24 HRS): SARS Coronavirus 2: NEGATIVE

## 2020-11-30 LAB — LIPASE, BLOOD: Lipase: 28 U/L (ref 11–51)

## 2020-11-30 MED ORDER — LEVOFLOXACIN 250 MG PO TABS: 250.0000 mg | ORAL_TABLET | Freq: Every day | ORAL | 0 refills | Status: DC

## 2020-11-30 MED ORDER — LIDOCAINE 5 % EX PTCH
1.0000 | MEDICATED_PATCH | CUTANEOUS | Status: DC
  Administered 2020-11-30: 1 via TRANSDERMAL
  Filled 2020-11-30: qty 1

## 2020-11-30 MED ORDER — HYDROCORT-PRAMOXINE (PERIANAL) 1-1 % EX FOAM
1.0000 | Freq: Once | CUTANEOUS | Status: AC
  Administered 2020-11-30: 1 via RECTAL
  Filled 2020-11-30: qty 10

## 2020-11-30 MED ORDER — ACETAMINOPHEN 500 MG PO TABS
1000.0000 mg | ORAL_TABLET | Freq: Once | ORAL | Status: AC
  Administered 2020-11-30: 1000 mg via ORAL
  Filled 2020-11-30: qty 2

## 2020-11-30 MED ORDER — LIDOCAINE 5 % EX PTCH: 1.0000 | MEDICATED_PATCH | CUTANEOUS | 0 refills | Status: DC

## 2020-11-30 NOTE — ED Notes (Signed)
Lab  Reports looking for and running cbc now.

## 2020-11-30 NOTE — ED Notes (Signed)
Pt stated she needed to use the bathroom, this tech asked her which way she preferred to use the bathroom and she stated she used a purewick last time she was here. Purewick was placed on pt @ 16:13.

## 2020-11-30 NOTE — ED Provider Notes (Signed)
[facility] EMERGENCY DEPARTMENT Provider Note   CSN: [id number] Arrival date & time: 11/30/20  1203     History Chief Complaint  Patient presents with  . Chest Pain    Sherry [name] is a 87 y.o. female.  The history is provided by the patient.  Chest Pain Pain location:  R chest Pain radiates to:  Does not radiate Pain severity:  Mild Onset quality:  Gradual Timing:  Intermittent Progression:  Waxing and waning Chronicity:  New Context: breathing and movement   Relieved by:  Nothing Worsened by:  Nothing Associated symptoms: cough   Associated symptoms: no abdominal pain, no back pain, no claudication, no fever, no palpitations, no shortness of breath and no vomiting   Risk factors: hypertension        Past Medical History:  Diagnosis Date  . Acid reflux   . Arthritis   . Hypertension     Patient Active Problem List   Diagnosis Date Noted  . Pelvic pain 11/25/2020  . Slow transit constipation 11/25/2020  . Atrial fibrillation (HCC) 10/17/2020    Past Surgical History:  Procedure Laterality Date  . NO PAST SURGERIES       OB History    Gravida  11   Para      Term      Preterm      AB  1   Living  10     SAB  1   IAB      Ectopic      Multiple      Live Births  10           History reviewed. No pertinent family history.  Social History   Tobacco Use  . Smoking status: Former Games developer  . Smokeless tobacco: Never Used  Substance Use Topics  . Alcohol use: No  . Drug use: No    Home Medications Prior to Admission medications   Medication Sig Start Date End Date Taking? Authorizing Provider  acetaminophen (TYLENOL) 500 MG tablet Take 500 mg by mouth every 6 (six) hours as needed for mild pain, fever or headache.   Yes [provider]  aspirin EC 81 MG tablet Take 81 mg by mouth daily. Swallow whole.   Yes [provider]  aspirin-sod bicarb-citric acid (ALKA-SELTZER) 325 MG TBEF tablet  Take 325 mg by mouth every 6 (six) hours as needed (congestion).   Yes [provider]  atorvastatin (LIPITOR) 20 MG tablet Take 20 mg by mouth daily.   Yes [provider]  diltiazem (CARDIZEM) 30 MG tablet Take 30 mg by mouth 2 (two) times daily. 01/02/20  Yes [provider]  famotidine (PEPCID) 20 MG tablet Take 20 mg by mouth daily. 08/20/20  Yes [provider]  hydrocortisone (ANUSOL-HC) 2.5 % rectal cream Place 1 application rectally 2 (two) times daily. 11/19/20  Yes Renne Crigler, PA-C  losartan (COZAAR) 50 MG tablet Take 50 mg by mouth daily. 10/26/16  Yes [provider]  metoCLOPramide (REGLAN) 5 MG tablet Take 5 mg by mouth 2 (two) times daily. 10/03/20  Yes [provider]  metoprolol tartrate (LOPRESSOR) 50 MG tablet Take 50 mg by mouth 2 (two) times daily. 05/06/20  Yes [provider]  metroNIDAZOLE (FLAGYL) 500 MG tablet Take 1 tablet (500 mg total) by mouth 2 (two) times daily for 7 days. 11/25/20 12/02/20 Yes Reva Bores, MD  pramoxine (PROCTOFOAM) 1 % foam Place 1 application rectally 3 (three)  times daily as needed for anal itching. 11/25/20  Yes Reva Bores, MD  senna (SENOKOT) 8.6 MG TABS tablet Take 1 tablet (8.6 mg total) by mouth at bedtime as needed for mild constipation. 11/25/20  Yes Reva Bores, MD  doxycycline (VIBRAMYCIN) 100 MG capsule Take 1 capsule (100 mg total) by mouth 2 (two) times daily. Patient not taking: No sig reported 11/07/20   Margarita Grizzle, MD  VITAMIN D PO Take 1 capsule by mouth daily. Patient not taking: Reported on 11/30/2020    [provider]    Allergies    Patient has no known allergies.  Review of Systems   Review of Systems  Constitutional: Negative for chills and fever.  HENT: Negative for ear pain and sore throat.   Eyes: Negative for pain and visual disturbance.  Respiratory: Positive for cough. Negative for shortness of breath.   Cardiovascular: Positive for chest  pain. Negative for palpitations and claudication.  Gastrointestinal: Negative for abdominal pain and vomiting.  Genitourinary: Negative for dysuria and hematuria.  Musculoskeletal: Negative for arthralgias and back pain.  Skin: Negative for color change and rash.  Neurological: Negative for seizures and syncope.  All other systems reviewed and are negative.   Physical Exam Updated Vital Signs BP 139/80   Pulse 91   Temp 99.3 F (37.4 C) (Oral)   Resp (!) 21   SpO2 99%   Physical Exam Vitals and nursing note reviewed.  Constitutional:      General: She is not in acute distress.    Appearance: She is well-developed. She is not ill-appearing.  HENT:     Head: Normocephalic and atraumatic.  Eyes:     Extraocular Movements: Extraocular movements intact.     Conjunctiva/sclera: Conjunctivae normal.     Pupils: Pupils are equal, round, and reactive to light.  Cardiovascular:     Rate and Rhythm: Normal rate and regular rhythm.     Pulses:          Radial pulses are 2+ on the right side and 2+ on the left side.     Heart sounds: No murmur heard.   Pulmonary:     Effort: Pulmonary effort is normal. No respiratory distress.     Breath sounds: Normal breath sounds. No decreased breath sounds, wheezing or rhonchi.  Chest:     Chest wall: Tenderness present.  Abdominal:     Palpations: Abdomen is soft.     Tenderness: There is no abdominal tenderness.  Musculoskeletal:        General: Normal range of motion.     Cervical back: Normal range of motion and neck supple.     Right lower leg: No edema.     Left lower leg: No edema.  Skin:    General: Skin is warm and dry.     Capillary Refill: Capillary refill takes less than 2 seconds.  Neurological:     General: No focal deficit present.     Mental Status: She is alert.  Psychiatric:        Mood and Affect: Mood normal.     ED Results / Procedures / Treatments   Labs (all labs ordered are listed, but only abnormal results  are displayed) Labs Reviewed  CBC WITH DIFFERENTIAL/PLATELET  COMPREHENSIVE METABOLIC PANEL  LIPASE, BLOOD  TROPONIN I (HIGH SENSITIVITY)    EKG EKG Interpretation  Date/Time:  Saturday Nov 30 2020 12:20:42 EDT Ventricular Rate:  88 PR Interval:    QRS Duration: 141  QT Interval:  395 QTC Calculation: 478 R Axis:   -54 Text Interpretation: Atrial fibrillation Right bundle branch block Inferior infarct, old Confirmed by Lockie Mola, Bryauna Byrum [phone]) on 11/30/2020 12:30:12 PM   Radiology DG Chest 2 View  Result Date: 11/30/2020 CLINICAL DATA:  Right-sided chest pain EXAM: CHEST - 2 VIEW COMPARISON:  11/07/2020 FINDINGS: Grossly unchanged enlarged cardiac silhouette and mediastinal contours with atherosclerotic plaque within a tortuous and potentially ectatic thoracic aorta. No discrete focal airspace opacities. No pleural effusion or pneumothorax no evidence of edema. No definite acute osseous abnormalities. 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 Severe degenerative change the bilateral glenohumeral joints, grossly unchanged. IMPRESSION: No explanation for patient's right-sided chest pain. Electronically Signed   By: Simonne Come M.D.   On: 11/30/2020 13:32    Procedures Procedures   Medications Ordered in ED Medications - No data to display  ED Course  I have reviewed the triage vital signs and the nursing notes.  Pertinent labs & imaging results that were available during my care of the patient were reviewed by me and considered in my medical decision making (see chart for details).    MDM Rules/Calculators/A&P                          Sherry [name] is here with chest pain.  History of A. fib, hypertension who presents with right-sided chest pain.  Seems to hurt worse when she moves or takes a deep breath.  Does not feel short of breath, has had a bad cough with some sputum production.  She has been coughing for a long time however.  She has had some recent  falls but right-sided pain just started today.  Seems slightly reproducible on exam.  There is no rash.  Doubt zoster.  Atypical for ACS.  Not tachycardic or hypoxic and have lower suspicion for PE.  Could be referred pain from the abdomen and will check gallbladder, liver enzymes as well as lipase.  We will get chest x-ray to evaluate for pneumonia or pneumothorax or rib fractures.  We will check a troponin.  Suspect that pain could be secondary from cough and chest wall discomfort.  Chest x-ray shows no evidence of pneumonia, pneumothorax, pleural effusion. Signed out to oncoming ED staff with patient pending blood work.  This chart was dictated using voice recognition software.  Despite best efforts to proofread,  errors can occur which can change the documentation meaning.    Final Clinical Impression(s) / ED Diagnoses Final diagnoses:  Atypical chest pain    Rx / DC Orders ED Discharge Orders    None       Virgina Norfolk, DO 11/30/20 1525

## 2020-11-30 NOTE — ED Triage Notes (Addendum)
Pt bib [facility] from home for RUQ abd pain x3-4 days. Pt states pain is worse when she takes a deep breath, reports 1 episode of emesis 2 days ago, productive cough w/ white mucus. Pt aox4, vss. Hx afib. Pt reports fall 2 weeks ago, uses walker

## 2020-11-30 NOTE — ED Notes (Signed)
CBC recollected  

## 2020-11-30 NOTE — ED Provider Notes (Signed)
Pt signed out by Dr. Lockie Mola pending work up.  Labs are unremarkable.  Pt c/o right sided chest pain when she coughs.  She has been coughing up a lot of sputum.  She was on doxy and flagyl and they made her feel "as sick as a dog."  She has stopped taking both.  The pt does not have a pneumonia on CXR.  Pt has developed a low grade fever.  She will be started on levaquin and she will be swabbed for covid prior to d/c.  Chest pain does not seem to be cardiac.  She is not hypoxic and looks nontoxic.  She is stable for d/c.  Return if worse.  F/u with pcp.   Jacalyn Lefevre, MD 11/30/20 [phone]

## 2020-11-30 NOTE — ED Notes (Signed)
Pt vss on ccm, nadn. Pt reports relief of s/s with patch. Pt discharged with family. Pt able to pivot to chair.

## 2020-12-05 ENCOUNTER — Other Ambulatory Visit: Payer: Self-pay

## 2020-12-05 ENCOUNTER — Ambulatory Visit (INDEPENDENT_AMBULATORY_CARE_PROVIDER_SITE_OTHER): Payer: Medicare Other

## 2020-12-05 ENCOUNTER — Ambulatory Visit: Payer: Self-pay

## 2020-12-05 ENCOUNTER — Ambulatory Visit (INDEPENDENT_AMBULATORY_CARE_PROVIDER_SITE_OTHER): Payer: Medicare Other | Admitting: Orthopaedic Surgery

## 2020-12-05 ENCOUNTER — Encounter: Payer: Self-pay | Admitting: Orthopaedic Surgery

## 2020-12-05 VITALS — Ht 64.0 in | Wt 131.0 lb

## 2020-12-05 DIAGNOSIS — G8929 Other chronic pain: Secondary | ICD-10-CM

## 2020-12-05 DIAGNOSIS — M25571 Pain in right ankle and joints of right foot: Secondary | ICD-10-CM

## 2020-12-05 DIAGNOSIS — M25572 Pain in left ankle and joints of left foot: Secondary | ICD-10-CM

## 2020-12-05 MED ORDER — TRAMADOL HCL 50 MG PO TABS: ORAL_TABLET | ORAL | 2 refills | Status: DC

## 2020-12-05 NOTE — Progress Notes (Signed)
Office Visit Note   Patient: Sherry [name]           Date of Birth: Jun 16, 1920           MRN: [id number] Visit Date: 12/05/2020              Requested by: Orpah Cobb, MD [address] [place],  [place] [address] PCP: Orpah Cobb, MD   Assessment & Plan: Visit Diagnoses:  1. Chronic pain of both ankles     Plan: Impression is advanced degenerative joint disease right knee and bilateral ankle peroneal tendinitis.  We have discussed various treatment options for both problems to include cortisone injection to the knee as well as topical anti-inflammatories to the knee and both ankles.  She is not interested in an injection at this point would like to try the topical anti-inflammatories for now.  I have also called in a small prescription of tramadol to take as needed.  Follow-up with [name] as needed.  Follow-Up Instructions: Return if symptoms worsen or fail to improve.   Orders:  Orders Placed This Encounter  Procedures  . XR Ankle Complete Left  . XR Ankle Complete Right   No orders of the defined types were placed in this encounter.     Procedures: No procedures performed   Clinical Data: No additional findings.   Subjective: Chief Complaint  Patient presents with  . Right Knee - Pain    HPI patient is a very pleasant 87 year old female who is here today with her daughter.  She is here with right knee pain and bilateral ankle pain after sustaining a fall approximately 2 months ago.  She was seen in the ED where x-rays of the knee were obtained and were negative for fracture.  She comes in today for further evaluation treatment recommendation.  She notes that she has primarily ambulatory with a walker at baseline but occasionally uses a wheelchair.  She has been having trouble ambulating due to her knee and ankle pain since the fall.  The knee pain is primarily to the medial aspect and worse with walking.  She has been using Aspercreme and Tylenol without  significant relief.  No previous cortisone injection.  The ankle pain is to the lateral aspect of both sides and is described as a constant pain worse with ambulation.  Review of Systems as detailed in HPI.  All others reviewed and are negative.   Objective: Vital Signs: Ht 5\' 4"  (1.626 m)   Wt 131 lb (59.4 kg)   BMI 22.49 kg/m   Physical Exam well-developed well-nourished female no acute distress.  Alert oriented x3.  Ortho Exam right knee exam shows trace effusion.  Range of motion 30 to 90 degrees.  Medial joint line tenderness.  Moderate patellofemoral crepitus.  Ligaments are stable.  She is neurovascular intact distally.  Bilateral ankle exam shows minimal swelling.  She has tenderness along the peroneal tendons on both sides.  No bony tenderness.  Painless range of motion.  She is neurovascular intact distally.  Specialty Comments:  No specialty comments available.  Imaging: No results found.   PMFS History: Patient Active Problem List   Diagnosis Date Noted  . Pelvic pain 11/25/2020  . Slow transit constipation 11/25/2020  . Atrial fibrillation (HCC) 10/17/2020   Past Medical History:  Diagnosis Date  . Acid reflux   . Arthritis   . Hypertension     History reviewed. No pertinent family history.  Past Surgical History:  Procedure Laterality Date  . NO PAST SURGERIES     Social History   Occupational History  . Not on file  Tobacco Use  . Smoking status: Former Games developer  . Smokeless tobacco: Never Used  Substance and Sexual Activity  . Alcohol use: No  . Drug use: No  . Sexual activity: Not Currently    Birth control/protection: None

## 2020-12-10 ENCOUNTER — Emergency Department (HOSPITAL_COMMUNITY)
Admission: EM | Admit: 2020-12-10 | Discharge: 2020-12-10 | Disposition: A | Discharge status: Home / Self Care | Payer: Medicare Other | Attending: Emergency Medicine | Admitting: Emergency Medicine

## 2020-12-10 ENCOUNTER — Other Ambulatory Visit: Payer: Self-pay

## 2020-12-10 ENCOUNTER — Encounter (HOSPITAL_COMMUNITY): Payer: Self-pay

## 2020-12-10 ENCOUNTER — Emergency Department (HOSPITAL_COMMUNITY): Payer: Medicare Other

## 2020-12-10 DIAGNOSIS — R0789 Other chest pain: Secondary | ICD-10-CM | POA: Diagnosis present

## 2020-12-10 DIAGNOSIS — Z87891 Personal history of nicotine dependence: Secondary | ICD-10-CM | POA: Insufficient documentation

## 2020-12-10 DIAGNOSIS — R053 Chronic cough: Secondary | ICD-10-CM | POA: Diagnosis not present

## 2020-12-10 DIAGNOSIS — I10 Essential (primary) hypertension: Secondary | ICD-10-CM | POA: Diagnosis not present

## 2020-12-10 DIAGNOSIS — Z79899 Other long term (current) drug therapy: Secondary | ICD-10-CM | POA: Insufficient documentation

## 2020-12-10 DIAGNOSIS — Z7982 Long term (current) use of aspirin: Secondary | ICD-10-CM | POA: Insufficient documentation

## 2020-12-10 DIAGNOSIS — M25512 Pain in left shoulder: Secondary | ICD-10-CM | POA: Diagnosis not present

## 2020-12-10 DIAGNOSIS — R1110 Vomiting, unspecified: Secondary | ICD-10-CM | POA: Diagnosis not present

## 2020-12-10 DIAGNOSIS — M25511 Pain in right shoulder: Secondary | ICD-10-CM | POA: Insufficient documentation

## 2020-12-10 LAB — COMPREHENSIVE METABOLIC PANEL
ALT: 25 U/L (ref 0–44)
AST: 71 U/L — ABNORMAL HIGH (ref 15–41)
Albumin: 2.4 g/dL — ABNORMAL LOW (ref 3.5–5.0)
Alkaline Phosphatase: 77 U/L (ref 38–126)
Anion gap: 9 (ref 5–15)
BUN: 27 mg/dL — ABNORMAL HIGH (ref 8–23)
CO2: 24 mmol/L (ref 22–32)
Calcium: 8.9 mg/dL (ref 8.9–10.3)
Chloride: 97 mmol/L — ABNORMAL LOW (ref 98–111)
Creatinine, Ser: 1.38 mg/dL — ABNORMAL HIGH (ref 0.44–1.00)
GFR, Estimated: 34 mL/min — ABNORMAL LOW (ref >60)
Glucose, Bld: 88 mg/dL (ref 70–99)
Potassium: 3.9 mmol/L (ref 3.5–5.1)
Sodium: 130 mmol/L — ABNORMAL LOW (ref 135–145)
Total Bilirubin: 1.1 mg/dL (ref 0.3–1.2)
Total Protein: 6.1 g/dL — ABNORMAL LOW (ref 6.5–8.1)

## 2020-12-10 LAB — CBC WITH DIFFERENTIAL/PLATELET
Abs Immature Granulocytes: 0.02 10*3/uL (ref 0.00–0.07)
Basophils Absolute: 0 10*3/uL (ref 0.0–0.1)
Basophils Relative: 0 %
Eosinophils Absolute: 0.1 10*3/uL (ref 0.0–0.5)
Eosinophils Relative: 1 %
HCT: 36.8 % (ref 36.0–46.0)
Hemoglobin: 12 g/dL (ref 12.0–15.0)
Immature Granulocytes: 0 %
Lymphocytes Relative: 14 %
Lymphs Abs: 0.7 10*3/uL (ref 0.7–4.0)
MCH: 27.6 pg (ref 26.0–34.0)
MCHC: 32.6 g/dL (ref 30.0–36.0)
MCV: 84.8 fL (ref 80.0–100.0)
Monocytes Absolute: 0.8 10*3/uL (ref 0.1–1.0)
Monocytes Relative: 17 %
Neutro Abs: 3.4 10*3/uL (ref 1.7–7.7)
Neutrophils Relative %: 68 %
Platelets: 341 10*3/uL (ref 150–400)
RBC: 4.34 MIL/uL (ref 3.87–5.11)
RDW: 14 % (ref 11.5–15.5)
WBC: 5 10*3/uL (ref 4.0–10.5)
nRBC: 0 % (ref 0.0–0.2)

## 2020-12-10 LAB — TROPONIN I (HIGH SENSITIVITY): Troponin I (High Sensitivity): 13 ng/L (ref <18)

## 2020-12-10 MED ORDER — DICLOFENAC SODIUM 1 % EX GEL
4.0000 g | Freq: Once | CUTANEOUS | Status: DC
  Filled 2020-12-10: qty 100

## 2020-12-10 MED ORDER — ACETAMINOPHEN 500 MG PO TABS
1000.0000 mg | ORAL_TABLET | Freq: Once | ORAL | Status: AC
  Administered 2020-12-10: 1000 mg via ORAL
  Filled 2020-12-10: qty 2

## 2020-12-10 MED ORDER — DICLOFENAC SODIUM 1 % EX GEL: 2.0000 g | Freq: Four times a day (QID) | CUTANEOUS | 1 refills | Status: DC
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
## 2020-12-10 NOTE — Discharge Instructions (Signed)
Everything with the heart today looks normal.  However if she develops trouble breathing, cough with lots of mucus, fever, vomiting or confusion or decreased responsiveness please return to the emergency room.  You can use the cream up to 4 times a day to help with pain.

## 2020-12-10 NOTE — ED Triage Notes (Signed)
PT BIB [facility] from home c/o chest pain. Left arm and shoulder pain that has been going on for 2 weeks since a fall. Pt is able to relief pain with repositioning. Pt has a hx of a-fib and was in A-fib with EMS.

## 2020-12-12 ENCOUNTER — Telehealth: Payer: Self-pay | Admitting: *Deleted

## 2020-12-12 NOTE — Telephone Encounter (Signed)
Received a voice message this pm from [employer], states she is RN with [facility]. States she is calling re: Keyanna's antibiotics. Request a call back. Sherman Lipuma,RN

## 2020-12-12 NOTE — Telephone Encounter (Signed)
I called Crystal back and she states daughter reported to nurse on Tuesday urine dark but didn't tell her she is on antibiotic. States she told family they need to give patient water 4-6 glasses of water a day, because they weren't giving her water.  States she called PCP to get urine culture order . States family said we gave her antibiotic last week that changed from previous. Nurse wanting to clarify antibiotic orders because states should not do culture until off antibiotics 3 days. I informed her the last antbiiotic we gave her was Flagy at her last visit with [name] 11/25/20 . I informed she had been ordered levaquin by ER MD. She voices understanding. Makaria Poarch,RN

## 2021-02-10 ENCOUNTER — Ambulatory Visit: Payer: Medicare Other | Admitting: Podiatry

## 2021-03-27 ENCOUNTER — Emergency Department (HOSPITAL_COMMUNITY)
Admission: EM | Admit: 2021-03-27 | Discharge: 2021-03-27 | Disposition: A | Discharge status: Home / Self Care | Payer: Medicare Other | Attending: Student | Admitting: Student

## 2021-03-27 ENCOUNTER — Emergency Department (HOSPITAL_COMMUNITY): Payer: Medicare Other

## 2021-03-27 DIAGNOSIS — M25511 Pain in right shoulder: Secondary | ICD-10-CM | POA: Insufficient documentation

## 2021-03-27 DIAGNOSIS — I10 Essential (primary) hypertension: Secondary | ICD-10-CM | POA: Diagnosis not present

## 2021-03-27 DIAGNOSIS — M79673 Pain in unspecified foot: Secondary | ICD-10-CM

## 2021-03-27 DIAGNOSIS — Z7982 Long term (current) use of aspirin: Secondary | ICD-10-CM | POA: Diagnosis not present

## 2021-03-27 DIAGNOSIS — Z87891 Personal history of nicotine dependence: Secondary | ICD-10-CM | POA: Diagnosis not present

## 2021-03-27 DIAGNOSIS — Z79899 Other long term (current) drug therapy: Secondary | ICD-10-CM | POA: Insufficient documentation

## 2021-03-27 DIAGNOSIS — M79672 Pain in left foot: Secondary | ICD-10-CM | POA: Diagnosis not present

## 2021-03-27 DIAGNOSIS — R1030 Lower abdominal pain, unspecified: Secondary | ICD-10-CM | POA: Diagnosis not present

## 2021-03-27 DIAGNOSIS — R350 Frequency of micturition: Secondary | ICD-10-CM | POA: Insufficient documentation

## 2021-03-27 DIAGNOSIS — M79671 Pain in right foot: Secondary | ICD-10-CM | POA: Insufficient documentation

## 2021-03-27 DIAGNOSIS — N390 Urinary tract infection, site not specified: Secondary | ICD-10-CM

## 2021-03-27 LAB — CBC WITH DIFFERENTIAL/PLATELET
Abs Immature Granulocytes: 0.02 10*3/uL (ref 0.00–0.07)
Basophils Absolute: 0 10*3/uL (ref 0.0–0.1)
Basophils Relative: 0 %
Eosinophils Absolute: 0 10*3/uL (ref 0.0–0.5)
Eosinophils Relative: 0 %
HCT: 44.4 % (ref 36.0–46.0)
Hemoglobin: 13.8 g/dL (ref 12.0–15.0)
Immature Granulocytes: 0 %
Lymphocytes Relative: 16 %
Lymphs Abs: 0.9 10*3/uL (ref 0.7–4.0)
MCH: 26.6 pg (ref 26.0–34.0)
MCHC: 31.1 g/dL (ref 30.0–36.0)
MCV: 85.5 fL (ref 80.0–100.0)
Monocytes Absolute: 0.6 10*3/uL (ref 0.1–1.0)
Monocytes Relative: 12 %
Neutro Abs: 3.9 10*3/uL (ref 1.7–7.7)
Neutrophils Relative %: 72 %
Platelets: 267 10*3/uL (ref 150–400)
RBC: 5.19 MIL/uL — ABNORMAL HIGH (ref 3.87–5.11)
RDW: 14.3 % (ref 11.5–15.5)
WBC: 5.4 10*3/uL (ref 4.0–10.5)
nRBC: 0 % (ref 0.0–0.2)

## 2021-03-27 LAB — COMPREHENSIVE METABOLIC PANEL
ALT: 12 U/L (ref 0–44)
AST: 32 U/L (ref 15–41)
Albumin: 3.1 g/dL — ABNORMAL LOW (ref 3.5–5.0)
Alkaline Phosphatase: 114 U/L (ref 38–126)
Anion gap: 8 (ref 5–15)
BUN: 27 mg/dL — ABNORMAL HIGH (ref 8–23)
CO2: 24 mmol/L (ref 22–32)
Calcium: 9.4 mg/dL (ref 8.9–10.3)
Chloride: 102 mmol/L (ref 98–111)
Creatinine, Ser: 1.18 mg/dL — ABNORMAL HIGH (ref 0.44–1.00)
GFR, Estimated: 41 mL/min — ABNORMAL LOW (ref >60)
Glucose, Bld: 116 mg/dL — ABNORMAL HIGH (ref 70–99)
Potassium: 4.3 mmol/L (ref 3.5–5.1)
Sodium: 134 mmol/L — ABNORMAL LOW (ref 135–145)
Total Bilirubin: 0.9 mg/dL (ref 0.3–1.2)
Total Protein: 7.2 g/dL (ref 6.5–8.1)

## 2021-03-27 LAB — URINALYSIS, ROUTINE W REFLEX MICROSCOPIC
Bilirubin Urine: NEGATIVE
Glucose, UA: NEGATIVE mg/dL
Hgb urine dipstick: NEGATIVE
Ketones, ur: NEGATIVE mg/dL
Nitrite: NEGATIVE
Protein, ur: NEGATIVE mg/dL
Specific Gravity, Urine: 1.019 (ref 1.005–1.030)
pH: 6 (ref 5.0–8.0)

## 2021-03-27 LAB — LIPASE, BLOOD: Lipase: 24 U/L (ref 11–51)

## 2021-03-27 MED ORDER — CEPHALEXIN 500 MG PO CAPS: 500.0000 mg | ORAL_CAPSULE | Freq: Two times a day (BID) | ORAL | 0 refills | Status: AC

## 2021-03-27 NOTE — Progress Notes (Signed)
Orthopedic Tech Progress Note Patient Details:  Sherry [name] June 13, 1920 [id number] Went to apply a post-op shoe to patient's right foot per order from MD. Patient stated most of her pain was coming from the left foot and also some from the right foot as well. I applied a post-op shoe to both feet.  Ortho Devices Type of Ortho Device: Postop shoe/boot Ortho Device/Splint Location: Bi LE Ortho Device/Splint Interventions: Application   Post Interventions Patient Tolerated: Well  Genelle Bal Colton Engdahl 03/27/2021, 5:45 PM

## 2021-03-27 NOTE — ED Notes (Signed)
Pt placed on purewic 

## 2021-03-27 NOTE — ED Notes (Signed)
ED Provider at bedside. 

## 2021-03-27 NOTE — ED Provider Notes (Signed)
Patient received at handoff, see previous physician note for full details.  87 year old female presents the ER secondary to right-sided foot pain, dysuria.  Accompanied by family member at bedside.  No nausea or vomiting.  She is tolerant oral intake without significant difficulty.  Difficulty ambulating secondary to right foot pain.  XR non-acute. At this time patient pending urinalysis for discharge. Physical Exam  BP (!) 126/59   Pulse 78   Temp 97.7 F (36.5 C) (Oral)   Resp (!) 25   SpO2 95%   Physical Exam Vitals and nursing note reviewed.  Constitutional:      General: She is not in acute distress.    Appearance: Normal appearance.  HENT:     Head: Normocephalic and atraumatic.     Right Ear: External ear normal.     Left Ear: External ear normal.     Nose: Nose normal.     Mouth/Throat:     Mouth: Mucous membranes are moist.  Eyes:     General: No scleral icterus.       Right eye: No discharge.        Left eye: No discharge.  Cardiovascular:     Rate and Rhythm: Normal rate and regular rhythm.     Pulses: Normal pulses.     Heart sounds: Normal heart sounds.  Pulmonary:     Effort: Pulmonary effort is normal. No respiratory distress.     Breath sounds: Normal breath sounds.  Abdominal:     General: Abdomen is flat.     Tenderness: There is no abdominal tenderness.  Musculoskeletal:        General: Normal range of motion.     Cervical back: Normal range of motion.     Right lower leg: No edema.     Left lower leg: No edema.     Comments: Pain to palpation right foot.  Neurovascularly intact bilateral lower extremities.  Skin:    General: Skin is warm and dry.     Capillary Refill: Capillary refill takes less than 2 seconds.  Neurological:     Mental Status: She is alert.  Psychiatric:        Mood and Affect: Mood normal.        Behavior: Behavior normal.    ED Course/Procedures     Procedures  MDM    87 year old female with complaint of dysuria,  right foot pain.  Vital signs reviewed and are stable. Lower extremities are neurovascular intact bilaterally.  Abdomen soft, nonperitoneal. Serious etiology considered.  Urinalysis demonstrates acute infection.  Urine culture sent.  Start patient on oral antibiotics.  She is not septic.  X-rays reviewed and are stable.  Placed in postop shoe.  Ulceration was discussed with the family, they prefer trial of outpatient therapy at home.  They have home health and physical therapy at home.  Also family members to help care for the patient at home.  This is reasonable given reassuring physical exam and laboratory evaluation.  The patient improved significantly and was discharged in stable condition. Detailed discussions were had with the patient regarding current findings, and need for close f/u with PCP or on call doctor. The patient has been instructed to return immediately if the symptoms worsen in any way for re-evaluation. Patient verbalized understanding and is in agreement with current care plan. All questions answered prior to discharge.        Sloan Leiter, DO 03/28/21 0008

## 2021-03-27 NOTE — ED Provider Notes (Signed)
[facility]-EMERGENCY DEPT Provider Note   CSN: [id number] Arrival date & time: 03/27/21  1139     History Chief Complaint  Patient presents with   Foot Pain    DEVANEY SEGERS is a 87 y.o. female who presents to the emergency department for evaluation of bilateral foot pain, shoulder pain and abdominal pain.  Patient states that her right foot is significantly worse than her left foot and she now is unable to walk.  Family provides additional history stating that she has had severe lower suprapubic abdominal pain and increased frequency.  Denies recent falls and states last fall was greater than 1 year ago.  Denies chest pain, shortness of breath, nausea, vomiting, fever, headache or other systemic symptoms.   Foot Pain Associated symptoms include abdominal pain. Pertinent negatives include no chest pain and no shortness of breath.      Past Medical History:  Diagnosis Date   Acid reflux    Arthritis    Hypertension     Patient Active Problem List   Diagnosis Date Noted   Pelvic pain 11/25/2020   Slow transit constipation 11/25/2020   Atrial fibrillation (HCC) 10/17/2020    Past Surgical History:  Procedure Laterality Date   NO PAST SURGERIES       OB History     Gravida  11   Para      Term      Preterm      AB  1   Living  10      SAB  1   IAB      Ectopic      Multiple      Live Births  10           No family history on file.  Social History   Tobacco Use   Smoking status: Former   Smokeless tobacco: Never  Substance Use Topics   Alcohol use: No   Drug use: No    Home Medications Prior to Admission medications   Medication Sig Start Date End Date Taking? Authorizing Provider  acetaminophen (TYLENOL) 500 MG tablet Take 500 mg by mouth every 6 (six) hours as needed for mild pain, fever or headache.    [provider]  aspirin EC 81 MG tablet Take 81 mg by mouth daily. Swallow whole.    [provider]  aspirin-sod bicarb-citric acid (ALKA-SELTZER) 325 MG TBEF tablet Take 325 mg by mouth every 6 (six) hours as needed (congestion).    [provider]  atorvastatin (LIPITOR) 20 MG tablet Take 20 mg by mouth daily.    [provider]  diclofenac Sodium (VOLTAREN) 1 % GEL Apply 2 g topically 4 (four) times daily. 12/10/20   Gwyneth Sprout, MD  diltiazem (CARDIZEM) 30 MG tablet Take 30 mg by mouth 2 (two) times daily. 01/02/20   [provider]  famotidine (PEPCID) 20 MG tablet Take 20 mg by mouth daily. 08/20/20   [provider]  hydrocortisone (ANUSOL-HC) 2.5 % rectal cream Place 1 application rectally 2 (two) times daily. 11/19/20   Renne Crigler, PA-C  levofloxacin (LEVAQUIN) 250 MG tablet Take 1 tablet (250 mg total) by mouth daily. 11/30/20   Jacalyn Lefevre, MD  lidocaine (LIDODERM) 5 % Place 1 patch onto the skin daily. Remove & Discard patch within 12 hours or as directed by MD 11/30/20   Jacalyn Lefevre, MD  losartan (COZAAR) 50 MG tablet Take 50 mg by mouth daily. 10/26/16  [provider]  metoCLOPramide (REGLAN) 5 MG tablet Take 5 mg by mouth 2 (two) times daily. 10/03/20   [provider]  metoprolol tartrate (LOPRESSOR) 50 MG tablet Take 50 mg by mouth 2 (two) times daily. 05/06/20   [provider]  pramoxine (PROCTOFOAM) 1 % foam Place 1 application rectally 3 (three) times daily as needed for anal itching. 11/25/20   Reva Bores, MD  senna (SENOKOT) 8.6 MG TABS tablet Take 1 tablet (8.6 mg total) by mouth at bedtime as needed for mild constipation. 11/25/20   Reva Bores, MD  traMADol Janean Sark) 50 MG tablet Take 1/2-1 pill po bid prn pain 12/05/20   Cristie Hem, PA-C  VITAMIN D PO Take 1 capsule by mouth daily.    [provider]    Allergies    Patient has no known allergies.  Review of Systems   Review of Systems  Constitutional:  Negative for chills and fever.  HENT:  Negative for ear pain  and sore throat.   Eyes:  Negative for pain and visual disturbance.  Respiratory:  Negative for cough and shortness of breath.   Cardiovascular:  Negative for chest pain and palpitations.  Gastrointestinal:  Positive for abdominal pain. Negative for vomiting.  Genitourinary:  Positive for dysuria. Negative for hematuria.  Musculoskeletal:  Negative for arthralgias and back pain.       Foot pain, shoulder pain  Skin:  Negative for color change and rash.  Neurological:  Negative for seizures and syncope.  All other systems reviewed and are negative.  Physical Exam Updated Vital Signs BP 112/70 (BP Location: Right Arm)   Pulse 67   Temp 97.7 F (36.5 C) (Oral)   Resp 18   SpO2 95%   Physical Exam Vitals and nursing note reviewed.  Constitutional:      General: She is not in acute distress.    Appearance: She is well-developed.  HENT:     Head: Normocephalic and atraumatic.  Eyes:     Conjunctiva/sclera: Conjunctivae normal.  Cardiovascular:     Rate and Rhythm: Normal rate and regular rhythm.     Heart sounds: No murmur heard. Pulmonary:     Effort: Pulmonary effort is normal. No respiratory distress.     Breath sounds: Normal breath sounds.  Abdominal:     Palpations: Abdomen is soft.     Tenderness: There is no abdominal tenderness.  Musculoskeletal:        General: Tenderness (R foot, R shoulder) present.     Cervical back: Neck supple.  Skin:    General: Skin is warm and dry.  Neurological:     Mental Status: She is alert.    ED Results / Procedures / Treatments   Labs (all labs ordered are listed, but only abnormal results are displayed) Labs Reviewed  COMPREHENSIVE METABOLIC PANEL - Abnormal; Notable for the following components:      Result Value   Sodium 134 (*)    Glucose, Bld 116 (*)    BUN 27 (*)    Creatinine, Ser 1.18 (*)    Albumin 3.1 (*)    GFR, Estimated 41 (*)    All other components within normal limits  CBC WITH DIFFERENTIAL/PLATELET -  Abnormal; Notable for the following components:   RBC 5.19 (*)    All other components within normal limits  URINE CULTURE  LIPASE, BLOOD  URINALYSIS, ROUTINE W REFLEX MICROSCOPIC    EKG None  Radiology DG Shoulder Right  Result Date: 03/27/2021 CLINICAL DATA:  Foot and shoulder pain status post fall EXAM: RIGHT SHOULDER - 2+ VIEW; RIGHT FOOT - 2 VIEW; RIGHT ANKLE - COMPLETE 3+ VIEW COMPARISON:  None. FINDINGS: Right foot: Diffuse osteopenia limits evaluation for nondisplaced fracture. Atherosclerotic changes seen throughout visualized arterial segments. Hallux valgus deformity of the great toe. Right ankle: No acute fracture or dislocation. Atherosclerotic changes seen throughout visualized arterial segments. Right shoulder: Severe degenerative changes of the glenohumeral joint. Loss of subacromial space suspicious for chronic rotator cuff tear. Multiple loose body seen around the shoulder joint. IMPRESSION: No acute fracture or dislocation of the right shoulder, foot, or ankle. Evaluation for fracture is limited due to osteopenia. If the patient has continued symptoms, repeat radiographs in 10-14 days. Electronically Signed   By: Acquanetta BellingFarhaan  Mir M.D.   On: 03/27/2021 13:54   DG Ankle Complete Right  Result Date: 03/27/2021 CLINICAL DATA:  Foot and shoulder pain status post fall EXAM: RIGHT SHOULDER - 2+ VIEW; RIGHT FOOT - 2 VIEW; RIGHT ANKLE - COMPLETE 3+ VIEW COMPARISON:  None. FINDINGS: Right foot: Diffuse osteopenia limits evaluation for nondisplaced fracture. Atherosclerotic changes seen throughout visualized arterial segments. Hallux valgus deformity of the great toe. Right ankle: No acute fracture or dislocation. Atherosclerotic changes seen throughout visualized arterial segments. Right shoulder: Severe degenerative changes of the glenohumeral joint. Loss of subacromial space suspicious for chronic rotator cuff tear. Multiple loose body seen around the shoulder joint. IMPRESSION: No acute  fracture or dislocation of the right shoulder, foot, or ankle. Evaluation for fracture is limited due to osteopenia. If the patient has continued symptoms, repeat radiographs in 10-14 days. Electronically Signed   By: Acquanetta BellingFarhaan  Mir M.D.   On: 03/27/2021 13:54   DG Foot 2 Views Right  Result Date: 03/27/2021 CLINICAL DATA:  Foot and shoulder pain status post fall EXAM: RIGHT SHOULDER - 2+ VIEW; RIGHT FOOT - 2 VIEW; RIGHT ANKLE - COMPLETE 3+ VIEW COMPARISON:  None. FINDINGS: Right foot: Diffuse osteopenia limits evaluation for nondisplaced fracture. Atherosclerotic changes seen throughout visualized arterial segments. Hallux valgus deformity of the great toe. Right ankle: No acute fracture or dislocation. Atherosclerotic changes seen throughout visualized arterial segments. Right shoulder: Severe degenerative changes of the glenohumeral joint. Loss of subacromial space suspicious for chronic rotator cuff tear. Multiple loose body seen around the shoulder joint. IMPRESSION: No acute fracture or dislocation of the right shoulder, foot, or ankle. Evaluation for fracture is limited due to osteopenia. If the patient has continued symptoms, repeat radiographs in 10-14 days. Electronically Signed   By: Acquanetta BellingFarhaan  Mir M.D.   On: 03/27/2021 13:54    Procedures Procedures   Medications Ordered in ED Medications - No data to display  ED Course  I have reviewed the triage vital signs and the nursing notes.  Pertinent labs & imaging results that were available during my care of the patient were reviewed by me and considered in my medical decision making (see chart for details).    MDM Rules/Calculators/A&P                           Patient seen the emergency department for evaluation of foot and shoulder pain, abdominal pain.  Physical exam does not reveal significant pain but patient is very tender in the right ankle.  X-ray imaging unremarkable of bilateral feet, ankles and right shoulder.  Laboratory  evaluation largely unremarkable.  Urinalysis currently pending.  Patient signed out to Dr. Wallace CullensGray.  Please see Dr. Gwenette Greet signout note for continuation of work-up.  Patient will be discharged ultimately in the care of their family members to have home health and physical therapy to take care of the patient at home. Final Clinical Impression(s) / ED Diagnoses Final diagnoses:  None    Rx / DC Orders ED Discharge Orders     None        Zyanne Schumm, Wyn Forster, MD 03/27/21 [phone]

## 2021-03-27 NOTE — ED Triage Notes (Signed)
Ems brings pt in from home for bilateral foot pain. States this pain is not new but reports pt has less mobility today. Family wanted pt evaluated.

## 2021-03-28 LAB — URINE CULTURE: Culture: NO GROWTH

## 2021-04-07 ENCOUNTER — Other Ambulatory Visit: Payer: Self-pay

## 2021-04-07 ENCOUNTER — Ambulatory Visit (INDEPENDENT_AMBULATORY_CARE_PROVIDER_SITE_OTHER): Payer: Medicare Other | Admitting: Podiatry

## 2021-04-07 DIAGNOSIS — M79676 Pain in unspecified toe(s): Secondary | ICD-10-CM | POA: Diagnosis not present

## 2021-04-07 DIAGNOSIS — L84 Corns and callosities: Secondary | ICD-10-CM

## 2021-04-07 DIAGNOSIS — B351 Tinea unguium: Secondary | ICD-10-CM | POA: Diagnosis not present

## 2021-04-07 NOTE — Progress Notes (Signed)
This patient presents to the office with chief complaint of long thick painful nails.  Patient says the nails are painful walking and wearing shoes.  This patient is unable to self treat.  This patient is unable to trim her nails since she is unable to reach her nails. She is also concerned about her heel callus. She presents to the office for preventative foot care services.   She presents to the office with her daughter..  General Appearance  Alert, conversant and in no acute stress.  Vascular  Dorsalis pedis and posterior tibial  pulses are weakly  palpable  bilaterally.  Capillary return is within normal limits  bilaterally. Temperature is within normal limits  bilaterally.  Neurologic  Senn-Weinstein monofilament wire test within normal limits  bilaterally. Muscle power within normal limits bilaterally.  Nails Thick disfigured discolored nails with subungual debris  from hallux to fifth toes bilaterally. No evidence of bacterial infection or drainage bilaterally.  Orthopedic  No limitations of motion  feet .  No crepitus or effusions noted.  No bony pathology or digital deformities noted.  HAV  B/L.  Hammer toes  B/L.  Skin  normotropic skin with no porokeratosis noted bilaterally.  No signs of infections or ulcers noted.   Callus sub 5th met  B/L.  Onychomycosis  Nails  B/L.  Pain in right toes  Pain in left toes  Callus  B/L  Debridement of nails both feet followed trimming the nails with dremel tool.  Debride callus with  # 15 blade and dremel tool.   RTC  10 weeks    Gardiner Barefoot DPM

## 2021-05-02 ENCOUNTER — Encounter: Payer: Self-pay | Admitting: Physician Assistant

## 2021-05-15 ENCOUNTER — Ambulatory Visit (INDEPENDENT_AMBULATORY_CARE_PROVIDER_SITE_OTHER): Payer: Medicare Other | Admitting: Physician Assistant

## 2021-05-15 ENCOUNTER — Encounter: Payer: Self-pay | Admitting: Physician Assistant

## 2021-05-15 VITALS — BP 118/64 | HR 84 | Ht 61.0 in | Wt 102.0 lb

## 2021-05-15 DIAGNOSIS — B370 Candidal stomatitis: Secondary | ICD-10-CM

## 2021-05-15 DIAGNOSIS — R1310 Dysphagia, unspecified: Secondary | ICD-10-CM | POA: Diagnosis not present

## 2021-05-15 MED ORDER — NYSTATIN 100000 UNIT/ML MT SUSP: OROMUCOSAL | 0 refills | Status: DC

## 2021-05-15 MED ORDER — OMEPRAZOLE 40 MG PO CPDR: 40.0000 mg | DELAYED_RELEASE_CAPSULE | Freq: Every day | ORAL | 5 refills | Status: DC

## 2021-05-15 NOTE — Patient Instructions (Addendum)
If you are age 85 or older, your body mass index should be between 23-30. Your Body mass index is 19.27 kg/m. If this is out of the aforementioned range listed, please consider follow up with your Primary Care Provider.  If you are age 53 or younger, your body mass index should be between 19-25. Your Body mass index is 19.27 kg/m. If this is out of the aformentioned range listed, please consider follow up with your Primary Care Provider.    You have been scheduled for a Barium Esophogram at [facility] on 05-21-2021 at 930am. Please arrive 15 minutes prior to your appointment for registration. Make certain not to have anything to eat or drink 3 hours prior to your test. If you need to reschedule for any reason, please contact radiology at [phone] to do so. __________________________________________________________________ A barium swallow is an examination that concentrates on views of the esophagus. This tends to be a double contrast exam (barium and two liquids which, when combined, create a gas to distend the wall of the oesophagus) or single contrast (non-ionic iodine based). The study is usually tailored to your symptoms so a good history is essential. Attention is paid during the study to the form, structure and configuration of the esophagus, looking for functional disorders (such as aspiration, dysphagia, achalasia, motility and reflux) EXAMINATION You may be asked to change into a gown, depending on the type of swallow being performed. A radiologist and radiographer will perform the procedure. The radiologist will advise you of the type of contrast selected for your procedure and direct you during the exam. You will be asked to stand, sit or lie in several different positions and to hold a small amount of fluid in your mouth before being asked to swallow while the imaging is performed .In some instances you may be asked to swallow barium coated marshmallows to assess the motility  of a solid food bolus. The exam can be recorded as a digital or video fluoroscopy procedure. POST PROCEDURE It will take 1-2 days for the barium to pass through your system. To facilitate this, it is important, unless otherwise directed, to increase your fluids for the next 24-48hrs and to resume your normal diet.  This test typically takes about 30 minutes to perform. __________________________________________________________________________________   ________________________________________________________  The [facility] providers would like to encourage you to use [facility] to communicate with providers for non-urgent requests or questions.  Due to long hold times on the telephone, sending your provider a message by [facility] may be a faster and more efficient way to get a response.  Please allow 48 business hours for a response.  Please remember that this is for non-urgent requests.  _______________________________________________________  We have sent the following medications to your pharmacy for you to pick up at your convenience: Omeprazole and nystatin   Thank you for choosing me and  Gastroenterology.Hyacinth Meeker, PA-C

## 2021-05-15 NOTE — Progress Notes (Signed)
Chief Complaint: Dysphagia  HPI:    Sherry [name] is a 87 year old female with a past medical history of A. fib on aspirin, reflux, chronic constipation and others listed below, who was referred to me by Orpah Cobb, MD for a complaint of dysphagia.    11/07/2020 CT the abdomen pelvis with contrast with gas and fluid mildly distending the uterine cavity up to 12 mm thickness, marked diffuse colonic diverticulosis with no evidence of acute diverticulitis.  Mild patchy subpleural reticulation groundglass opacity both lung bases.  Aortic atherosclerosis.    01/17/2021 patient seen by her PCP and at that time was doing fairly well other than some arthritis and a pressure ulcer.    03/27/2021 normal CBC.  CMP with a sodium minimally decreased at 134, creatinine elevated 1.18, albumin low at 3.1.  Lipase normal.    Today, the patient presents to clinic accompanied by her son.  History is also garnered through her daughter on the phone.  Together they all explain the patient has been complaining of a "bump" near her throat.  She touches the end of her clavicle.  Also complains that when she eats food most times even though it is ground it seems to get hung up in her throat and then will gradually go down if she waits a while.  This also happens with liquids.  It has been occurring over the past year and seems to be getting worse recently.  She denies any overt heartburn or reflux symptoms.  Denies abdominal pain.    Denies fever, chills, nausea, vomiting, weight loss, blood in her stool, change in bowel habits or symptoms that awaken her from sleep.  Past Medical History:  Diagnosis Date   Acid reflux    Arthritis    Atrial fibrillation (HCC)    Chronic constipation    GERD (gastroesophageal reflux disease)    Hyperlipidemia    Hypertension    Osteoarthritis    Pressure ulcer of sacral region     Past Surgical History:  Procedure Laterality Date   NO PAST SURGERIES      Current Outpatient  Medications  Medication Sig Dispense Refill   acetaminophen (TYLENOL) 500 MG tablet Take 500 mg by mouth every 6 (six) hours as needed for mild pain, fever or headache.     aspirin EC 81 MG tablet Take 81 mg by mouth daily. Swallow whole.     aspirin-sod bicarb-citric acid (ALKA-SELTZER) 325 MG TBEF tablet Take 325 mg by mouth every 6 (six) hours as needed (congestion).     atorvastatin (LIPITOR) 20 MG tablet Take 20 mg by mouth daily.     diclofenac Sodium (VOLTAREN) 1 % GEL Apply 2 g topically 4 (four) times daily. 100 g 1   diltiazem (CARDIZEM) 30 MG tablet Take 30 mg by mouth 2 (two) times daily.     famotidine (PEPCID) 20 MG tablet Take 20 mg by mouth daily.     hydrocortisone (ANUSOL-HC) 2.5 % rectal cream Place 1 application rectally 2 (two) times daily. 30 g 0   levofloxacin (LEVAQUIN) 250 MG tablet Take 1 tablet (250 mg total) by mouth daily. 7 tablet 0   lidocaine (LIDODERM) 5 % Place 1 patch onto the skin daily. Remove & Discard patch within 12 hours or as directed by MD 30 patch 0   losartan (COZAAR) 50 MG tablet Take 50 mg by mouth daily.  0   metoCLOPramide (REGLAN) 5 MG tablet Take 5 mg by mouth 2 (two) times daily.  metoprolol tartrate (LOPRESSOR) 50 MG tablet Take 50 mg by mouth 2 (two) times daily.     pramoxine (PROCTOFOAM) 1 % foam Place 1 application rectally 3 (three) times daily as needed for anal itching. 15 g 0   senna (SENOKOT) 8.6 MG TABS tablet Take 1 tablet (8.6 mg total) by mouth at bedtime as needed for mild constipation. 120 tablet 0   traMADol (ULTRAM) 50 MG tablet Take 1/2-1 pill po bid prn pain 30 tablet 2   VITAMIN D PO Take 1 capsule by mouth daily.     No current facility-administered medications for this visit.    Allergies as of 05/15/2021   (No Known Allergies)    Family History  Problem Relation Age of Onset   Hypertension Mother    Other Brother        malignant neoplastic disease    Social History   Socioeconomic History   Marital  status: Married    Spouse name: Not on file   Number of children: Not on file   Years of education: Not on file   Highest education level: Not on file  Occupational History   Not on file  Tobacco Use   Smoking status: Former   Smokeless tobacco: Never  Substance and Sexual Activity   Alcohol use: No   Drug use: No   Sexual activity: Not Currently    Birth control/protection: None  Other Topics Concern   Not on file  Social History Narrative   Not on file   Social Determinants of Health   Financial Resource Strain: Not on file  Food Insecurity: No Food Insecurity   Worried About Running Out of Food in the Last Year: Never true   Ran Out of Food in the Last Year: Never true  Transportation Needs: No Transportation Needs   Lack of Transportation (Medical): No   Lack of Transportation (Non-Medical): No  Physical Activity: Not on file  Stress: Not on file  Social Connections: Not on file  Intimate Partner Violence: Not on file    Review of Systems:    Constitutional: No weight loss, fever or chills Skin: No rash  Cardiovascular: No chest pain Respiratory: No SOB Gastrointestinal: See HPI and otherwise negative Genitourinary: No dysuria  Neurological: No headache, dizziness or syncope Musculoskeletal: No new muscle or joint pain Hematologic: No bleeding Psychiatric: No history of depression or anxiety   Physical Exam:  Vital signs: BP 118/64   Pulse 84   Ht 5\' 1"  (1.549 m)   Wt 102 lb (46.3 kg)   SpO2 97%   BMI 19.27 kg/m    Constitutional:   Pleasant elderly, frail-appearing, African-American female appears to be in NAD, Well developed, Well nourished, alert and cooperative Head:  Normocephalic and atraumatic. Eyes:   PEERL, EOMI. No icterus. Conjunctiva pink. Ears:  Normal auditory acuity. Neck:  Supple Throat: Oral cavity and pharynx without inflammation, swelling or lesion.  Prominent clavicle, tongue with white residue, edentulous Respiratory:  Respirations even and unlabored. Lungs clear to auscultation bilaterally.   No wheezes, crackles, or rhonchi.  Cardiovascular: Normal S1, S2. No MRG. Regular rate and rhythm. No peripheral edema, cyanosis or pallor.  Gastrointestinal:  Soft, nondistended, nontender. No rebound or guarding. Normal bowel sounds. No appreciable masses or hepatomegaly. Rectal:  Not performed.  Msk:  Symmetrical without gross deformities. Without edema, no deformity or joint abnormality.  Neurologic:  Alert and  oriented x4;  grossly normal neurologically.  Skin:   Dry and intact without  significant lesions or rashes. Psychiatric: Demonstrates good judgement and reason without abnormal affect or behaviors.  RELEVANT LABS AND IMAGING: CBC    Component Value Date/Time   WBC 5.4 03/27/2021 1421   RBC 5.19 (H) 03/27/2021 1421   HGB 13.8 03/27/2021 1421   HCT 44.4 03/27/2021 1421   PLT 267 03/27/2021 1421   MCV 85.5 03/27/2021 1421   MCH 26.6 03/27/2021 1421   MCHC 31.1 03/27/2021 1421   RDW 14.3 03/27/2021 1421   LYMPHSABS 0.9 03/27/2021 1421   MONOABS 0.6 03/27/2021 1421   EOSABS 0.0 03/27/2021 1421   BASOSABS 0.0 03/27/2021 1421    CMP     Component Value Date/Time   NA 134 (L) 03/27/2021 1421   K 4.3 03/27/2021 1421   CL 102 03/27/2021 1421   CO2 24 03/27/2021 1421   GLUCOSE 116 (H) 03/27/2021 1421   BUN 27 (H) 03/27/2021 1421   CREATININE 1.18 (H) 03/27/2021 1421   CALCIUM 9.4 03/27/2021 1421   PROT 7.2 03/27/2021 1421   ALBUMIN 3.1 (L) 03/27/2021 1421   AST 32 03/27/2021 1421   ALT 12 03/27/2021 1421   ALKPHOS 114 03/27/2021 1421   BILITOT 0.9 03/27/2021 1421   GFRNONAA 41 (L) 03/27/2021 1421   GFRAA 43 (L) 08/07/2017 1519    Assessment: 1.  Dysphagia: Worsening symptoms over the past year, no prior GI evaluation, with liquids and solids 2.  "Bump" near throat: Discussed that this is just the end of her clavicle and it is fairly prominent 3.  Oral candidiasis: Seen at time of exam  today, question whether this also has turned into esophageal candidiasis adding to the above  Plan: 1.  Discussed with patient that there are a few different reasons that she could be having trouble swallowing.  We we will do a barium esophagram with tablet to try and distinguish further to make sure she does not have an organic cause like a stricture.  We will try to avoid EGD as patient is 87 years old at increased risk for any anesthesia. 2.  Also started the patient on Omeprazole 40 mg once daily, 30 to 60 minutes before breakfast #30 to cover for any reflux related symptoms. 3.  Also prescribed Nystatin swish and swallow 4 to 6 mL 4 times daily for the next 14 days for signs of oral candidiasis and possibility of esophageal candidiasis which could also be adding to problems 4.  Continue ground foods.  Reviewed anti-dysphagia measures. 5.  Patient follow in clinic with me in 1 to 2 months or sooner as indicated by imaging. 6.  Patient assigned to Dr. Lavon Paganini this morning.  Hyacinth Meeker, PA-C [facility] 05/15/2021, 11:45 AM  Cc: Orpah Cobb, MD

## 2021-05-21 ENCOUNTER — Other Ambulatory Visit: Payer: Self-pay

## 2021-05-21 ENCOUNTER — Ambulatory Visit (HOSPITAL_COMMUNITY)
Admission: RE | Admit: 2021-05-21 | Discharge: 2021-05-21 | Disposition: A | Discharge status: Home / Self Care | Payer: Medicare Other | Source: Ambulatory Visit | Attending: Physician Assistant | Admitting: Physician Assistant

## 2021-05-21 DIAGNOSIS — B370 Candidal stomatitis: Secondary | ICD-10-CM

## 2021-05-21 DIAGNOSIS — R1310 Dysphagia, unspecified: Secondary | ICD-10-CM

## 2021-05-21 DIAGNOSIS — I63412 Cerebral infarction due to embolism of left middle cerebral artery: Secondary | ICD-10-CM | POA: Diagnosis not present

## 2021-05-21 DIAGNOSIS — I639 Cerebral infarction, unspecified: Secondary | ICD-10-CM | POA: Diagnosis not present

## 2021-05-24 ENCOUNTER — Other Ambulatory Visit: Payer: Self-pay

## 2021-05-24 ENCOUNTER — Inpatient Hospital Stay (HOSPITAL_COMMUNITY): Payer: Medicare Other

## 2021-05-24 ENCOUNTER — Emergency Department (HOSPITAL_COMMUNITY): Payer: Medicare Other

## 2021-05-24 ENCOUNTER — Inpatient Hospital Stay (HOSPITAL_COMMUNITY)
Admission: EM | Admit: 2021-05-24 | Discharge: 2021-05-28 | DRG: 062 | Disposition: A | Discharge status: Rehabilitation Facility | Payer: Medicare Other | Attending: Neurology | Admitting: Neurology

## 2021-05-24 ENCOUNTER — Encounter (HOSPITAL_COMMUNITY): Payer: Self-pay

## 2021-05-24 ENCOUNTER — Other Ambulatory Visit (HOSPITAL_COMMUNITY): Payer: Medicare Other

## 2021-05-24 DIAGNOSIS — I6523 Occlusion and stenosis of bilateral carotid arteries: Secondary | ICD-10-CM | POA: Diagnosis present

## 2021-05-24 DIAGNOSIS — Z79899 Other long term (current) drug therapy: Secondary | ICD-10-CM

## 2021-05-24 DIAGNOSIS — Z87891 Personal history of nicotine dependence: Secondary | ICD-10-CM | POA: Diagnosis not present

## 2021-05-24 DIAGNOSIS — R4701 Aphasia: Secondary | ICD-10-CM | POA: Diagnosis present

## 2021-05-24 DIAGNOSIS — G8191 Hemiplegia, unspecified affecting right dominant side: Secondary | ICD-10-CM | POA: Diagnosis present

## 2021-05-24 DIAGNOSIS — Z20822 Contact with and (suspected) exposure to covid-19: Secondary | ICD-10-CM | POA: Diagnosis present

## 2021-05-24 DIAGNOSIS — R471 Dysarthria and anarthria: Secondary | ICD-10-CM | POA: Diagnosis present

## 2021-05-24 DIAGNOSIS — I482 Chronic atrial fibrillation, unspecified: Secondary | ICD-10-CM | POA: Diagnosis present

## 2021-05-24 DIAGNOSIS — I119 Hypertensive heart disease without heart failure: Secondary | ICD-10-CM | POA: Diagnosis not present

## 2021-05-24 DIAGNOSIS — Z8249 Family history of ischemic heart disease and other diseases of the circulatory system: Secondary | ICD-10-CM

## 2021-05-24 DIAGNOSIS — R29722 NIHSS score 22: Secondary | ICD-10-CM | POA: Diagnosis present

## 2021-05-24 DIAGNOSIS — I4892 Unspecified atrial flutter: Secondary | ICD-10-CM | POA: Diagnosis present

## 2021-05-24 DIAGNOSIS — F419 Anxiety disorder, unspecified: Secondary | ICD-10-CM | POA: Diagnosis not present

## 2021-05-24 DIAGNOSIS — J029 Acute pharyngitis, unspecified: Secondary | ICD-10-CM | POA: Diagnosis not present

## 2021-05-24 DIAGNOSIS — Z7982 Long term (current) use of aspirin: Secondary | ICD-10-CM

## 2021-05-24 DIAGNOSIS — K5901 Slow transit constipation: Secondary | ICD-10-CM | POA: Diagnosis not present

## 2021-05-24 DIAGNOSIS — N179 Acute kidney failure, unspecified: Secondary | ICD-10-CM | POA: Diagnosis not present

## 2021-05-24 DIAGNOSIS — I4821 Permanent atrial fibrillation: Secondary | ICD-10-CM | POA: Diagnosis not present

## 2021-05-24 DIAGNOSIS — M1990 Unspecified osteoarthritis, unspecified site: Secondary | ICD-10-CM | POA: Diagnosis present

## 2021-05-24 DIAGNOSIS — I63412 Cerebral infarction due to embolism of left middle cerebral artery: Secondary | ICD-10-CM | POA: Diagnosis present

## 2021-05-24 DIAGNOSIS — R29810 Facial weakness: Secondary | ICD-10-CM | POA: Diagnosis present

## 2021-05-24 DIAGNOSIS — I6300 Cerebral infarction due to thrombosis of unspecified precerebral artery: Secondary | ICD-10-CM | POA: Diagnosis not present

## 2021-05-24 DIAGNOSIS — R0603 Acute respiratory distress: Secondary | ICD-10-CM | POA: Diagnosis present

## 2021-05-24 DIAGNOSIS — E785 Hyperlipidemia, unspecified: Secondary | ICD-10-CM | POA: Diagnosis present

## 2021-05-24 DIAGNOSIS — I10 Essential (primary) hypertension: Secondary | ICD-10-CM | POA: Diagnosis present

## 2021-05-24 DIAGNOSIS — Z66 Do not resuscitate: Secondary | ICD-10-CM | POA: Diagnosis present

## 2021-05-24 DIAGNOSIS — I639 Cerebral infarction, unspecified: Secondary | ICD-10-CM | POA: Diagnosis present

## 2021-05-24 DIAGNOSIS — R1319 Other dysphagia: Secondary | ICD-10-CM | POA: Diagnosis not present

## 2021-05-24 DIAGNOSIS — L89891 Pressure ulcer of other site, stage 1: Secondary | ICD-10-CM | POA: Diagnosis not present

## 2021-05-24 DIAGNOSIS — E876 Hypokalemia: Secondary | ICD-10-CM | POA: Diagnosis not present

## 2021-05-24 DIAGNOSIS — I69351 Hemiplegia and hemiparesis following cerebral infarction affecting right dominant side: Secondary | ICD-10-CM | POA: Diagnosis not present

## 2021-05-24 DIAGNOSIS — K219 Gastro-esophageal reflux disease without esophagitis: Secondary | ICD-10-CM | POA: Diagnosis present

## 2021-05-24 DIAGNOSIS — I63512 Cerebral infarction due to unspecified occlusion or stenosis of left middle cerebral artery: Secondary | ICD-10-CM | POA: Diagnosis not present

## 2021-05-24 DIAGNOSIS — I69320 Aphasia following cerebral infarction: Secondary | ICD-10-CM | POA: Diagnosis not present

## 2021-05-24 DIAGNOSIS — L8990 Pressure ulcer of unspecified site, unspecified stage: Secondary | ICD-10-CM | POA: Insufficient documentation

## 2021-05-24 DIAGNOSIS — E871 Hypo-osmolality and hyponatremia: Secondary | ICD-10-CM | POA: Diagnosis not present

## 2021-05-24 DIAGNOSIS — I63312 Cerebral infarction due to thrombosis of left middle cerebral artery: Secondary | ICD-10-CM | POA: Diagnosis not present

## 2021-05-24 LAB — CBG MONITORING, ED: Glucose-Capillary: 104 mg/dL — ABNORMAL HIGH (ref 70–99)

## 2021-05-24 LAB — I-STAT CHEM 8, ED
BUN: 18 mg/dL (ref 8–23)
Calcium, Ion: 1.09 mmol/L — ABNORMAL LOW (ref 1.15–1.40)
Chloride: 104 mmol/L (ref 98–111)
Creatinine, Ser: 1.2 mg/dL — ABNORMAL HIGH (ref 0.44–1.00)
Glucose, Bld: 88 mg/dL (ref 70–99)
HCT: 43 % (ref 36.0–46.0)
Hemoglobin: 14.6 g/dL (ref 12.0–15.0)
Potassium: 3.8 mmol/L (ref 3.5–5.1)
Sodium: 136 mmol/L (ref 135–145)
TCO2: 23 mmol/L (ref 22–32)

## 2021-05-24 LAB — DIFFERENTIAL
Abs Immature Granulocytes: 0.03 10*3/uL (ref 0.00–0.07)
Basophils Absolute: 0 10*3/uL (ref 0.0–0.1)
Basophils Relative: 1 %
Eosinophils Absolute: 0.1 10*3/uL (ref 0.0–0.5)
Eosinophils Relative: 2 %
Immature Granulocytes: 1 %
Lymphocytes Relative: 23 %
Lymphs Abs: 1 10*3/uL (ref 0.7–4.0)
Monocytes Absolute: 0.5 10*3/uL (ref 0.1–1.0)
Monocytes Relative: 11 %
Neutro Abs: 2.7 10*3/uL (ref 1.7–7.7)
Neutrophils Relative %: 62 %

## 2021-05-24 LAB — CBC
HCT: 42.7 % (ref 36.0–46.0)
Hemoglobin: 13.2 g/dL (ref 12.0–15.0)
MCH: 26.9 pg (ref 26.0–34.0)
MCHC: 30.9 g/dL (ref 30.0–36.0)
MCV: 87.1 fL (ref 80.0–100.0)
Platelets: 209 10*3/uL (ref 150–400)
RBC: 4.9 MIL/uL (ref 3.87–5.11)
RDW: 16 % — ABNORMAL HIGH (ref 11.5–15.5)
WBC: 4.4 10*3/uL (ref 4.0–10.5)
nRBC: 0 % (ref 0.0–0.2)

## 2021-05-24 LAB — COMPREHENSIVE METABOLIC PANEL
ALT: 11 U/L (ref 0–44)
AST: 28 U/L (ref 15–41)
Albumin: 3.2 g/dL — ABNORMAL LOW (ref 3.5–5.0)
Alkaline Phosphatase: 108 U/L (ref 38–126)
Anion gap: 11 (ref 5–15)
BUN: 16 mg/dL (ref 8–23)
CO2: 21 mmol/L — ABNORMAL LOW (ref 22–32)
Calcium: 9.7 mg/dL (ref 8.9–10.3)
Chloride: 103 mmol/L (ref 98–111)
Creatinine, Ser: 1.21 mg/dL — ABNORMAL HIGH (ref 0.44–1.00)
GFR, Estimated: 40 mL/min — ABNORMAL LOW (ref >60)
Glucose, Bld: 88 mg/dL (ref 70–99)
Potassium: 4.1 mmol/L (ref 3.5–5.1)
Sodium: 135 mmol/L (ref 135–145)
Total Bilirubin: 1.2 mg/dL (ref 0.3–1.2)
Total Protein: 6.7 g/dL (ref 6.5–8.1)

## 2021-05-24 LAB — PROTIME-INR
INR: 1.1 (ref 0.8–1.2)
Prothrombin Time: 14 seconds (ref 11.4–15.2)

## 2021-05-24 LAB — MRSA NEXT GEN BY PCR, NASAL: MRSA by PCR Next Gen: NOT DETECTED

## 2021-05-24 LAB — RESP PANEL BY RT-PCR (FLU A&B, COVID) ARPGX2
Influenza A by PCR: NEGATIVE
Influenza B by PCR: NEGATIVE
SARS Coronavirus 2 by RT PCR: NEGATIVE

## 2021-05-24 LAB — APTT: aPTT: 27 seconds (ref 24–36)

## 2021-05-24 MED ORDER — NICARDIPINE HCL IN NACL 20-0.86 MG/200ML-% IV SOLN
0.0000 mg/h | INTRAVENOUS | Status: DC | PRN
  Filled 2021-05-24: qty 200

## 2021-05-24 MED ORDER — STROKE: EARLY STAGES OF RECOVERY BOOK
Freq: Once | Status: AC
  Filled 2021-05-24: qty 1

## 2021-05-24 MED ORDER — ACETAMINOPHEN 325 MG PO TABS
650.0000 mg | ORAL_TABLET | ORAL | Status: DC | PRN
  Administered 2021-05-26 – 2021-05-28 (×3): 650 mg via ORAL
  Filled 2021-05-24 (×3): qty 2

## 2021-05-24 MED ORDER — LABETALOL HCL 5 MG/ML IV SOLN: 10.0000 mg | Freq: Once | INTRAVENOUS | Status: DC | PRN

## 2021-05-24 MED ORDER — TENECTEPLASE FOR STROKE
0.2500 mg/kg | PACK | Freq: Once | INTRAVENOUS | Status: AC
  Administered 2021-05-24: 12 mg via INTRAVENOUS
  Filled 2021-05-24: qty 10

## 2021-05-24 MED ORDER — CHLORHEXIDINE GLUCONATE CLOTH 2 % EX PADS
6.0000 | MEDICATED_PAD | Freq: Every day | CUTANEOUS | Status: DC
  Administered 2021-05-24 – 2021-05-26 (×3): 6 via TOPICAL

## 2021-05-24 MED ORDER — ORAL CARE MOUTH RINSE
15.0000 mL | Freq: Two times a day (BID) | OROMUCOSAL | Status: DC
  Administered 2021-05-24 – 2021-05-28 (×7): 15 mL via OROMUCOSAL

## 2021-05-24 MED ORDER — LABETALOL HCL 5 MG/ML IV SOLN: 10.0000 mg | INTRAVENOUS | Status: DC | PRN

## 2021-05-24 MED ORDER — CHLORHEXIDINE GLUCONATE 0.12 % MT SOLN
15.0000 mL | Freq: Two times a day (BID) | OROMUCOSAL | Status: DC
  Administered 2021-05-24 – 2021-05-28 (×9): 15 mL via OROMUCOSAL
  Filled 2021-05-24 (×5): qty 15

## 2021-05-24 MED ORDER — SODIUM CHLORIDE 0.9% FLUSH: 3.0000 mL | Freq: Once | INTRAVENOUS | Status: DC

## 2021-05-24 MED ORDER — IOHEXOL 350 MG/ML SOLN
75.0000 mL | Freq: Once | INTRAVENOUS | Status: AC | PRN
  Administered 2021-05-24: 75 mL via INTRAVENOUS

## 2021-05-24 MED ORDER — PANTOPRAZOLE SODIUM 40 MG IV SOLR
40.0000 mg | Freq: Every day | INTRAVENOUS | Status: DC
  Administered 2021-05-24 – 2021-05-25 (×2): 40 mg via INTRAVENOUS
  Filled 2021-05-24 (×2): qty 40

## 2021-05-24 MED ORDER — ACETAMINOPHEN 650 MG RE SUPP: 650.0000 mg | RECTAL | Status: DC | PRN

## 2021-05-24 MED ORDER — ACETAMINOPHEN 160 MG/5ML PO SOLN: 650.0000 mg | ORAL | Status: DC | PRN

## 2021-05-24 MED ORDER — SODIUM CHLORIDE 0.9 % IV SOLN
50.0000 mL/h | INTRAVENOUS | Status: DC
  Administered 2021-05-24 – 2021-05-27 (×6): 50 mL/h via INTRAVENOUS

## 2021-05-24 NOTE — H&P (Signed)
Neurology H&P  CC: Right-sided weakness  History is obtained from: Daughter  HPI: Sherry [name] is a 87 y.o. female with a history of hypertension, hyperlipidemia, atrial fibrillation not on anticoagulation who presents with right-sided weakness that started this morning.  She called out for her daughter to help her get her to the bathroom and when her daughter responded she was in her normal state of health, speaking clearly, and stood up and got her self to her bedside commode.  She was doing her business, and then the daughter checked on her and found her to be slumped towards the right.  911 was called.  She was brought in as a code stroke and she was taken for a emergent CT/CTA which were negative for contraindication to TNK.  I discussed with her daughter [name], who is her power of attorney by phone and advised her of the risks and benefits of IV thrombolytic therapy.  She agreed to proceed.  I asked her about whether she would want heroic life-saving measures and her daughter indicates that she would not want a breathing tube or chest compressions.   LKW: 5:15 AM tnk given?:  Yes IR Thrombectomy? No, no LVO Modified Rankin Scale: 3-Moderate disability-requires help but walks WITHOUT assistance NIHSS: 22   ROS:  Unable to obtain due to altered mental status.   Past Medical History:  Diagnosis Date   Acid reflux    Arthritis    Atrial fibrillation (HCC)    Chronic constipation    GERD (gastroesophageal reflux disease)    Hyperlipidemia    Hypertension    Osteoarthritis    Pressure ulcer of sacral region      Family History  Problem Relation Age of Onset   Hypertension Mother    Other Brother        malignant neoplastic disease   Colon cancer Neg Hx    Stomach cancer Neg Hx    Esophageal cancer Neg Hx    Pancreatic cancer Neg Hx      Social History:  reports that she has quit smoking. She has never used smokeless tobacco. She reports that she does not drink alcohol  and does not use drugs.   Prior to Admission medications   Medication Sig Start Date End Date Taking? Authorizing Provider  acetaminophen (TYLENOL) 500 MG tablet Take 500 mg by mouth every 6 (six) hours as needed for mild pain, fever or headache.    [provider]  aspirin EC 81 MG tablet Take 81 mg by mouth daily. Swallow whole.    [provider]  diltiazem (CARDIZEM) 30 MG tablet Take 30 mg by mouth 2 (two) times daily. 01/02/20   [provider]  metoCLOPramide (REGLAN) 5 MG tablet Take 5 mg by mouth 2 (two) times daily. 10/03/20   [provider]  metoprolol tartrate (LOPRESSOR) 50 MG tablet Take 50 mg by mouth 2 (two) times daily. 05/06/20   [provider]  nystatin (MYCOSTATIN) 100000 UNIT/ML suspension Use 4-6 mL swish and swallow 4 times daily for 14 days 05/15/21   Levin Erp, PA  omeprazole (PRILOSEC) 40 MG capsule Take 1 capsule (40 mg total) by mouth daily. 05/15/21   Levin Erp, PA  traMADol (ULTRAM) 50 MG tablet Take 1/2-1 pill po bid prn pain 12/05/20   Aundra Dubin, PA-C     Exam: Current vital signs: BP (!) 156/72   Pulse 76   Temp 97.7 F (36.5 C) (Oral)   Resp  18   SpO2 99%    Physical Exam  Constitutional: Appears elderly.  Psych: Affect appropriate to situation Eyes: No scleral injection HENT: No OP obstrucion Head: Normocephalic.  Cardiovascular: Normal rate and regular rhythm.  Respiratory: Effort normal and breath sounds normal to anterior ascultation GI: Soft.  No distension. There is no tenderness.  Skin: WDI  Neuro: Mental Status: Patient is awake, alert, she is densely aphasic, she does have some verbal output but it is nonsensical sounds.  She is dysarthric.  She does not follow commands. Cranial Nerves: II: She does not blink to threat from the right. Pupils are equal, round, and reactive to light.   III,IV, VI: Eyes are slightly disconjugate, she appears to have a mild  left gaze preference but does cross midline to the right. VII: Facial movement with right facial weakness VIII: hearing is intact to voice Motor: She has a severe right hemiparesis with little movement of the arm or leg, 1-2/5 in both she does move her left leg, but does not lift it against gravity.  She does lift her left arm against gravity. sensory: She does not respond to pinch on the right Cerebellar: She does not perform   I have reviewed labs in epic and the pertinent results are: Cbg 104  I have reviewed the images obtained:CT/CTA - no acute findings  Primary Diagnosis:  Cerebral infarction due to embolism of  left middle cerebral artery.   Secondary Diagnosis: Essential (primary) hypertension and Paroxysmal atrial fibrillation   Impression: 87 yo F with right sided weakness and aphasia most consistent with acute ischemic stroke. She does not have a clear LVO, but was within the time window for TNK. I discussed this with the daughter([name]) who agreed with proceeding.   Plan: - HgbA1c, fasting lipid panel - MRI of the brain without contrast - Frequent neuro checks - Echocardiogram - CTA head and neck - Prophylactic therapy-none for 24 hours.  - Risk factor modification - Telemetry monitoring - PT consult, OT consult, Speech consult - Stroke team to follow  This patient is critically ill and at significant risk of neurological worsening, death and care requires constant monitoring of vital signs, hemodynamics,respiratory and cardiac monitoring, neurological assessment, discussion with family, other specialists and medical decision making of high complexity. I spent 65 minutes of neurocritical care time  in the care of  this patient. This was time spent independent of any time provided by nurse practitioner or PA.  Ritta Slot, MD [facility] [phone]  If 7pm- 7am, please page neurology on call as listed in AMION.

## 2021-05-24 NOTE — ED Provider Notes (Signed)
[facility] EMERGENCY DEPARTMENT Provider Note   CSN: [id number] Arrival date & time: 05/24/21  [id number]  An emergency department physician performed an initial assessment on this suspected stroke patient at 0608.  History Chief Complaint  Patient presents with   Code Stroke    Sherry [name] is a 87 y.o. female.  The history is provided by the EMS personnel and medical records.  Sherry [name] is a 87 y.o. female who presents to the Emergency Department complaining of code stroke. Level V caveat due to speech difficulties.  Hx is provided by EMS.  She presents to the ED by EMS for right sided weakness and speech difficulties that started this morning.  She was last known well at [facility].  Later she developed acute onset right sided weakness with slurred speech.  Lives with daughter.    Past Medical History:  Diagnosis Date   Acid reflux    Arthritis    Atrial fibrillation (HCC)    Chronic constipation    GERD (gastroesophageal reflux disease)    Hyperlipidemia    Hypertension    Osteoarthritis    Pressure ulcer of sacral region     Patient Active Problem List   Diagnosis Date Noted   Stroke (cerebrum) ([place]) 05/24/2021   Callus 04/07/2021   Pelvic pain 11/25/2020   Slow transit constipation 11/25/2020   Atrial fibrillation ([place]) 10/17/2020    Past Surgical History:  Procedure Laterality Date   NO PAST SURGERIES       OB History     Gravida  11   Para      Term      Preterm      AB  1   Living  10      SAB  1   IAB      Ectopic      Multiple      Live Births  76           Family History  Problem Relation Age of Onset   Hypertension Mother    Other Brother        malignant neoplastic disease   Colon cancer Neg Hx    Stomach cancer Neg Hx    Esophageal cancer Neg Hx    Pancreatic cancer Neg Hx     Social History   Tobacco Use   Smoking status: Former   Smokeless tobacco: Never  Scientific laboratory technician Use: Never used   Substance Use Topics   Alcohol use: No   Drug use: No    Home Medications Prior to Admission medications   Medication Sig Start Date End Date Taking? Authorizing Provider  acetaminophen (TYLENOL) 500 MG tablet Take 500 mg by mouth every 6 (six) hours as needed for mild pain, fever or headache.    [provider]  aspirin EC 81 MG tablet Take 81 mg by mouth daily. Swallow whole.    [provider]  diltiazem (CARDIZEM) 30 MG tablet Take 30 mg by mouth 2 (two) times daily. 01/02/20   [provider]  metoCLOPramide (REGLAN) 5 MG tablet Take 5 mg by mouth 2 (two) times daily. 10/03/20   [provider]  metoprolol tartrate (LOPRESSOR) 50 MG tablet Take 50 mg by mouth 2 (two) times daily. 05/06/20   [provider]  nystatin (MYCOSTATIN) 100000 UNIT/ML suspension Use 4-6 mL swish and swallow 4 times daily for 14 days 05/15/21   Levin Erp, PA  omeprazole (PRILOSEC) 40  MG capsule Take 1 capsule (40 mg total) by mouth daily. 05/15/21   Unk Lightning, PA  traMADol Janean Sark) 50 MG tablet Take 1/2-1 pill po bid prn pain 12/05/20   Cristie Hem, PA-C    Allergies    Patient has no known allergies.  Review of Systems   Review of Systems  All other systems reviewed and are negative.  Physical Exam Updated Vital Signs BP (!) 178/90   Pulse 71   Temp 97.7 F (36.5 C) (Oral)   Resp 20   SpO2 99%   Physical Exam Vitals and nursing note reviewed.  Constitutional:      Appearance: She is well-developed.  HENT:     Head: Normocephalic and atraumatic.  Cardiovascular:     Rate and Rhythm: Normal rate and regular rhythm.  Pulmonary:     Effort: Pulmonary effort is normal. No respiratory distress.  Abdominal:     Palpations: Abdomen is soft.     Tenderness: There is no abdominal tenderness. There is no guarding or rebound.  Musculoskeletal:        General: No tenderness.  Skin:    General: Skin is warm and dry.   Neurological:     Mental Status: She is alert.     Comments: Right-sided facial weakness. Dense aphasia. Right-sided weakness. Left-sided gaze preference.  Psychiatric:     Comments: Unable to assess    ED Results / Procedures / Treatments   Labs (all labs ordered are listed, but only abnormal results are displayed) Labs Reviewed  CBC - Abnormal; Notable for the following components:      Result Value   RDW 16.0 (*)    All other components within normal limits  COMPREHENSIVE METABOLIC PANEL - Abnormal; Notable for the following components:   CO2 21 (*)    Creatinine, Ser 1.21 (*)    Albumin 3.2 (*)    GFR, Estimated 40 (*)    All other components within normal limits  I-STAT CHEM 8, ED - Abnormal; Notable for the following components:   Creatinine, Ser 1.20 (*)    Calcium, Ion 1.09 (*)    All other components within normal limits  CBG MONITORING, ED - Abnormal; Notable for the following components:   Glucose-Capillary 104 (*)    All other components within normal limits  RESP PANEL BY RT-PCR (FLU A&B, COVID) [id number]  PROTIME-INR  APTT  DIFFERENTIAL    EKG None  Radiology CT HEAD CODE STROKE WO CONTRAST  Result Date: 05/24/2021 CLINICAL DATA:  Code stroke. 87 year old female with right side weakness and aphasia. EXAM: CT HEAD WITHOUT CONTRAST TECHNIQUE: Contiguous axial images were obtained from the base of the skull through the vertex without intravenous contrast. COMPARISON:  None. FINDINGS: Brain: At the time of this report only coronal and sagittal axial brain windows are provided, due to kyphosis the native image acquisition is coronal. No acute intracranial hemorrhage identified. No midline shift, mass effect, or evidence of intracranial mass lesion. Patchy and confluent bilateral white matter hypodensity. No cortically based acute infarct identified. No ventriculomegaly. Bilateral basal ganglia vascular calcifications. Vascular: Calcified atherosclerosis at the skull  base. No suspicious intracranial vascular hyperdensity. Skull: No acute osseous abnormality identified. Sinuses/Orbits: Visualized paranasal sinuses and mastoids are clear. Other: Mildly Disconjugate gaze.  Negative scalp. ASPECTS [facility] Stroke Program Early CT Score) Total score (0-10 with 10 being normal): 10 IMPRESSION: 1. No acute cortically based infarct or acute intracranial hemorrhage identified. 2. Moderate for age cerebral white matter  changes. 3. Study discussed by telephone with Dr. Roland Rack on 05/24/2021 at 06:35. At 0629 hours. Electronically Signed   By: Genevie Ann M.D.   On: 05/24/2021 06:36   CT ANGIO HEAD NECK W WO CM (CODE STROKE)  Result Date: 05/24/2021 CLINICAL DATA:  Code stroke. 87 year old female with right side weakness and aphasia. EXAM: CT ANGIOGRAPHY HEAD AND NECK TECHNIQUE: Multidetector CT imaging of the head and neck was performed using the standard protocol during bolus administration of intravenous contrast. Multiplanar CT image reconstructions and MIPs were obtained to evaluate the vascular anatomy. Carotid stenosis measurements (when applicable) are obtained utilizing NASCET criteria, using the distal internal carotid diameter as the denominator. CONTRAST:  62mL OMNIPAQUE IOHEXOL 350 MG/ML SOLN COMPARISON:  Plain head CT 0616 hours today. FINDINGS: CTA NECK Skeleton: Absent dentition. Cervical spine degeneration with multilevel acquired ankylosis. No acute osseous abnormality identified. Upper chest: Negative visible upper lungs. No superior mediastinal lymphadenopathy. Other neck: No acute finding in the neck. Aortic arch: Fusiform aneurysmal distal aortic arch (40 mm series 7, image 304) with abundant distal arch soft mural plaque or thrombus (series 7, image 303). Superimposed Calcified aortic atherosclerosis. Three vessel arch configuration. All of the anterior arch is not included. Right carotid system: Brachiocephalic artery and right CCA origin plaque without  stenosis. Tortuous right CCA. Bulky calcified plaque at the right ICA origin and bulb results in stenosis numerically estimated up to 65-70 % with respect to the distal vessel on series 7, image 185. The right ICA remains patent to the skull base. Left carotid system: Negative visible left CCA origin. Tortuous proximal left CCA. Mild plaque proximal to the carotid bifurcation. But bulky calcified plaque at the bifurcation involving the left ICA origin with high-grade stenosis approaching a radiographic string sign on series 7, image 191. But the left ICA remains patent and is tortuous to the skull base without additional stenosis. Vertebral arteries: Bulky calcified plaque at the right subclavian artery origin but less than 50% stenosis. Calcified plaque at the right vertebral artery origin with moderate stenosis on series 7, image 256. The right vertebral is patent and tortuous in the neck, with no additional stenosis to the skull base. Bulky calcified plaque throughout the proximal left subclavian artery, and involving the left vertebral artery origin with moderate to severe stenosis on series 8, image 115. But the left vertebral remains patent, is tortuous in the neck but without additional stenosis to the skull base. CTA HEAD Suboptimal intracranial contrast bolus. Posterior circulation: Calcified plaque of the left vertebral artery as it crosses the dura but only mild stenosis suspected. No distal right vertebral artery stenosis. Patent PICA origins, vertebrobasilar junction and basilar artery without stenosis. Patent SCA and PCA origins. Posterior communicating arteries are diminutive or absent. Bilateral PCA branches appear patent. Anterior circulation: Heavily calcified bilateral ICA siphons. But both siphons are patent to the carotid termini. Moderate cavernous and severe supraclinoid stenosis on the left (series 7 image 111. Similar severe right ICA siphon stenosis at the anterior genu (image 115). Patent  carotid termini. Patent MCA and ACA origins. Proximal ACA branches appear within normal limits. Both MCA M1 segments are patent. No proximal left M2 branch occlusion is identified. Similar appearance of patent right MCA M1 and proximal right MCA branches. Venous sinuses: Patent. Anatomic variants: None. Review of the MIP images confirms the above findings IMPRESSION: 1. Suboptimal intracranial contrast bolus with no large vessel occlusion identified. 2. But positive for widespread bulky calcified atherosclerosis resulting in  Severe Stenoses: - Left ICA origin stenoses approaches a RADIOGRAPHIC STRING SIGN. - Right ICA bulb 65-70% stenosis. - bilateral ICA siphon Severe stenosis (supraclinoid). - Left vertebral artery origin. 3. 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 Aortic Atherosclerosis (ICD10-I70.0). Preliminary results discussed by telephone with Dr. Roland Rack on 05/24/2021 at 0629 hours. Electronically Signed   By: Genevie Ann M.D.   On: 05/24/2021 06:48    Procedures Procedures   Medications Ordered in ED Medications  sodium chloride flush (NS) 0.9 % injection 3 mL (has no administration in time range)   stroke: mapping our early stages of recovery book (has no administration in time range)  0.9 %  sodium chloride infusion (has no administration in time range)  acetaminophen (TYLENOL) tablet 650 mg (has no administration in time range)    Or  acetaminophen (TYLENOL) 160 MG/5ML solution 650 mg (has no administration in time range)    Or  acetaminophen (TYLENOL) suppository 650 mg (has no administration in time range)  labetalol (NORMODYNE) injection 10 mg (has no administration in time range)    And  nicardipine (CARDENE) 20mg  in 0.86% saline 256ml IV infusion (0.1 mg/ml) (has no administration in time range)  pantoprazole (PROTONIX) injection 40 mg (has no administration in time range)  tenecteplase (TNKASE) injection for  Stroke 12 mg (12 mg Intravenous Given 05/24/21 0630)  iohexol (OMNIPAQUE) 350 MG/ML injection 75 mL (75 mLs Intravenous Contrast Given 05/24/21 [id number])    ED Course  I have reviewed the triage vital signs and the nursing notes.  Pertinent labs & imaging results that were available during my care of the patient were reviewed by me and considered in my medical decision making (see chart for details).    MDM Rules/Calculators/A&P                          patient presents to the emergency department as a code stroke. She has significant right-sided deficits. Airway cleared on ED arrival for advance imaging. TPA started by neurology after family consent. Plan to admit to neurology service for ongoing treatment.  Final Clinical Impression(s) / ED Diagnoses Final diagnoses:  Acute ischemic stroke [facility])    Rx / DC Orders ED Discharge Orders     None        Quintella Reichert, MD 05/24/21 [phone]

## 2021-05-24 NOTE — Progress Notes (Signed)
PT Cancellation Note  Patient Details Name: LATIANA TOMEI MRN: [id number] DOB: 1920/01/11   Cancelled Treatment:    Reason Eval/Treat Not Completed: Active bedrest order. TNK received 11/5 0630.    Ilda Foil 05/24/2021, 10:16 AM  Aida Raider, PT  Office # [phone] Pager [phone]

## 2021-05-24 NOTE — ED Triage Notes (Signed)
Pt bib [facility] as a code stroke. Daughter noticed right sided facial droop and weakness, slurred speech. LKN 4388. No blood thinners.  VS PTA- BP 166/92, CBG 103, HR 74

## 2021-05-24 NOTE — Evaluation (Signed)
Clinical/Bedside Swallow Evaluation Patient Details  Name: Sherry [name] MRN: [id number] Date of Birth: 1920-03-21  Today's Date: 05/24/2021 Time: SLP Start Time (ACUTE ONLY): 0920 SLP Stop Time (ACUTE ONLY): 0932 SLP Time Calculation (min) (ACUTE ONLY): 12 min  Past Medical History:  Past Medical History:  Diagnosis Date   Acid reflux    Arthritis    Atrial fibrillation (HCC)    Chronic constipation    GERD (gastroesophageal reflux disease)    Hyperlipidemia    Hypertension    Osteoarthritis    Pressure ulcer of sacral region    Past Surgical History:  Past Surgical History:  Procedure Laterality Date   NO PAST SURGERIES     HPI:  Sherry [name] is a 87 y.o. female with a history of hypertension, hyperlipidemia, atrial fibrillation not on anticoagulation who presented to the ED with right-sided weakness that started this morning.   She was in the bathroom when her daughter found her slumped toward the right and 911 was called.  CT of the head was showing no acute cortically based infarct or acute intracranial hemorrhage.  She is s/p IV thrombolytic therapy.    Assessment / Plan / Recommendation  Clinical Impression  Clinical swallowing evaluation was completed in setting of admission for stroke.  Per bedside RN patient failed the [facility] Swallowing Protocol.  Cranial nerve exam was attempted and she was unable to follow any commands.  Significant droop on the right side of her face was seen.  She likely has reduced labial seal on the right as well.  She talked continuously throughout evaluation.  Unfortunately speech was mostly unintelligible.  She was observed to have difficulty managing her secretions with escape of saliva from the right side of her lips observed.  She presented with a likely oral and pharyngeal dysphagia.  Decreased bolus awareness was noted and she usually did not close her lips around presented spoon.  Anterior escape from the right was seen given thin liquids.   Entire puree bolus was lost anteriorly.  Pharyngeal swallow was triggered given thin liquid bolus only.  Obvious s/s of aspiration were not seen on that one trial.  Given her reduced awareness to presented bolus trials suggest she remain NPO.  ST will continue to follow for PO readiness. SLP Visit Diagnosis: Dysphagia, unspecified (R13.10)    Aspiration Risk  Severe aspiration risk    Diet Recommendation   NPO  Medication Administration: Via alternative means    Other  Recommendations Oral Care Recommendations: Oral care QID    Recommendations for follow up therapy are one component of a multi-disciplinary discharge planning process, led by the attending physician.  Recommendations may be updated based on patient status, additional functional criteria and insurance authorization.  Follow up Recommendations Other (comment) (TBD)      Frequency and Duration min 2x/week  2 weeks       Prognosis Prognosis for Safe Diet Advancement: Fair      Swallow Study   General Date of Onset: 05/24/21 HPI: Sherry [name] is a 87 y.o. female with a history of hypertension, hyperlipidemia, atrial fibrillation not on anticoagulation who presented to the ED with right-sided weakness that started this morning.   She was in the bathroom when her daughter found her slumped toward the right and 911 was called.  CT of the head was showing no acute cortically based infarct or acute intracranial hemorrhage.  She is s/p IV thrombolytic therapy. Type of Study: Bedside Swallow Evaluation Previous Swallow  Assessment: None noted at [facility]. Diet Prior to this Study: NPO Temperature Spikes Noted: No Respiratory Status: Room air History of Recent Intubation: No Behavior/Cognition: Alert;Confused;Doesn't follow directions Oral Cavity Assessment: Excessive secretions Oral Care Completed by SLP: No Self-Feeding Abilities: Total assist Patient Positioning: Upright in bed Baseline Vocal Quality: Low vocal  intensity Volitional Cough: Cognitively unable to elicit Volitional Swallow: Unable to elicit    Oral/Motor/Sensory Function Overall Oral Motor/Sensory Function: Other (comment) (Unable to assess)   Ice Chips Ice chips: Impaired Presentation: Spoon Oral Phase Impairments: Reduced labial seal;Impaired mastication Oral Phase Functional Implications: Prolonged oral transit;Right anterior spillage Pharyngeal Phase Impairments: Suspected delayed Swallow   Thin Liquid Thin Liquid: Impaired Presentation: Spoon Oral Phase Impairments: Reduced labial seal;Reduced lingual movement/coordination Oral Phase Functional Implications: Right anterior spillage;Prolonged oral transit Pharyngeal  Phase Impairments: Suspected delayed Swallow    Nectar Thick Nectar Thick Liquid: Not tested   Honey Thick Honey Thick Liquid: Not tested   Puree Puree: Impaired Presentation: Spoon Oral Phase Impairments: Poor awareness of bolus Oral Phase Functional Implications: Right anterior spillage (entire bolus lost anteriorly) Pharyngeal Phase Impairments:  (No pharyngeal swallow initiated)   Solid     Solid: Not tested     Shelly Flatten, MA, CCC-SLP Acute Rehab SLP [phone]  Lamar Sprinkles 05/24/2021,9:35 AM

## 2021-05-24 NOTE — Progress Notes (Signed)
Pt was seen by Dr. Amada Jupiter around [place] today.  S/p TNK. Doing well, no HA, no worsening of symptoms.  Now in the ICU. Will follow.

## 2021-05-24 NOTE — Code Documentation (Signed)
Responded to Code Stroke called at 0553 for R arm drift, facial droop, and slurred speech. Pt arrived at 0604, NIH-22, CT head-negative for acute changes. TNK started at 0630. CTA-no LVO. Plan to admit to ICU.

## 2021-05-24 NOTE — Progress Notes (Signed)
PHARMACIST CODE STROKE RESPONSE  Notified to mix TNK at 0621 by Dr. Amada Jupiter Delivered TNK to RN at 0625  TNK dose = 12 mg IV over 5 seconds  Issues/delays encountered (if applicable): new IV site needed, TNKase administration delayed until 0630  Eddie Candle 05/24/21 6:34 AM

## 2021-05-25 ENCOUNTER — Inpatient Hospital Stay (HOSPITAL_COMMUNITY): Payer: Medicare Other

## 2021-05-25 DIAGNOSIS — I639 Cerebral infarction, unspecified: Secondary | ICD-10-CM | POA: Diagnosis not present

## 2021-05-25 LAB — HEMOGLOBIN A1C
Hgb A1c MFr Bld: 5.3 % (ref 4.8–5.6)
Mean Plasma Glucose: 105.41 mg/dL

## 2021-05-25 LAB — BASIC METABOLIC PANEL
Anion gap: 13 (ref 5–15)
BUN: 10 mg/dL (ref 8–23)
CO2: 23 mmol/L (ref 22–32)
Calcium: 9.4 mg/dL (ref 8.9–10.3)
Chloride: 102 mmol/L (ref 98–111)
Creatinine, Ser: 1.01 mg/dL — ABNORMAL HIGH (ref 0.44–1.00)
GFR, Estimated: 50 mL/min — ABNORMAL LOW (ref >60)
Glucose, Bld: 71 mg/dL (ref 70–99)
Potassium: 4.5 mmol/L (ref 3.5–5.1)
Sodium: 138 mmol/L (ref 135–145)

## 2021-05-25 LAB — LIPID PANEL
Cholesterol: 208 mg/dL — ABNORMAL HIGH (ref 0–200)
HDL: 53 mg/dL (ref >40)
LDL Cholesterol: 135 mg/dL — ABNORMAL HIGH (ref 0–99)
Total CHOL/HDL Ratio: 3.9 RATIO
Triglycerides: 100 mg/dL (ref <150)
VLDL: 20 mg/dL (ref 0–40)

## 2021-05-25 LAB — CBC
HCT: 39.4 % (ref 36.0–46.0)
Hemoglobin: 12.6 g/dL (ref 12.0–15.0)
MCH: 26.9 pg (ref 26.0–34.0)
MCHC: 32 g/dL (ref 30.0–36.0)
MCV: 84.2 fL (ref 80.0–100.0)
Platelets: 156 10*3/uL (ref 150–400)
RBC: 4.68 MIL/uL (ref 3.87–5.11)
RDW: 16 % — ABNORMAL HIGH (ref 11.5–15.5)
WBC: 5.1 10*3/uL (ref 4.0–10.5)
nRBC: 0 % (ref 0.0–0.2)

## 2021-05-25 MED ORDER — ASPIRIN 81 MG PO CHEW
81.0000 mg | CHEWABLE_TABLET | Freq: Every day | ORAL | Status: DC
  Administered 2021-05-25 – 2021-05-28 (×4): 81 mg via ORAL
  Filled 2021-05-25 (×4): qty 1

## 2021-05-25 NOTE — Evaluation (Signed)
Physical Therapy Evaluation Patient Details Name: Sherry [name] MRN: [id number] DOB: Aug 05, 1919 Today's Date: 05/25/2021  History of Present Illness  Pt is 87 yo female who presents after slumping over on toilet with daughter. Pt found to have L MCA infarct on MRI and received TNK. PMH: arthritis, GERD, a-fib.  Clinical Impression  Pt admitted with above diagnosis. Pt received in bed, difficult to accurately assess strength and cognition due to expressive and receptive aphasia. Pt moves all 4 extremities and with low level mobility demonstrates weakness in all 4. Pt required max A to come to EOB and needed max A to maintain sitting due to posterior lean and gravitational insecurity. Pt achieved partial sit>stand with max A. Very fatigued after mobility. Pt's son reports that they have very good family support and want pt to go home regardless of her functional level. Agree that she will so best in her home environment.  Pt currently with functional limitations due to the deficits listed below (see PT Problem List). Pt will benefit from skilled PT to increase their independence and safety with mobility to allow discharge to the venue listed below.          Recommendations for follow up therapy are one component of a multi-disciplinary discharge planning process, led by the attending physician.  Recommendations may be updated based on patient status, additional functional criteria and insurance authorization.  Follow Up Recommendations Home health PT    Assistance Recommended at Discharge Frequent or constant Supervision/Assistance  Functional Status Assessment Patient has had a recent decline in their functional status and demonstrates the ability to make significant improvements in function in a reasonable and predictable amount of time.  Equipment Recommendations  None recommended by PT    Recommendations for Other Services OT consult     Precautions / Restrictions Precautions Precautions:  Fall Restrictions Weight Bearing Restrictions: No      Mobility  Bed Mobility Overal bed mobility: Needs Assistance Bed Mobility: Supine to Sit;Sit to Supine     Supine to sit: Max assist Sit to supine: Max assist;+2 for physical assistance   General bed mobility comments: pt with noted fear of falling as she came to EOB. Max A for LE's and trunk. Pt depdendent with transition back to supine, lacking sufficient strength to bring LE's up into bed    Transfers Overall transfer level: Needs assistance   Transfers: Sit to/from Stand Sit to Stand: Max assist           General transfer comment: pt able to take wt through LE's and achieve partial stand with max A. Did not get to full standing in part due to pt fear    Ambulation/Gait             General Gait Details: unable today  Stairs            Wheelchair Mobility    Modified Rankin (Stroke Patients Only) Modified Rankin (Stroke Patients Only) Pre-Morbid Rankin Score: Moderately severe disability Modified Rankin: Severe disability     Balance Overall balance assessment: Needs assistance Sitting-balance support: Bilateral upper extremity supported;Feet supported Sitting balance-Leahy Scale: Zero Sitting balance - Comments: posterior lean in sitting, max A to prevent falling bkwds in sitting. Could shift wt fwd a bit when cued Postural control: Posterior lean Standing balance support: Bilateral upper extremity supported Standing balance-Leahy Scale: Zero  Pertinent Vitals/Pain Pain Assessment: Faces Faces Pain Scale: No hurt    Home Living Family/patient expects to be discharged to:: Private residence Living Arrangements: Children Available Help at Discharge: Family;Available 24 hours/day Type of Home: House Home Access: Ramped entrance       Home Layout: One level Home Equipment: Conservation officer, nature (2 wheels);BSC;Shower seat;Wheelchair - manual       Prior Function Prior Level of Function : Needs assist       Physical Assist : Mobility (physical);ADLs (physical) Mobility (physical): Gait ADLs (physical): Bathing;Dressing Mobility Comments: son reports that pt was able to ambulate with her RW around her home with PT or family behind her for safety ADLs Comments: son reports that nursing came and gave her baths     Hand Dominance        Extremity/Trunk Assessment   Upper Extremity Assessment Upper Extremity Assessment: Defer to OT evaluation    Lower Extremity Assessment Lower Extremity Assessment: Generalized weakness    Cervical / Trunk Assessment Cervical / Trunk Assessment: Kyphotic  Communication   Communication: Receptive difficulties;Expressive difficulties  Cognition Arousal/Alertness: Awake/alert Behavior During Therapy: Flat affect Overall Cognitive Status: Impaired/Different from baseline Area of Impairment: Following commands;Problem solving                       Following Commands: Follows one step commands inconsistently;Follows one step commands with increased time     Problem Solving: Slow processing;Decreased initiation;Difficulty sequencing;Requires tactile cues General Comments: pt with receptive and expressive aphasia as well as no teeth and low voice so difficult to accurately assess        General Comments General comments (skin integrity, edema, etc.): BP 167/83, HR 90 bpm A fib, SPO2 97% on RA    Exercises     Assessment/Plan    PT Assessment Patient needs continued PT services  PT Problem List Decreased strength;Decreased activity tolerance;Decreased balance;Decreased mobility;Decreased coordination;Decreased cognition;Decreased knowledge of precautions       PT Treatment Interventions DME instruction;Gait training;Functional mobility training;Therapeutic activities;Therapeutic exercise;Balance training;Neuromuscular re-education;Cognitive remediation;Patient/family  education    PT Goals (Current goals can be found in the Care Plan section)  Acute Rehab PT Goals Patient Stated Goal: son reports pt's family wants her to come home regardless of functional status and they will care for her PT Goal Formulation: With family Time For Goal Achievement: 06/08/21 Potential to Achieve Goals: Fair    Frequency Min 4X/week   Barriers to discharge        Co-evaluation               AM-PAC PT "6 Clicks" Mobility  Outcome Measure Help needed turning from your back to your side while in a flat bed without using bedrails?: Total Help needed moving from lying on your back to sitting on the side of a flat bed without using bedrails?: Total Help needed moving to and from a bed to a chair (including a wheelchair)?: Total Help needed standing up from a chair using your arms (e.g., wheelchair or bedside chair)?: Total Help needed to walk in hospital room?: Total Help needed climbing 3-5 steps with a railing? : Total 6 Click Score: 6    End of Session   Activity Tolerance: Patient limited by fatigue Patient left: in bed;with call bell/phone within reach;with bed alarm set;with family/visitor present Nurse Communication: Mobility status PT Visit Diagnosis: Muscle weakness (generalized) (M62.81);Unsteadiness on feet (R26.81);Difficulty in walking, not elsewhere classified (R26.2)    Time: [id number] PT Time  Calculation (min) (ACUTE ONLY): 18 min   Charges:   PT Evaluation $PT Eval Moderate Complexity: [place]  Pager [phone] Office [place] 05/25/2021, 11:42 AM

## 2021-05-25 NOTE — Evaluation (Signed)
Speech Language Pathology Evaluation Patient Details Name: Sherry [name] MRN: [id number] DOB: 12-11-1919 Today's Date: 05/25/2021 Time: 0865-7846 SLP Time Calculation (min) (ACUTE ONLY): 20 min  Problem List:  Patient Active Problem List   Diagnosis Date Noted   Stroke (cerebrum) (HCC) 05/24/2021   Callus 04/07/2021   Pelvic pain 11/25/2020   Slow transit constipation 11/25/2020   Atrial fibrillation (HCC) 10/17/2020   Past Medical History:  Past Medical History:  Diagnosis Date   Acid reflux    Arthritis    Atrial fibrillation (HCC)    Chronic constipation    GERD (gastroesophageal reflux disease)    Hyperlipidemia    Hypertension    Osteoarthritis    Pressure ulcer of sacral region    Past Surgical History:  Past Surgical History:  Procedure Laterality Date   NO PAST SURGERIES     HPI:  Sherry [name] is a 87 y.o. female with a history of hypertension, hyperlipidemia, atrial fibrillation not on anticoagulation who presented to the ED with right-sided weakness that started this morning.   She was in the bathroom when her daughter found her slumped toward the right and 911 was called.  CT of the head was showing no acute cortically based infarct or acute intracranial hemorrhage.  She is s/p IV thrombolytic therapy.   Assessment / Plan / Recommendation Clinical Impression   [name] demonstrates an expressive>receptive aphasia with co-occurring dysarthria. Pt has baseline dementia associated with her advanced age. She has R sided flaccidity impacting speech intelligibility. She is also edentulous with low vocal intensity. Pt demonstrates weaknesses in following multi-step directions, answering complex yes/no questions, and participating in complex conversation. She is able to follow one step directions the majority of the time and participate in simple conversation regarding self and family. Expressively, pt's speech is c/b perseveration. She demonstrates some spontaneous use  of circumlocution. For example, she said "white hair" when asked who the president was. She answered basic questions with one word responses, but was noted to perseverate during confrontation naming tasks. Intelligibility is reduced at 70% for single word utterances and 50% for phrases. She is stimulable for increased intelligibility when  asked to repeat.  Pt is hard of hearing and benefits from seeing speaker's mouth for comprehension.   Pt is expected to benefit from speech therapy during hospitalization as well as at next level of care (home health)  to increase functional communication.    SLP Assessment  SLP Recommendation/Assessment: Patient needs continued Speech Lanaguage Pathology Services SLP Visit Diagnosis: Dysphagia, oral phase (R13.11);Aphasia (R47.01);Dysarthria and anarthria (R47.1)    Recommendations for follow up therapy are one component of a multi-disciplinary discharge planning process, led by the attending physician.  Recommendations may be updated based on patient status, additional functional criteria and insurance authorization.    Follow Up Recommendations  Home health SLP;Skilled Nursing facility    Frequency and Duration min 2x/week  2 weeks      SLP Evaluation Cognition  Overall Cognitive Status: Impaired/Different from baseline Arousal/Alertness: Awake/alert Orientation Level: Oriented to person;Oriented to place;Oriented to situation;Disoriented to time Behaviors: Perseveration       Comprehension  Auditory Comprehension Overall Auditory Comprehension: Impaired Yes/No Questions: Impaired Complex Questions: 25-49% accurate Commands: Impaired Two Step Basic Commands: 25-49% accurate Multistep Basic Commands: 25-49% accurate Complex Commands: 25-49% accurate Conversation: Complex Visual Recognition/Discrimination Discrimination: Not tested Reading Comprehension Reading Status: Not tested    Expression Expression Primary Mode of Expression:  Verbal Verbal Expression Overall Verbal Expression: Impaired Automatic  Speech: Name;Social Response Repetition: Impaired Naming: Impairment Verbal Errors: Language of confusion;Not aware of errors;Perseveration Pragmatics: No impairment Effective Techniques: Semantic cues   Oral / Motor  Oral Motor/Sensory Function Overall Oral Motor/Sensory Function: Moderate impairment Facial ROM: Reduced right Facial Symmetry: Abnormal symmetry right Facial Strength: Reduced right Lingual ROM: Reduced right Lingual Symmetry: Abnormal symmetry right Lingual Strength: Reduced;Suspected CN XII (hypoglossal) dysfunction Velum: Within Functional Limits Mandible: Impaired Motor Speech Overall Motor Speech: Impaired Respiration: Within functional limits Phonation: Low vocal intensity Resonance: Within functional limits Articulation: Impaired Intelligibility: Intelligibility reduced Word: 75-100% accurate Phrase: 50-74% accurate Sentence: 25-49% accurate Conversation: 0-24% accurate Motor Planning: Witnin functional limits Motor Speech Errors: Not applicable                     Kylene Zamarron P. Timisha Mondry, M.S., CCC-SLP Speech-Language Pathologist Acute Rehabilitation Services Pager: [phone]   Susanne Borders Niamh Rada 05/25/2021, 9:33 AM

## 2021-05-25 NOTE — Progress Notes (Signed)
PT Cancellation Note  Patient Details Name: Sherry [name] MRN: [id number] DOB: [birth year]   Cancelled Treatment:    Reason Eval/Treat Not Completed: Active bedrest order (TNK received 0630 11/5). PT order received, pt still with active bedrest orders, please update activity orders when pt appropriate to be seen by therapies.   Lyanne Co, PT  Acute Rehab Services  Pager [phone] Office [phone]    Lawana Chambers Jullie Arps 05/25/2021, 7:30 AM

## 2021-05-25 NOTE — Progress Notes (Signed)
  Echocardiogram 2D Echocardiogram has been performed.  Roosvelt Maser F 05/25/2021, 3:11 PM

## 2021-05-25 NOTE — Progress Notes (Signed)
STROKE TEAM PROGRESS NOTE   INTERVAL HISTORY Patient is seen in room with her son and her RN at bedside.  Symptoms have improved since yesterday.  She has been hemodynamically stable overnight with no acute events.  Discussed left ICA stenosis  and options for treatment with patient's son.  He plans to discuss these options with his siblings today.  There is a question about a pressure ulcer from epic alert.   Vitals:   05/25/21 0600 05/25/21 0700 05/25/21 0707 05/25/21 0800  BP: 105/90 (!) 178/150 (!) 153/93   Pulse: 85 88    Resp: 16 20    Temp:    (!) 97.4 F (36.3 C)  TempSrc:    Oral  SpO2: 98% 97%     CBC:  Recent Labs  Lab 05/24/21 0715 05/24/21 0720  WBC 4.4  --   NEUTROABS 2.7  --   HGB 13.2 14.6  HCT 42.7 43.0  MCV 87.1  --   PLT 209  --    Basic Metabolic Panel:  Recent Labs  Lab 05/24/21 0715 05/24/21 0720  NA 135 136  K 4.1 3.8  CL 103 104  CO2 21*  --   GLUCOSE 88 88  BUN 16 18  CREATININE 1.21* 1.20*  CALCIUM 9.7  --    Lipid Panel:  Recent Labs  Lab 05/24/21 0715  CHOL 208*  TRIG 100  HDL 53  CHOLHDL 3.9  VLDL 20  LDLCALC 409*   HgbA1c:  Recent Labs  Lab 05/24/21 0715  HGBA1C 5.3   Urine Drug Screen: No results for input(s): LABOPIA, COCAINSCRNUR, LABBENZ, AMPHETMU, THCU, LABBARB in the last 168 hours.  Alcohol Level No results for input(s): ETH in the last 168 hours.  IMAGING past 24 hours MR BRAIN WO CONTRAST  Result Date: 05/25/2021 CLINICAL DATA:  86 year old female code stroke presentation yesterday with weakness, aphasia. EXAM: MRI HEAD WITHOUT CONTRAST TECHNIQUE: Multiplanar, multiecho pulse sequences of the brain and surrounding structures were obtained without intravenous contrast. COMPARISON:  CT head and CTA head and neck yesterday. FINDINGS: Brain: Patchy, confluent roughly 3 cm area of restricted diffusion in the posterior left operculum. Series 7, image 51. Patchy additional cortex and subcortical white matter involvement  in the anterior operculum on series 5, image 85. Insula and temporal lobe relatively spared. No other restricted diffusion. Chronic microhemorrhage in the left periatrial white matter posterior to the area of active ischemia. Minor T2 and FLAIR hyperintensity associated with the acute infarcts. No acute hemorrhage or mass effect. No other chronic cerebral blood products. Patchy left periatrial asymmetric white matter T2 and FLAIR hyperintensity. But elsewhere largely normal gray and white matter signal for age. No restricted diffusion to suggest acute infarction. No midline shift, mass effect, evidence of mass lesion, ventriculomegaly, extra-axial collection or acute intracranial hemorrhage. Vascular: Major intracranial vascular flow voids are preserved. Skull and upper cervical spine: Multilevel cervical spine degeneration. 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 Visualized bone marrow signal is within normal limits. Sinuses/Orbits: Negative, postoperative changes to the left globe. Other: Mastoids are well aerated. Visible internal auditory structures appear normal. IMPRESSION: 1. Patchy acute infarcts in the middle Left MCA territory, operculum. No associated acute hemorrhage or mass effect. 2. Mild for age underlying chronic small vessel disease, primarily in the left periatrial white matter. Electronically Signed   By: Odessa Fleming M.D.   On: 05/25/2021 05:45    PHYSICAL EXAM General: Patient is a well-developed female in no acute distress.   NEURO:  Mental  Status: AA&Ox2 Speech/Language: speech is without dysarthria or aphasia.  Unable to name objects but can repeat phrases  Cranial Nerves:  II: PERRL. Visual fields full.  III, IV, VI: EOMI. Eyelids elevate symmetrically.  V: Sensation is intact to light touch and symmetrical to face.  VII: Right sided facial droop  VIII: hearing intact to voice. IX, X: Palate elevates symmetrically. Phonation is normal.  [id number] shrug 5/5. XII: tongue is midline without  fasciculations. Motor: Right sided weakness present Tone: is normal and bulk is normal Sensation- Decreased on right side Coordination: FTN intact bilaterally, HKS: no ataxia in BLE.No drift.   Gait- deferred     ASSESSMENT/PLAN [name] is a 87 y.o. female with history of HTN, HLD and atrial fibrillation not on anticoagulation presenting with right sided weakness.  TNK was administered in the ED with some improvement of symptoms.   Stroke:  left MCA infarct Code Stroke CT head No acute infarct or hemorrhage . ASPECTS 10.  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 Scheduled for today. LDL 135 HgbA1c 5.3 VTE prophylaxis - SCDs    Diet   Diet NPO time specified   aspirin 81 mg daily prior to admission, now on aspirin 81 mg daily.  Therapy recommendations:  Home Health PT, OT pending Disposition:  pending  Hypertension Home meds:  none Stable Permissive hypertension (OK if <180/105) but gradually normalize in 5-7 days Long-term BP goal normotensive  Hyperlipidemia Home meds:  none LDL 135, goal < 70 High intensity statin deferred due to advanced age Continue statin at discharge  Atrial fibrillation Patient was not taking anticoagulation at home, likely due to advanced age Will discuss risks and benefits of anticoagulation with patient's family  Bilateral ICA stenosis Left ICA origin stenosis approaching radiographic string sign, right ICA bulb 65-70% stenosis seen on CTA head and neck Benefits of intervention unclear given patient's advanced age Will discuss possible interventions with vascular surgery and patient's family  Other Stroke Risk Factors Advanced Age >/= 15   Other Active Problems Healed pressure  injury noted to sacrum Continue offloading and routine skin protection measures  Hospital day # [place], NP   ATTENDING ATTESTATION:  PT s/p TNK.  CTA shows sig stenosis. Discussed with pt's son about possible CEA with sig. Risks given her age. He will discuss with family and make a final decision. Hold on catherther angiogram/surgery consultation until tomorrow. On aspirin but plavix held for now due to possible CEA, if they decline would add plavix to aspirin x 90 days given sig. Arterial diease.  No pressure ulcer on exam today.  Dr. Reeves Forth evaluated pt independently, reviewed imaging, chart, labs. Discussed and formulated plan with the APP. Please see APP note above for details.      This patient is critically ill due to respiratory distress, stroke s/p tPA and at significant risk of neurological worsening, death form heart failure, respiratory failure, recurrent stroke, bleeding from [facility], seizure, sepsis. This patient's care requires constant monitoring of vital signs, hemodynamics, respiratory and cardiac monitoring, review of multiple databases, neurological assessment, discussion with family, other specialists and medical decision making of high complexity. I spent 35 minutes of neurocritical care time in the care of this patient.  Odeal Welden,MD   To contact Stroke Continuity provider, please refer to [email or web address] After hours, contact General Neurology

## 2021-05-25 NOTE — Progress Notes (Signed)
At "missing hour" during time change, patient's vitals were: HR 83 (ECG), oxygen saturation 100%, respirations 18, BP 139/78 (96).  Patient follows commands. Modified NIH of 13.

## 2021-05-25 NOTE — Evaluation (Signed)
Clinical/Bedside Swallow Evaluation Patient Details  Name: Sherry [name] MRN: [id number] Date of Birth: 01-Sep-1919  Today's Date: 05/25/2021 Time: SLP Start Time (ACUTE ONLY): 0845 SLP Stop Time (ACUTE ONLY): 0855 SLP Time Calculation (min) (ACUTE ONLY): 10 min  Past Medical History:  Past Medical History:  Diagnosis Date   Acid reflux    Arthritis    Atrial fibrillation (HCC)    Chronic constipation    GERD (gastroesophageal reflux disease)    Hyperlipidemia    Hypertension    Osteoarthritis    Pressure ulcer of sacral region    Past Surgical History:  Past Surgical History:  Procedure Laterality Date   NO PAST SURGERIES     HPI:  Sherry [name] is a 87 y.o. female with a history of hypertension, hyperlipidemia, atrial fibrillation not on anticoagulation who presented to the ED with right-sided weakness that started this morning.   She was in the bathroom when her daughter found her slumped toward the right and 911 was called.  CT of the head was showing no acute cortically based infarct or acute intracranial hemorrhage.  She is s/p IV thrombolytic therapy.    Assessment / Plan / Recommendation  Clinical Impression  Sherry [name] was seen upright in bed for a repeat clinical swallow evaluation now that she is more alert/participatory. She participated in oral mech exam, which revealed significant R sided weakness/flaccidity. Pt demonstrates poor labial seal, which resulted in anterior spillage with liquids from cup. This is improved with use of straw placed on strong side (L). She had no s/s aspiration with thin liquids or purees. Per son who was present for eval, pt only consumes "blenderized" foods at baseline. Her diet is very mostly Ensure supplement drinks, but she also has blended vegetables daily. She requires feeding assist at baseline and was full assist for feeding today. She had adequate oral clearance after purees.  Recommend: thin liquids (via straw on strong side) and  pureed solids (dys 3), full assist for feeding, monitor for anterior spillage and pocketing, diligent oral care TID  SLP service to follow up x1 for aid/education with strategies for oral dysphagia.   SLP Visit Diagnosis: Dysphagia, oral phase (R13.11)    Aspiration Risk  Mild aspiration risk    Diet Recommendation   Dys 1; Thin liquids  Medication Administration: Crushed with puree Supervision: Staff to assist with self feeding Compensations: Slow rate;Small sips/bites;Follow solids with liquid;Monitor for anterior loss Postural Changes: Seated upright at 90 degrees;Remain upright for at least 30 minutes after po intake    Other  Recommendations Oral Care Recommendations: Oral care BID    Recommendations for follow up therapy are one component of a multi-disciplinary discharge planning process, led by the attending physician.  Recommendations may be updated based on patient status, additional functional criteria and insurance authorization.  Follow up Recommendations   Follow up for treatment of labial weakness to aid with anterior spillage     Frequency and Duration min 2x/week  1 week       Prognosis Prognosis for Safe Diet Advancement: Fair Barriers to Reach Goals: Cognitive deficits;Language deficits Barriers/Prognosis Comment: on blended diet at baseline per son's report      Swallow Study   General Date of Onset: 05/24/21 HPI: Sherry [name] is a 87 y.o. female with a history of hypertension, hyperlipidemia, atrial fibrillation not on anticoagulation who presented to the ED with right-sided weakness that started this morning.   She was in the bathroom when her  daughter found her slumped toward the right and 911 was called.  CT of the head was showing no acute cortically based infarct or acute intracranial hemorrhage.  She is s/p IV thrombolytic therapy. Previous Swallow Assessment: None noted at [facility]. Diet Prior to this Study: NPO Temperature Spikes Noted:  No Respiratory Status: Room air History of Recent Intubation: No Behavior/Cognition: Alert;Cooperative;Requires cueing Oral Cavity Assessment: Within Functional Limits Oral Care Completed by SLP: No Oral Cavity - Dentition: Edentulous Patient Positioning: Upright in bed Baseline Vocal Quality: Low vocal intensity Volitional Cough: Weak Volitional Swallow: Able to elicit    Oral/Motor/Sensory Function Overall Oral Motor/Sensory Function: Moderate impairment Facial ROM: Reduced right Facial Symmetry: Abnormal symmetry right Facial Strength: Reduced right Lingual ROM: Reduced right Lingual Symmetry: Abnormal symmetry right Lingual Strength: Reduced;Suspected CN XII (hypoglossal) dysfunction Velum: Within Functional Limits Mandible: Impaired   Ice Chips Ice chips: Within functional limits Presentation: Spoon   Thin Liquid Thin Liquid: Within functional limits Presentation: Straw;Spoon Oral Phase Functional Implications: Right anterior spillage    Nectar Thick   Not tested  Honey Thick   Not tested  Puree Puree: Within functional limits Presentation: Spoon   Solid       Deferred d/t baseline status    Avaley Coop P. Sherry [name], M.S., CCC-SLP Speech-Language Pathologist Acute Rehabilitation Services Pager: [phone]  Susanne Borders Arminda Foglio 05/25/2021,9:21 AM

## 2021-05-26 ENCOUNTER — Inpatient Hospital Stay (HOSPITAL_COMMUNITY): Payer: Medicare Other

## 2021-05-26 DIAGNOSIS — I6300 Cerebral infarction due to thrombosis of unspecified precerebral artery: Secondary | ICD-10-CM

## 2021-05-26 DIAGNOSIS — I639 Cerebral infarction, unspecified: Secondary | ICD-10-CM

## 2021-05-26 DIAGNOSIS — I4821 Permanent atrial fibrillation: Secondary | ICD-10-CM | POA: Diagnosis not present

## 2021-05-26 DIAGNOSIS — L8990 Pressure ulcer of unspecified site, unspecified stage: Secondary | ICD-10-CM | POA: Insufficient documentation

## 2021-05-26 LAB — BASIC METABOLIC PANEL
Anion gap: 8 (ref 5–15)
BUN: 13 mg/dL (ref 8–23)
CO2: 24 mmol/L (ref 22–32)
Calcium: 8.9 mg/dL (ref 8.9–10.3)
Chloride: 104 mmol/L (ref 98–111)
Creatinine, Ser: 0.93 mg/dL (ref 0.44–1.00)
GFR, Estimated: 55 mL/min — ABNORMAL LOW (ref >60)
Glucose, Bld: 116 mg/dL — ABNORMAL HIGH (ref 70–99)
Potassium: 3.6 mmol/L (ref 3.5–5.1)
Sodium: 136 mmol/L (ref 135–145)

## 2021-05-26 LAB — CBC
HCT: 35.6 % — ABNORMAL LOW (ref 36.0–46.0)
Hemoglobin: 11.9 g/dL — ABNORMAL LOW (ref 12.0–15.0)
MCH: 27.6 pg (ref 26.0–34.0)
MCHC: 33.4 g/dL (ref 30.0–36.0)
MCV: 82.6 fL (ref 80.0–100.0)
Platelets: 232 10*3/uL (ref 150–400)
RBC: 4.31 MIL/uL (ref 3.87–5.11)
RDW: 15.9 % — ABNORMAL HIGH (ref 11.5–15.5)
WBC: 5.7 10*3/uL (ref 4.0–10.5)
nRBC: 0 % (ref 0.0–0.2)

## 2021-05-26 MED ORDER — CLOPIDOGREL BISULFATE 75 MG PO TABS
75.0000 mg | ORAL_TABLET | Freq: Every day | ORAL | Status: DC
  Administered 2021-05-26 – 2021-05-28 (×3): 75 mg via ORAL
  Filled 2021-05-26 (×3): qty 1

## 2021-05-26 MED ORDER — ACETAMINOPHEN-CODEINE #3 300-30 MG PO TABS: 1.0000 | ORAL_TABLET | Freq: Every day | ORAL | Status: DC

## 2021-05-26 MED ORDER — DILTIAZEM HCL 60 MG PO TABS
30.0000 mg | ORAL_TABLET | Freq: Four times a day (QID) | ORAL | Status: DC
  Administered 2021-05-26 – 2021-05-28 (×9): 30 mg via ORAL
  Filled 2021-05-26 (×10): qty 1

## 2021-05-26 MED ORDER — ACETAMINOPHEN-CODEINE #3 300-30 MG PO TABS
0.5000 | ORAL_TABLET | Freq: Every day | ORAL | Status: DC
  Administered 2021-05-26 – 2021-05-27 (×2): 0.5 via ORAL
  Filled 2021-05-26 (×2): qty 1

## 2021-05-26 MED ORDER — ENOXAPARIN SODIUM 40 MG/0.4ML IJ SOSY
40.0000 mg | PREFILLED_SYRINGE | Freq: Every day | INTRAMUSCULAR | Status: DC
  Administered 2021-05-26 – 2021-05-27 (×2): 40 mg via SUBCUTANEOUS
  Filled 2021-05-26 (×2): qty 0.4

## 2021-05-26 MED ORDER — PANTOPRAZOLE SODIUM 40 MG PO TBEC
40.0000 mg | DELAYED_RELEASE_TABLET | Freq: Every day | ORAL | Status: DC
  Administered 2021-05-26 – 2021-05-27 (×2): 40 mg via ORAL
  Filled 2021-05-26 (×2): qty 1

## 2021-05-26 NOTE — TOC CAGE-AID Note (Signed)
Transition of Care [facility]) - CAGE-AID Screening   Patient Details  Name: HANNIE SHOE MRN: [id number] Date of Birth: Nov 25, 1919  [facility] CM/SW Contact:    Milley Vining C Tarpley-Carter, LCSWA Phone Number: 05/26/2021, 12:57 PM   Clinical Narrative: Pt is unable to participate in Cage Aid. Pt is not appropriate for assessment.  Arling Cerone Tarpley-Carter, MSW, LCSW-A Pronouns:  She/Her/Hers [facility] HealthTransitions of Care Clinical Social Worker Direct Number:  [phone] [email or web address]  CAGE-AID Screening: Substance Abuse Screening unable to be completed due to: : Patient unable to participate (Pt is not appropriate for assessment.)             Substance Abuse Education Offered: No

## 2021-05-26 NOTE — Consult Note (Signed)
NAME:  Sherry [name], MRN:  [id number], DOB:  1920-04-07, LOS: 2 ADMISSION DATE:  05/24/2021, CONSULTATION DATE: 05/26/2021 REFERRING MD: Dr. 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  She was admitted to stroke service after receiving TNKase.  Today she started with palpitation, her heart rate was ranging between 90-105, PCCM was consulted for help with management.  Pertinent  Medical History   Past Medical History:  Diagnosis Date   Acid reflux    Arthritis    Atrial fibrillation (HCC)    Chronic constipation    GERD (gastroesophageal reflux disease)    Hyperlipidemia    Hypertension    Osteoarthritis    Pressure ulcer of sacral region      Significant Hospital Events: Including procedures, antibiotic start and stop dates in addition to other pertinent events     Interim History / Subjective:    Objective   Blood pressure (!) 150/94, pulse 93, temperature 98.9 F (37.2 C), temperature source Axillary, resp. rate 14, SpO2 97 %.        Intake/Output Summary (Last 24 hours) at 05/26/2021 1459 Last data filed at 05/26/2021 1400 Gross per 24 hour  Intake 649.4 ml  Output 1050 ml  Net -400.6 ml   There were no vitals filed for this visit.  Examination:  General: Acute on chronically ill-appearing elderly  female, lying on the bed HEENT: [place]/AT, eyes anicteric.  moist mucus membranes  Neuro: Alert, awake, R facial droop, dysarthria, Rt side weakness  Chest: Coarse breath sounds, no wheezes or rhonchi Heart: irregularly, irregular, no murmurs or gallops Abdomen: Soft, nontender, nondistended, bowel sounds present Skin: No rash   Resolved Hospital Problem list     Assessment & Plan:  Chronic Atrial fibrillation L MCA stroke HTN L ICA  stenosis   Patient's HR is well controlled Will resume her diltiazem 30mg  q6h Will defer to Neurology for anticoagulation considering high CHADvasc but she has high risk of falls Continue secondary stroke prophylaxis Monitor BP  Patient is getting doppler of carotids  Best Practice (right click and "Reselect all SmartList Selections" daily)   Diet/type: dysphagia diet (see orders) DVT prophylaxis: LMWH GI prophylaxis: N/A Lines: N/A Foley:  N/A Code Status:  DNR Last date of multidisciplinary goals of care discussion [Per primary team]  Labs   CBC: Recent Labs  Lab 05/24/21 0715 05/24/21 0720 05/25/21 0949 05/26/21 0219  WBC 4.4  --  5.1 5.7  NEUTROABS 2.7  --   --   --   HGB 13.2 14.6 12.6 11.9*  HCT 42.7 43.0 39.4 35.6*  MCV 87.1  --  84.2 82.6  PLT 209  --  156 [id number]    Basic Metabolic Panel: Recent Labs  Lab 05/24/21 0715 05/24/21 0720 05/25/21 0949 05/26/21 0219  NA 135 136 138 136  K 4.1 3.8 4.5 3.6  CL 103 104 102 104  CO2 21*  --  23 24  GLUCOSE 88 88 71 116*  BUN 16 18 10 13   CREATININE 1.21* 1.20* 1.01* 0.93  CALCIUM 9.7  --  9.4 8.9   GFR: Estimated Creatinine Clearance: 24.1 mL/min (by C-G formula based on SCr of 0.93 mg/dL). Recent Labs  Lab 05/24/21 0715 05/25/21 0949 05/26/21 0219  WBC 4.4 5.1 5.7    Liver Function Tests: Recent Labs  Lab 05/24/21 0715  AST 28  ALT 11  ALKPHOS 108  BILITOT 1.2  PROT 6.7  ALBUMIN 3.2*   No results for input(s): LIPASE, AMYLASE in the last 168 hours. No results for input(s): AMMONIA in the last 168 hours.  ABG    Component Value Date/Time   TCO2 23 05/24/2021 0720     Coagulation Profile: Recent Labs  Lab 05/24/21 0715  INR 1.1    Cardiac Enzymes: No results for input(s): CKTOTAL, CKMB, CKMBINDEX, TROPONINI in the last 168 hours.  HbA1C: Hgb A1c MFr Bld  Date/Time Value Ref Range Status  05/24/2021 07:15 AM 5.3 4.8 - 5.6 % Final    Comment:    (NOTE) Pre diabetes:           5.7%-6.4%  Diabetes:              >6.4%  Glycemic control for   <7.0% adults with diabetes     CBG: Recent Labs  Lab 05/24/21 0637  GLUCAP 104*    Review of Systems:   12 point review of sytem is significant for complaints mentioned in HP, rest is negative   Past Medical History:  She,  has a past medical history of Acid reflux, Arthritis, Atrial fibrillation (HCC), Chronic constipation, GERD (gastroesophageal reflux disease), Hyperlipidemia, Hypertension, Osteoarthritis, and Pressure ulcer of sacral region.   Surgical History:   Past Surgical History:  Procedure Laterality Date   NO PAST SURGERIES       Social History:   reports that she has quit smoking. She has never used smokeless tobacco. She reports that she does not drink alcohol and does not use drugs.   Family History:  Her family history includes Hypertension in her mother; Other in her brother. There is no history of Colon cancer, Stomach cancer, Esophageal cancer, or Pancreatic cancer.   Allergies No Known Allergies   Home Medications  Prior to Admission medications   Medication Sig Start Date End Date Taking? Authorizing Provider  acetaminophen (TYLENOL) 500 MG tablet Take 500 mg by mouth every 6 (six) hours as needed for mild pain, fever or headache.   Yes [provider]  aspirin EC 81 MG tablet Take 81 mg by mouth daily. Swallow whole.   Yes [provider]  diltiazem (CARDIZEM) 30 MG tablet Take 30 mg by mouth 2 (two) times daily. 01/02/20  Yes [provider]  lactulose (CHRONULAC) 10 GM/15ML solution Take 10 g by mouth daily as needed for constipation. 12/20/20  Yes [provider]  metoCLOPramide (REGLAN) 5 MG tablet Take 5 mg by mouth 2 (two) times daily. 10/03/20  Yes [provider]  metoprolol tartrate (LOPRESSOR) 50 MG tablet Take 50 mg by mouth 2 (two) times daily. 05/06/20  Yes [provider]  omeprazole (PRILOSEC) 40 MG capsule Take 1  capsule (40 mg total) by mouth daily. 05/15/21  Yes Unk Lightning, PA  polyethylene glycol powder (GLYCOLAX/MIRALAX) 17 GM/SCOOP powder Take 17 g by mouth daily as needed for mild constipation.   Yes [provider]  traMADol (ULTRAM) 50 MG tablet Take 1/2-1 pill po bid prn pain Patient taking differently: Take 25-50 mg by mouth every 12 (twelve) hours as needed for moderate pain. 12/05/20  Yes Cristie Hem, PA-C  nystatin (MYCOSTATIN) 100000 UNIT/ML suspension Use 4-6 mL swish and swallow 4 times daily for 14 days Patient not taking: No sig reported 05/15/21   Unk Lightning, PA      [facility]  MD [facility] See Amion for pager If no response to pager, please call [phone] until 7pm After 7pm, Please call E-link [phone]

## 2021-05-26 NOTE — Progress Notes (Signed)
VASCULAR LAB    Carotid duplex has been performed.  See CV proc for preliminary results.   Mariavictoria Nottingham, RVT 05/26/2021, 10:47 AM

## 2021-05-26 NOTE — Evaluation (Signed)
Occupational Therapy Evaluation Patient Details Name: Sherry [name] MRN: [id number] DOB: 08-03-1919 Today's Date: 05/26/2021   History of Present Illness 87 yo female presenitng to ED on 11/5 with R sided weakness and speech difficulty. 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 PMH including a-fib, arthritis, HTN, OA, and pressure ulcer of sacral region.   Clinical Impression   PTA, pt was living with her daughters and has aide and family assist with bathing/dressing; uses RW and family supervises mobility. Pt presenting with R inattention, decreased functional use of RUE (dominant hand), poor balance, weakness, and decreased cognition. Pt very agreeable to therapy and OOB activity. Due to pt's PLOF, good family support, and motivation, recommend dc to CIR for intensive OT to optimize safety and occupational performance before return to home with family. Will continue to follow acutely as admitted to facilitate safe dc.       Recommendations for follow up therapy are one component of a multi-disciplinary discharge planning process, led by the attending physician.  Recommendations may be updated based on patient status, additional functional criteria and insurance authorization.   Follow Up Recommendations  Acute inpatient rehab (3hours/day)    Assistance Recommended at Discharge Frequent or constant Supervision/Assistance  Functional Status Assessment  Patient has had a recent decline in their functional status and demonstrates the ability to make significant improvements in function in a reasonable and predictable amount of time.  Equipment Recommendations  None recommended by OT    Recommendations for Other Services Rehab consult;PT consult;Speech consult     Precautions / Restrictions Precautions Precautions: Fall Restrictions Weight Bearing Restrictions: No      Mobility Bed Mobility Overal bed mobility: Needs Assistance Bed Mobility: Supine to Sit     Supine  to sit: Max assist;+2 for safety/equipment;+2 for physical assistance     General bed mobility comments: Max +2 for trunk elevation, LE lowering over EOB, and scooting to EOB with use of bed pad.    Transfers Overall transfer level: Needs assistance Equipment used: 2 person hand held assist Transfers: Bed to chair/wheelchair/BSC     Squat pivot transfers: Max assist;+2 physical assistance       General transfer comment: max +2 for initial power up and rise, pt not able to progress to full standing so squat pivot to recliner on L initiated. Assist for RLE guarding, hip translation, boost back in chair.      Balance Overall balance assessment: Needs assistance Sitting-balance support: Bilateral upper extremity supported;Feet supported Sitting balance-Leahy Scale: Poor Sitting balance - Comments: preference for posterior leaning Postural control: Posterior lean Standing balance support: Bilateral upper extremity supported Standing balance-Leahy Scale: Zero Standing balance comment: max +2                           ADL either performed or assessed with clinical judgement   ADL                                               Vision   Vision Assessment?: Yes;Vision impaired- to be further tested in functional context Additional Comments: Unable to track to R. Noting inattention to R.     Perception     Praxis      Pertinent Vitals/Pain Pain Assessment: No/denies pain     Hand Dominance Right  Extremity/Trunk Assessment Upper Extremity Assessment Upper Extremity Assessment: Overall WFL for tasks assessed   Lower Extremity Assessment Lower Extremity Assessment: Generalized weakness   Cervical / Trunk Assessment Cervical / Trunk Assessment: Kyphotic   Communication Communication Communication: Receptive difficulties;Expressive difficulties   Cognition Arousal/Alertness: Awake/alert Behavior During Therapy: Flat affect Overall  Cognitive Status: Impaired/Different from baseline Area of Impairment: Following commands;Problem solving;Attention                   Current Attention Level: Sustained   Following Commands: Follows one step commands with increased time     Problem Solving: Slow processing;Decreased initiation;Difficulty sequencing;Requires tactile cues General Comments: pt asks multiple times "so what are we doing?" during mobility tasks after cues have already been administered. Pt is difficult to understand at times due to no dentures in place. Pt requires step-by-step cues for sequencing tasks.     General Comments  BP 171/90s sitting EOB, RN aware and pt with permissive HTN SBP up to 180s    Exercises     Shoulder Instructions      Home Living Family/patient expects to be discharged to:: Private residence Living Arrangements: Children Available Help at Discharge: Family;Available 24 hours/day Type of Home: House Home Access: Ramped entrance     Home Layout: One level     Bathroom Shower/Tub: Producer, television/film/video: Standard     Home Equipment: Agricultural consultant (2 wheels);BSC/3in1;Wheelchair - manual;Grab bars - tub/shower;Grab bars - toilet   Additional Comments: Aide comes mon, wednesday, friday for three hours      Prior Functioning/Environment Prior Level of Function : Needs assist       Physical Assist : Mobility (physical);ADLs (physical) Mobility (physical): Gait ADLs (physical): Bathing;Dressing Mobility Comments: pt was able to ambulate with her RW around her home with PT or family behind her for safety ADLs Comments: Aide and family assist with bathing and dressing        OT Problem List: Decreased strength;Decreased range of motion;Decreased activity tolerance;Impaired balance (sitting and/or standing);Decreased knowledge of use of DME or AE;Decreased knowledge of precautions;Decreased safety awareness;Decreased cognition      OT  Treatment/Interventions: Self-care/ADL training;Therapeutic exercise;Energy conservation;DME and/or AE instruction    OT Goals(Current goals can be found in the care plan section) Acute Rehab OT Goals Patient Stated Goal: "Im cold" OT Goal Formulation: With patient Time For Goal Achievement: 06/09/21 Potential to Achieve Goals: Good  OT Frequency: Min 2X/week   Barriers to D/C:            Co-evaluation   Reason for Co-Treatment: For patient/therapist safety;To address functional/ADL transfers PT goals addressed during session: Mobility/safety with mobility;Balance        AM-PAC OT "6 Clicks" Daily Activity     Outcome Measure Help from another person eating meals?: A Lot Help from another person taking care of personal grooming?: A Lot Help from another person toileting, which includes using toliet, bedpan, or urinal?: A Lot Help from another person bathing (including washing, rinsing, drying)?: A Lot Help from another person to put on and taking off regular upper body clothing?: A Lot Help from another person to put on and taking off regular lower body clothing?: A Lot 6 Click Score: 12   End of Session Equipment Utilized During Treatment: Gait belt Nurse Communication: Mobility status  Activity Tolerance: Patient tolerated treatment well Patient left: in chair;with call bell/phone within reach;with chair alarm set;with family/visitor present  OT Visit Diagnosis: Unsteadiness on feet (R26.81);Other abnormalities  of gait and mobility (R26.89);Muscle weakness (generalized) (M62.81);Pain                Time: 1210-1239 OT Time Calculation (min): 29 min Charges:  OT General Charges $OT Visit: 1 Visit OT Evaluation $OT Eval Moderate Complexity: 1 Mod  Randall Colden MSOT, OTR/L Acute Rehab Pager: [phone] Office: [phone]  Theodoro Grist Saory Carriero 05/26/2021, 2:49 PM

## 2021-05-26 NOTE — Progress Notes (Signed)
Physical Therapy Treatment Patient Details Name: Sherry [name] MRN: [id number] DOB: 05/25/1920 Today's Date: 05/26/2021   History of Present Illness 87 yo female presenitng to ED on 11/5 with R sided weakness and speech difficulty. 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 PMH including a-fib, arthritis, HTN, OA, and pressure ulcer of sacral region.    PT Comments    Pt resting in bed upon PT and OT arrival, seen together to progress pt mobility safely s/p CVA. Pt currently requiring max +2 assist for bed mobility and transfer OOB, pt with fear of falling and difficulty achieving upright standing secondary to weakness. At baseline, pt has supervision level of assist and assist for ADLs, pt is far from baseline. Per chart review and RN, part of pt's family wants pt to d/c home but pt only has aide and RN assist at most 2 hours/day. Pt not currently at a functional level to return home to PLOF with this level of assist (family is with pt but could not provide significant physical assist). Pt's HCPOA daughter Sherry [name] is interested in CIR consult, d/c recommendations updated accordingly.     Recommendations for follow up therapy are one component of a multi-disciplinary discharge planning process, led by the attending physician.  Recommendations may be updated based on patient status, additional functional criteria and insurance authorization.  Follow Up Recommendations  Acute inpatient rehab (3hours/day)     Assistance Recommended at Discharge Frequent or constant Supervision/Assistance  Equipment Recommendations  None recommended by PT    Recommendations for Other Services       Precautions / Restrictions Precautions Precautions: Fall Restrictions Weight Bearing Restrictions: No     Mobility  Bed Mobility Overal bed mobility: Needs Assistance Bed Mobility: Supine to Sit     Supine to sit: +2 for physical assistance;Max assist     General bed mobility comments: Max  +2 for trunk elevation, LE lowering over EOB, and scooting to EOB with use of bed pad.    Transfers Overall transfer level: Needs assistance Equipment used: 2 person hand held assist Transfers: Bed to chair/wheelchair/BSC     Squat pivot transfers: Max assist;+2 physical assistance       General transfer comment: max +2 for initial power up and rise, pt not able to progress to full standing so squat pivot to recliner on L initiated. Assist for RLE guarding, hip translation, boost back in chair.    Ambulation/Gait               General Gait Details: unable today   Stairs             Wheelchair Mobility    Modified Rankin (Stroke Patients Only) Modified Rankin (Stroke Patients Only) Pre-Morbid Rankin Score: No symptoms Modified Rankin: Severe disability     Balance Overall balance assessment: Needs assistance Sitting-balance support: Bilateral upper extremity supported;Feet supported Sitting balance-Leahy Scale: Poor Sitting balance - Comments: preference for posterior leaning Postural control: Posterior lean Standing balance support: Bilateral upper extremity supported Standing balance-Leahy Scale: Zero Standing balance comment: max +2                            Cognition Arousal/Alertness: Awake/alert Behavior During Therapy: Flat affect Overall Cognitive Status: Impaired/Different from baseline Area of Impairment: Following commands;Problem solving;Attention                   Current Attention Level: Sustained   Following  Commands: Follows one step commands with increased time     Problem Solving: Slow processing;Decreased initiation;Difficulty sequencing;Requires tactile cues General Comments: pt asks multiple times "so what are we doing?" during mobility tasks after cues have already been administered. Pt is difficult to understand at times due to no dentures in place. Pt requires step-by-step cues for sequencing tasks.         Exercises      General Comments General comments (skin integrity, edema, etc.): BP 171/90s sitting EOB, RN aware and pt with permissive HTN SBP up to 180s      Pertinent Vitals/Pain Pain Assessment: No/denies pain    Home Living Family/patient expects to be discharged to:: Private residence Living Arrangements: Children Available Help at Discharge: Family;Available 24 hours/day Type of Home: House Home Access: Ramped entrance       Home Layout: One level Home Equipment: Conservation officer, nature (2 wheels);BSC/3in1;Wheelchair - manual;Grab bars - tub/shower;Grab bars - toilet Additional Comments: Aide comes mon, wednesday, friday for three hours    Prior Function            PT Goals (current goals can now be found in the care plan section) Acute Rehab PT Goals Patient Stated Goal: son reports pt's family wants her to come home regardless of functional status and they will care for her; HCPOA daughter interested in CIR PT Goal Formulation: With family Time For Goal Achievement: 06/08/21 Potential to Achieve Goals: Fair Progress towards PT goals: Progressing toward goals    Frequency    Min 4X/week      PT Plan Discharge plan needs to be updated    Co-evaluation PT/OT/SLP Co-Evaluation/Treatment: Yes Reason for Co-Treatment: For patient/therapist safety;To address functional/ADL transfers PT goals addressed during session: Mobility/safety with mobility;Balance        AM-PAC PT "6 Clicks" Mobility   Outcome Measure  Help needed turning from your back to your side while in a flat bed without using bedrails?: A Lot Help needed moving from lying on your back to sitting on the side of a flat bed without using bedrails?: Total Help needed moving to and from a bed to a chair (including a wheelchair)?: Total Help needed standing up from a chair using your arms (e.g., wheelchair or bedside chair)?: Total Help needed to walk in hospital room?: Total Help needed climbing  3-5 steps with a railing? : Total 6 Click Score: 7    End of Session Equipment Utilized During Treatment: Gait belt Activity Tolerance: Patient limited by fatigue Patient left: with call bell/phone within reach;with family/visitor present;in chair;with chair alarm set Nurse Communication: Mobility status PT Visit Diagnosis: Muscle weakness (generalized) (M62.81);Unsteadiness on feet (R26.81);Difficulty in walking, not elsewhere classified (R26.2)     Time: [id number] PT Time Calculation (min) (ACUTE ONLY): 29 min  Charges:  $Therapeutic Activity: 8-22 mins                    Stacie Glaze, PT DPT Acute Rehabilitation Services Pager [phone]  Office [phone]    Roxine Caddy E Ruffin Pyo 05/26/2021, 2:08 PM

## 2021-05-26 NOTE — Progress Notes (Addendum)
STROKE TEAM PROGRESS NOTE   INTERVAL HISTORY Patient is sitting up in bed in NAD. Family arrived after rounds and was updated by Dr. Leonie Man. She is aphasic, expressive>receptive. Can follow one step commands. No new neurological events overnight. Can transfer to the floor.  Patient developed A. fib/a flutter after rounds And will ask pulmonary critical care to help manage.   Her daughter arrived after rounds Vitals:   05/26/21 0300 05/26/21 0400 05/26/21 0800 05/26/21 1200  BP: (!) 161/81 (!) 150/94    Pulse: (!) 104 93    Resp: (!) 25 14    Temp:  98.7 F (37.1 C) 99.3 F (37.4 C) 98.9 F (37.2 C)  TempSrc:  Oral Oral Axillary  SpO2: 98% 97%     CBC:  Recent Labs  Lab 05/24/21 0715 05/24/21 0720 05/25/21 0949 05/26/21 0219  WBC 4.4  --  5.1 5.7  NEUTROABS 2.7  --   --   --   HGB 13.2   < > 12.6 11.9*  HCT 42.7   < > 39.4 35.6*  MCV 87.1  --  84.2 82.6  PLT 209  --  156 232   < > = values in this interval not displayed.    Basic Metabolic Panel:  Recent Labs  Lab 05/25/21 0949 05/26/21 0219  NA 138 136  K 4.5 3.6  CL 102 104  CO2 23 24  GLUCOSE 71 116*  BUN 10 13  CREATININE 1.01* 0.93  CALCIUM 9.4 8.9    Lipid Panel:  Recent Labs  Lab 05/24/21 0715  CHOL 208*  TRIG 100  HDL 53  CHOLHDL 3.9  VLDL 20  LDLCALC 135*    HgbA1c:  Recent Labs  Lab 05/24/21 0715  HGBA1C 5.3    Urine Drug Screen: No results for input(s): LABOPIA, COCAINSCRNUR, LABBENZ, AMPHETMU, THCU, LABBARB in the last 168 hours.  Alcohol Level No results for input(s): ETH in the last 168 hours.  IMAGING past 24 hours No results found.  PHYSICAL EXAM General: Patient is a well-developed female in no acute distress.   NEURO:  Mental Status: Awake and alert Speech/Language: speech is garbled. She has expressive and receptive aphasia. Can follow one step commands.  Difficulty with naming repetition  Cranial Nerves:  II: PERRL. Visual fields full.  III, IV, VI: EOMI. Eyelids  elevate symmetrically.  V: Sensation is intact to light touch and symmetrical to face.  VII: Right sided facial droop  VIII: hearing intact to voice. IX, X: Palate elevates symmetrically. Phonation is normal.  [id number] shrug 5/5. XII: tongue is midline without fasciculations. Motor: Right sided weakness present right upper extremity 3/5 right lower extremity 4/5 strength with weakness of right grip and intrinsic hand muscles. Tone: is normal and bulk is normal Sensation- Decreased on right side Coordination: Unable to test  Gait- deferred     ASSESSMENT/PLAN Ms. MAKINSEY JUELFS is a 87 y.o. female with history of HTN, HLD and atrial fibrillation not on anticoagulation presenting with right sided weakness.  TNK was administered in the ED with some improvement of symptoms.   Stroke:  left MCA infarct embolic from A. fib/flutter s/p IV TNK administration Code Stroke CT head No acute infarct or hemorrhage . ASPECTS 10.  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 Yes; Fluid consistency: Thin   aspirin 81 mg daily prior to admission, now on aspirin 81 mg daily and clopidogrel 75 mg daily. For 3 months Therapy recommendations:  Home Health PT, OT pending Disposition:  pending  Hypertension Home meds:  none Stable Permissive hypertension (OK if <180/105) but gradually normalize in 5-7 days Long-term BP goal normotensive  Hyperlipidemia Home meds:  none LDL 135, goal < 70 High intensity statin deferred due to advanced age Continue statin at discharge  Atrial fibrillation Patient was not  taking anticoagulation at home, likely due to advanced age Will discuss risks and benefits of anticoagulation with patient's family  Bilateral ICA stenosis Left ICA origin stenosis approaching radiographic string sign, right ICA bulb 65-70% stenosis seen on CTA head and neck Benefits of intervention unclear given patient's advanced age Check carotid US today   Other Stroke Risk Factors Advanced Age >/= 20   Other Active Problems Healed pressure injury noted to sacrum Continue offloading and routine skin protection measures  Hospital day # 2  Dot Lanes, NP  I have personally obtained history,examined this patient, reviewed notes, independently viewed imaging studies, participated in medical decision making and plan of care.ROS completed by me personally and pertinent positives fully documented  I have made any additions or clarifications directly to the above note. Agree with note above.  Patient presented with aphasia and right hemiparesis and received thrombolysis with IV TNK and has shown mild improvement yet.  Continue close blood pressure monitoring as per postthrombolytic protocol and strict blood pressure control.  Mobilize out of bed.  Therapy consults.  Patient may not be a good candidate for long-term anticoagulation given her advanced age.  Start aspirin.  Long discussion with patient's daughter at bedside and answered questions.  Discussed with Dr. Merrily Pew critical care medicine.This patient is critically ill and at significant risk of neurological worsening, death and care requires constant monitoring of vital signs, hemodynamics,respiratory and cardiac monitoring, extensive review of multiple databases, frequent neurological assessment, discussion with family, other specialists and medical decision making of high complexity.I have made any additions or clarifications directly to the above note.This critical care time does not reflect procedure time, or teaching time or  supervisory time of PA/NP/Med Resident etc but could involve care discussion time.  I spent 30 minutes of neurocritical care time  in the care of  this patient.      Delia Heady, MD Medical Director [facility] Pager: [phone] 05/26/2021 4:29 PM  To contact Stroke Continuity provider, please refer to [email or web address]. After hours, contact General Neurology

## 2021-05-26 NOTE — Progress Notes (Signed)
Inpatient Rehab Admissions Coordinator Note:   Per PT/OT patient was screened for CIR candidacy by Cera Rorke Luvenia Starch, CCC-SLP. At this time, pt appears to be a potential candidate for CIR. I will place an order for rehab consult for full assessment, per our protocol.  Please contact me any with questions.Wolfgang Phoenix, MS, CCC-SLP Admissions Coordinator [phone] 05/26/21 6:51 PM

## 2021-05-27 DIAGNOSIS — I6300 Cerebral infarction due to thrombosis of unspecified precerebral artery: Secondary | ICD-10-CM | POA: Diagnosis not present

## 2021-05-27 DIAGNOSIS — I63312 Cerebral infarction due to thrombosis of left middle cerebral artery: Secondary | ICD-10-CM

## 2021-05-27 LAB — CBC
HCT: 36.7 % (ref 36.0–46.0)
Hemoglobin: 11.8 g/dL — ABNORMAL LOW (ref 12.0–15.0)
MCH: 26.9 pg (ref 26.0–34.0)
MCHC: 32.2 g/dL (ref 30.0–36.0)
MCV: 83.8 fL (ref 80.0–100.0)
Platelets: 223 10*3/uL (ref 150–400)
RBC: 4.38 MIL/uL (ref 3.87–5.11)
RDW: 16.1 % — ABNORMAL HIGH (ref 11.5–15.5)
WBC: 6.3 10*3/uL (ref 4.0–10.5)
nRBC: 0 % (ref 0.0–0.2)

## 2021-05-27 LAB — ECHOCARDIOGRAM COMPLETE
AR max vel: 1.01 cm2
AV Area VTI: 0.97 cm2
AV Area mean vel: 0.96 cm2
AV Mean grad: 19 mmHg
AV Peak grad: 34.1 mmHg
Ao pk vel: 2.92 m/s
Area-P 1/2: 3.6 cm2
MV M vel: 5.76 m/s
MV Peak grad: 132.7 mmHg
P 1/2 time: 420 msec
Radius: 0.3 cm
S' Lateral: 2.4 cm

## 2021-05-27 LAB — BASIC METABOLIC PANEL
Anion gap: 10 (ref 5–15)
BUN: 11 mg/dL (ref 8–23)
CO2: 21 mmol/L — ABNORMAL LOW (ref 22–32)
Calcium: 8.9 mg/dL (ref 8.9–10.3)
Chloride: 103 mmol/L (ref 98–111)
Creatinine, Ser: 0.95 mg/dL (ref 0.44–1.00)
GFR, Estimated: 54 mL/min — ABNORMAL LOW (ref >60)
Glucose, Bld: 114 mg/dL — ABNORMAL HIGH (ref 70–99)
Potassium: 3.3 mmol/L — ABNORMAL LOW (ref 3.5–5.1)
Sodium: 134 mmol/L — ABNORMAL LOW (ref 135–145)

## 2021-05-27 NOTE — PMR Pre-admission (Signed)
PMR Admission Coordinator Pre-Admission Assessment  Patient: Sherry [name] is an 87 y.o., female MRN: [id number] DOB: March 25, 1920 Height:   Weight:    Insurance Information HMO:     PPO:      PCP:      IPA:      80/20:      OTHER:  PRIMARY: medicare a/b      Policy#: [id number]      Subscriber: pt CM Name:       Phone#:      Fax#:  Pre-Cert#: verified Civil engineer, contracting:  Benefits:  Phone #:      Name:  Eff. Date: A 09/18/91, B 07/20/88     Deduct: $1556      Out of Pocket Max: n/a      Life Max: n/a CIR: 100%      SNF: 20 full days Outpatient: 80%     Co-Ins: 20% Home Health: 100%      Co-Pay DME: 80%     Co-Ins: 20% Providers:  SECONDARY: Medicaid      Policy#: [id number]     Phone#: [phone]  Financial Counselor:       Phone#:   The "Data Collection Information Summary" for patients in Inpatient Rehabilitation Facilities with attached "Privacy Act [place] Records" was provided and verbally reviewed with: Patient  Emergency Contact Information Contact Information     Name Relation Home Work Mobile   [place] Daughter [phone]     Jessieca, Rhem   [phone]   Deberry,Shirleene Other [phone]         Current Medical History  Patient Admitting Diagnosis: CVA  History of Present Illness: Pt is a 87 y/o female with history of HTN and atrial fib.  Presented to [facility] on 11/5 with R sided weakness. ED workup revealed L MCA likely due to afib/flutter.  CTA head/neck showed L ICA origin stenosis, R ICA bulb 65-70% stenosis.  Carotid US showed bilateral carotid stenosis 1-39%.  Permissive HTN normalize in 5-7 days.  Therapy evaluations completed and pt was recommended for CIR.   Complete NIHSS TOTAL: 15  Patient's medical record from [facility] has been reviewed by the rehabilitation admission coordinator and physician.  Past Medical History  Past Medical History:  Diagnosis Date   Acid reflux    Arthritis    Atrial fibrillation (HCC)     Chronic constipation    GERD (gastroesophageal reflux disease)    Hyperlipidemia    Hypertension    Osteoarthritis    Pressure ulcer of sacral region     Has the patient had major surgery during 100 days prior to admission? No  Family History   family history includes Hypertension in her mother; Other in her brother.  Current Medications  Current Facility-Administered Medications:    0.9 %  sodium chloride infusion, 50 mL/hr, Intravenous, Continuous, Greta Doom, MD, Last Rate: 50 mL/hr at 05/27/21 0157, 50 mL/hr at 05/27/21 0157   acetaminophen (TYLENOL) tablet 650 mg, 650 mg, Oral, Q4H PRN, 650 mg at 05/27/21 0658 **OR** acetaminophen (TYLENOL) 160 MG/5ML solution 650 mg, 650 mg, Per Tube, Q4H PRN **OR** acetaminophen (TYLENOL) suppository 650 mg, 650 mg, Rectal, Q4H PRN, Greta Doom, MD   acetaminophen-codeine (TYLENOL #3) 300-30 MG per tablet 0.5 tablet, 0.5 tablet, Oral, QHS, Leonie Man, Pramod S, MD, 0.5 tablet at 05/26/21 2016   aspirin chewable tablet 81 mg, 81 mg, Oral, Daily, de Yolanda Manges, Prospect E, NP, 81 mg at 05/27/21  0914   chlorhexidine (PERIDEX) 0.12 % solution 15 mL, 15 mL, Mouth Rinse, BID, Greta Doom, MD, 15 mL at 05/27/21 5830   clopidogrel (PLAVIX) tablet 75 mg, 75 mg, Oral, Daily, Beulah Gandy A, NP, 75 mg at 05/27/21 0914   diltiazem (CARDIZEM) tablet 30 mg, 30 mg, Oral, Q6H, Chand, Sudham, MD, 30 mg at 05/27/21 1219   enoxaparin (LOVENOX) injection 40 mg, 40 mg, Subcutaneous, q1800, Chand, Currie Paris, MD, 40 mg at 05/26/21 1825   labetalol (NORMODYNE) injection 10 mg, 10 mg, Intravenous, Q2H PRN, de Yolanda Manges, Cortney E, NP   MEDLINE mouth rinse, 15 mL, Mouth Rinse, q12n4p, Greta Doom, MD, 15 mL at 05/26/21 1158   [DISCONTINUED] labetalol (NORMODYNE) injection 10 mg, 10 mg, Intravenous, Once PRN **AND** nicardipine (CARDENE) 15m in 0.86% saline 2065mIV infusion (0.1 mg/ml), 0-15 mg/hr, Intravenous, Continuous PRN, KiGreta DoomMD   pantoprazole (PROTONIX) EC tablet 40 mg, 40 mg, Oral, QHS, Sethi, Pramod S, MD, 40 mg at 05/26/21 2135   sodium chloride flush (NS) 0.9 % injection 3 mL, 3 mL, Intravenous, Once, Rancour, Stephen, MD  Patients Current Diet:  Diet Order             DIET - DYS 1 Room service appropriate? Yes; Fluid consistency: Thin  Diet effective now                   Precautions / Restrictions Precautions Precautions: Fall Restrictions Weight Bearing Restrictions: No   Has the patient had 2 or more falls or a fall with injury in the past year? No  Prior Activity Level Household: has been essentially homebound, but recently got a ramp, limited community ambulation with RW  Prior Functional Level Self Care: Did the patient need help bathing, dressing, using the toilet or eating? Needed some help  Indoor Mobility: Did the patient need assistance with walking from room to room (with or without device)? Needed some help (supervision)  Stairs: Did the patient need assistance with internal or external stairs (with or without device)? Needed some help  Functional Cognition: Did the patient need help planning regular tasks such as shopping or remembering to take medications? Dependent  Patient Information Are you of Hispanic, Latino/a,or Spanish origin?: A. No, not of Hispanic, Latino/a, or Spanish origin What is your race?: B. Black or African American Do you need or want an interpreter to communicate with a doctor or health care staff?: 0. No  Patient's Response To:  Health Literacy and Transportation Is the patient able to respond to health literacy and transportation needs?: No Health Literacy - How often do you need to have someone help you when you read instructions, pamphlets, or other written material from your doctor or pharmacy?: Patient unable to respond In the past 12 months, has lack of transportation kept you from medical appointments or from getting medications?:  No In the past 12 months, has lack of transportation kept you from meetings, work, or from getting things needed for daily living?: No  Home Assistive Devices / Equipment Home Equipment: RoConservation officer, nature2 wheels), BSC/3in1, Wheelchair - manual, Grab bars - tub/shower, Grab bars - toilet  Prior Device Use: Indicate devices/aids used by the patient prior to current illness, exacerbation or injury? Walker  Current Functional Level Cognition  Arousal/Alertness: Awake/alert Overall Cognitive Status: Impaired/Different from baseline Difficult to assess due to: Impaired communication Current Attention Level: Sustained Orientation Level: Oriented X4 Following Commands: Follows one step commands with increased time General  Comments: pt asks multiple times "so what are we doing?" during mobility tasks after cues have already been administered. Pt is difficult to understand at times due to no dentures in place. Pt requires step-by-step cues for sequencing tasks. Behaviors: Perseveration    Extremity Assessment (includes Sensation/Coordination)  Upper Extremity Assessment: Overall WFL for tasks assessed  Lower Extremity Assessment: Generalized weakness    ADLs       Mobility  Overal bed mobility: Needs Assistance Bed Mobility: Supine to Sit Supine to sit: Max assist, +2 for safety/equipment, +2 for physical assistance Sit to supine: Max assist, +2 for physical assistance General bed mobility comments: Max +2 for trunk elevation, LE lowering over EOB, and scooting to EOB with use of bed pad.    Transfers  Overall transfer level: Needs assistance Equipment used: 2 person hand held assist Transfers: Bed to chair/wheelchair/BSC Sit to Stand: Max assist Bed to/from chair/wheelchair/BSC transfer type:: Squat pivot Squat pivot transfers: Max assist, +2 physical assistance General transfer comment: max +2 for initial power up and rise, pt not able to progress to full standing so squat pivot to  recliner on L initiated. Assist for RLE guarding, hip translation, boost back in chair.    Ambulation / Gait / Stairs / Wheelchair Mobility  Ambulation/Gait General Gait Details: unable today    Posture / Balance Dynamic Sitting Balance Sitting balance - Comments: preference for posterior leaning Balance Overall balance assessment: Needs assistance Sitting-balance support: Bilateral upper extremity supported, Feet supported Sitting balance-Leahy Scale: Poor Sitting balance - Comments: preference for posterior leaning Postural control: Posterior lean Standing balance support: Bilateral upper extremity supported Standing balance-Leahy Scale: Zero Standing balance comment: max +2    Special needs/care consideration N/a   Previous Home Environment (from acute therapy documentation) Living Arrangements: Children  Lives With: Son, Daughter Available Help at Discharge: Family, Available 24 hours/day Type of Home: House Home Layout: One level Home Access: Ramped entrance Bathroom Shower/Tub: Multimedia programmer: Standard Additional Comments: Aide comes mon, wednesday, friday for three hours  Discharge Living Setting Plans for Discharge Living Setting: Lives with (comment) (daughter;son) Type of Home at Discharge: House Discharge Home Layout: One level Discharge Home Access: [place] entrance Discharge Bathroom Shower/Tub: Walk-in shower Discharge Bathroom Toilet: Standard Discharge Bathroom Accessibility: Yes How Accessible: Accessible via walker Does the patient have any problems obtaining your medications?: No  Social/Family/Support Systems Anticipated Caregiver: daughter, Angie Anticipated Caregiver's Contact Information: [phone] Ability/Limitations of Caregiver: can provide mod assist for ADLs and min assist for mobility Caregiver Availability: 24/7 Discharge Plan Discussed with Primary Caregiver: Yes Is Caregiver In Agreement with Plan?: Yes Does  Caregiver/Family have Issues with Lodging/Transportation while Pt is in Rehab?: No  Goals Patient/Family Goal for Rehab: PT/OT min assist, SLP min to mod assist Expected length of stay: 14-18 days Pt/Family Agrees to Admission and willing to participate: Yes Program Orientation Provided & Reviewed with Pt/Caregiver Including Roles  & Responsibilities: Yes  Barriers to Discharge: Decreased caregiver support (if fails to progress to a level family can provide)  Decrease burden of Care through IP rehab admission: n/a  Possible need for SNF placement upon discharge: Not anticipated, but possible if patient does reach a level family can support at home.   Patient Condition: I have reviewed medical records from [facility], spoken with CM, and patient, son, and daughter. I met with patient at the bedside for inpatient rehabilitation assessment.  Patient will benefit from ongoing PT, OT, and SLP, can actively participate in 3  hours of therapy a day 5 days of the week, and can make measurable gains during the admission.  Patient will also benefit from the coordinated team approach during an Inpatient Acute Rehabilitation admission.  The patient will receive intensive therapy as well as Rehabilitation physician, nursing, social worker, and care management interventions.  Due to safety, skin/wound care, disease management, medication administration, pain management, and patient education the patient requires 24 hour a day rehabilitation nursing.  The patient is currently max +1 with mobility and basic ADLs.  Discharge setting and therapy post discharge at home with home health is anticipated.  Patient has agreed to participate in the Acute Inpatient Rehabilitation Program and will admit today.  Preadmission Screen Completed By:  Michel Santee, PT, DPT 05/27/2021 1:02 PM ______________________________________________________________________   Discussed status with Dr. Ranell Patrick on 05/28/21  at 10:17 AM  and  received approval for admission today.  Admission Coordinator:  Michel Santee, PT, DPT time 10:17 AM Sudie Grumbling 05/28/21    Assessment/Plan: Diagnosis: CVA Does the need for close, 24 hr/day Medical supervision in concert with the patient's rehab needs make it unreasonable for this patient to be served in a less intensive setting? Yes Co-Morbidities requiring supervision/potential complications: atrial fibrillation, pelvic pain, pressure injury, slow transit constipation, hypokalemia Due to bladder management, bowel management, safety, skin/wound care, disease management, medication administration, pain management, and patient education, does the patient require 24 hr/day rehab nursing? Yes Does the patient require coordinated care of a physician, rehab nurse, PT, OT, and SLP to address physical and functional deficits in the context of the above medical diagnosis(es)? Yes Addressing deficits in the following areas: balance, endurance, locomotion, strength, transferring, bowel/bladder control, bathing, dressing, feeding, grooming, toileting, swallowing, and psychosocial support Can the patient actively participate in an intensive therapy program of at least 3 hrs of therapy 5 days a week? Yes The potential for patient to make measurable gains while on inpatient rehab is excellent Anticipated functional outcomes upon discharge from inpatient rehab: min assist PT, min assist OT, min assist SLP Estimated rehab length of stay to reach the above functional goals is: 2-3 weeks Anticipated discharge destination: Home 10. Overall Rehab/Functional Prognosis: excellent   MD Signature: Leeroy Cha, MD

## 2021-05-27 NOTE — Progress Notes (Signed)
Inpatient Rehab Admissions Coordinator:   Met with patient and her family (daughter and son) at the bedside to discuss rehab recommendations.  We discussed average length of stay to be about 2 weeks, with physician f/u and 3 hrs/day of therapy.  Pt has good family support at home, as well as hired caregivers for several hours/week for bathing.  Goals would be supervision to min assist (potentially very limited ambulation, depending on progress).  I will follow for potential admission pending bed availability.     , PT, DPT Admissions Coordinator [phone] 05/27/21  12:58 PM  

## 2021-05-27 NOTE — Progress Notes (Signed)
Speech Language Pathology Treatment: Dysphagia;Cognitive-Linquistic  Patient Details Name: Sherry [name] MRN: [id number] DOB: [birth year] Today's Date: 05/27/2021 Time: 0350-0938 SLP Time Calculation (min) (ACUTE ONLY): 25 min  Assessment / Plan / Recommendation Clinical Impression  Pt seen at bedside for skilled ST intervention targeting goals for diet tolerance/education and cognitive linguistic function. 2 family members at bedside during this session. Pt was awake, alert, and cooperative during this session. RN was present providing meds crushed in puree, which pt tolerated well. Family reports pt took meds whole prior to admit. Pt then accepted trials of thin liquid via straw, and puree. Minimal and intermittent anterior leakage noted with straw sips. No overt s/s aspiration observed on either texture. Good oral clearing noted.  During this session, pt was more intelligible than on initial evaluation. Occasional repetition needed. She was able to answer yes/no questions accurately, follow verbal directions more consistently (repetition needed occasionally, possibly due to poor hearing acuity), and indicate wants and needs verbally. She engaged with a friend on the telephone appropriately.   Plan is for transfer to CIR for continued rehab. SLP will continue to follow acutely for cognitive-linguistic therapy. Signing off for swallowing needs.     HPI HPI: Sherry [name] is a 87 y.o. female with a history of hypertension, hyperlipidemia, atrial fibrillation not on anticoagulation who presented to the ED with right-sided weakness that started this morning.   She was in the bathroom when her daughter found her slumped toward the right and 911 was called.  CT of the head was showing no acute cortically based infarct or acute intracranial hemorrhage.  She is s/p IV thrombolytic therapy.      SLP Plan  Continue with current plan of care      Recommendations for follow up therapy are one  component of a multi-disciplinary discharge planning process, led by the attending physician.  Recommendations may be updated based on patient status, additional functional criteria and insurance authorization.    Recommendations  Diet recommendations: Dysphagia 1 (puree);Thin liquid Liquids provided via: Straw Medication Administration: Whole meds with liquid Supervision: Staff to assist with self feeding;Full supervision/cueing for compensatory strategies Compensations: Slow rate;Small sips/bites;Follow solids with liquid;Monitor for anterior loss Postural Changes and/or Swallow Maneuvers: Seated upright 90 degrees;Upright 30-60 min after meal                Oral Care Recommendations: Oral care BID Follow Up Recommendations: Acute inpatient rehab (3hours/day) SLP Visit Diagnosis: Dysphagia, oral phase (R13.11);Aphasia (R47.01);Dysarthria and anarthria (R47.1) Plan: Continue with current plan of care       GO               Lafaye Mcelmurry B. Murvin Natal, [facility], CCC-SLP Speech Language Pathologist Office: [phone]  Leigh Aurora  05/27/2021, 12:42 PM

## 2021-05-27 NOTE — Care Management Important Message (Signed)
Important Message  Patient Details  Name: Sherry [name] MRN: [id number] Date of Birth: [birth year]   Medicare Important Message Given:  Yes     Chosen Geske Stefan Church 05/27/2021, 10:33 AM

## 2021-05-27 NOTE — Care Management Important Message (Signed)
Important Message  Patient Details  Name: [name] MRN: [id number] Date of Birth: 02-28-1920   Medicare Important Message Given:  Yes     Yancy Hascall Stefan Church 05/27/2021, 2:33 PM

## 2021-05-27 NOTE — Progress Notes (Signed)
STROKE TEAM PROGRESS NOTE   INTERVAL HISTORY Patient is sitting up in bed in NAD. Marland Kitchen  Her daughter and son are at the bedside..Her aphasia seems to be improving and she is able to speak short sentences and follow commands better today.  No new neurological events overnight.  Her heart rate is well controlled. Vitals:   05/27/21 0350 05/27/21 0400 05/27/21 0803 05/27/21 1236  BP: 120/66 114/70 125/65 123/77  Pulse: 82 83 85 74  Resp: 19 18 20 18   Temp: (!) 97.4 F (36.3 C) 97.9 F (36.6 C) 97.6 F (36.4 C) (!) 97.3 F (36.3 C)  TempSrc: Axillary Axillary Oral Oral  SpO2: 98% 96% 98% 99%   CBC:  Recent Labs  Lab 05/24/21 0715 05/24/21 0720 05/26/21 0219 05/27/21 0226  WBC 4.4   < > 5.7 6.3  NEUTROABS 2.7  --   --   --   HGB 13.2   < > 11.9* 11.8*  HCT 42.7   < > 35.6* 36.7  MCV 87.1   < > 82.6 83.8  PLT 209   < > 232 223   < > = values in this interval not displayed.   Basic Metabolic Panel:  Recent Labs  Lab 05/26/21 0219 05/27/21 0226  NA 136 134*  K 3.6 3.3*  CL 104 103  CO2 24 21*  GLUCOSE 116* 114*  BUN 13 11  CREATININE 0.93 0.95  CALCIUM 8.9 8.9   Lipid Panel:  Recent Labs  Lab 05/24/21 0715  CHOL 208*  TRIG 100  HDL 53  CHOLHDL 3.9  VLDL 20  LDLCALC 13/05/22*   HgbA1c:  Recent Labs  Lab 05/24/21 0715  HGBA1C 5.3   Urine Drug Screen: No results for input(s): LABOPIA, COCAINSCRNUR, LABBENZ, AMPHETMU, THCU, LABBARB in the last 168 hours.  Alcohol Level No results for input(s): ETH in the last 168 hours.  IMAGING past 24 hours No results found.  PHYSICAL EXAM General: Patient is pleasant frail elderly African-American lady e in no acute distress.   NEURO:  Mental Status: Awake and alert Speech/Language: speech is nonfluent and slightly hesitant but she is able to speak sentences she has very mild expressive and receptive aphasia. Can follow one step commands.  Difficulty with naming repetition  Cranial Nerves:  II: PERRL. Visual fields full.   III, IV, VI: EOMI. Eyelids elevate symmetrically.  V: Sensation is intact to light touch and symmetrical to face.  VII: Right sided facial droop  VIII: hearing intact to voice. IX, X: Palate elevates symmetrically. Phonation is normal.  13/05/22 shrug 5/5. XII: tongue is midline without fasciculations. Motor: Right sided weakness present right upper extremity 3/5 right lower extremity 4/5 strength with weakness of right grip and intrinsic hand muscles. Tone: is normal and bulk is normal Sensation- Decreased on right side Coordination: Unable to test  Gait- deferred     ASSESSMENT/PLAN [name] is a 87 y.o. female with history of HTN, HLD and atrial fibrillation not on anticoagulation presenting with right sided weakness.  TNK was administered in the ED with some improvement of symptoms.   Stroke:  left MCA infarct embolic from A. fib/flutter s/p IV TNK administration Code Stroke CT head No acute infarct or hemorrhage . ASPECTS 10.  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 Yes; Fluid consistency: Thin   aspirin 81 mg daily prior to admission, now on aspirin 81 mg daily and clopidogrel 75 mg daily. For 3 months Therapy recommendations:  Home Health PT, OT pending Disposition:  pending  Hypertension Home meds:  none Stable Permissive hypertension (OK if <180/105) but gradually normalize in 5-7 days Long-term BP goal normotensive  Hyperlipidemia Home meds:  none LDL 135, goal < 70 High intensity statin deferred due to advanced age Continue statin at discharge  Atrial  fibrillation Patient was not taking anticoagulation at home, likely due to advanced age Will discuss risks and benefits of anticoagulation with patient's family  Bilateral ICA stenosis Left ICA origin stenosis approaching radiographic string sign, right ICA bulb 65-70% stenosis seen on CTA head and neck that carotid ultrasound shows only 1-49% stenosis Benefits of intervention unclear given patient's advanced age Check carotid US today   Other Stroke Risk Factors Advanced Age >/= 71   Other Active Problems Healed pressure injury noted to sacrum Continue offloading and routine skin protection measures  Hospital day # 3  Patient presented with aphasia and right hemiparesis and received thrombolysis with IV TNK and has shown mild improvement yet.  Continue close blood pressure monitoring as per postthrombolytic protocol and strict blood pressure control.  Mobilize out of bed.  Therapy consults.  Patient may not be a good candidate for long-term anticoagulation given her advanced age.  Started aspirin.  Long discussion with patient's daughter at bedside and answered questions. .  Continue ongoing therapies.  Mobilize out of bed.  Transfer to rehab when bed available.  Greater than 50% time during this 25-minute visit was spent in counseling and coordination of care and discussion with care team and answering questions.      Antony Contras, MD Medical Director [facility] Pager: [phone] 05/27/2021 4:09 PM  To contact Stroke Continuity provider, please refer to [email or web address] After hours, contact General Neurology

## 2021-05-27 NOTE — Progress Notes (Signed)
Physical Therapy Treatment Patient Details Name: Sherry [name] MRN: [id number] DOB: July 26, 1919 Today's Date: 05/27/2021   History of Present Illness 87 yo female presenitng to ED on 11/5 with R sided weakness and speech difficulty. 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 PMH including a-fib, arthritis, HTN, OA, and pressure ulcer of sacral region.    PT Comments    Patient awake and alert throughout session. Required less physical assist with mobility this date (although still requires +2 assist due to tendency to lean posterior and her fear of falling). Used stedy lift for standing/transfer to try to reduce fear of falling, however pt's posterior lean and her tendency to move her feet even when told to keep them still, made the lift less efficient and effective than usual.    Recommendations for follow up therapy are one component of a multi-disciplinary discharge planning process, led by the attending physician.  Recommendations may be updated based on patient status, additional functional criteria and insurance authorization.  Follow Up Recommendations  Acute inpatient rehab (3hours/day)     Assistance Recommended at Discharge Frequent or constant Supervision/Assistance  Equipment Recommendations  None recommended by PT    Recommendations for Other Services OT consult     Precautions / Restrictions Precautions Precautions: Fall     Mobility  Bed Mobility Overal bed mobility: Needs Assistance Bed Mobility: Supine to Sit     Supine to sit: +2 for physical assistance;Mod assist     General bed mobility comments: HOB elevated, pt able to move legs toward EOB but needed assist to finish getting legs over EOB and to raise torso and scoot out to EOB    Transfers Overall transfer level: Needs assistance   Transfers: Bed to chair/wheelchair/BSC Sit to Stand: Mod assist;+2 physical assistance Stand pivot transfers:  (via stedy)         General transfer  comment: stood from elevated EOB with +2 mod; pt with difficulty sitting on seat as her left foot kept sliding forward and 1/2 way off platform; stood again from stedy and sat in Patent examiner via Lift Equipment: Stedy  Ambulation/Gait               General Gait Details: unable today   Stairs             Wheelchair Mobility    Modified Rankin (Stroke Patients Only) Modified Rankin (Stroke Patients Only) Pre-Morbid Rankin Score: No symptoms Modified Rankin: Severe disability     Balance Overall balance assessment: Needs assistance Sitting-balance support: Bilateral upper extremity supported;Feet supported Sitting balance-Leahy Scale: Poor Sitting balance - Comments: preference for posterior leaning Postural control: Posterior lean Standing balance support: Bilateral upper extremity supported Standing balance-Leahy Scale: Zero Standing balance comment: max +2                            Cognition Arousal/Alertness: Awake/alert Behavior During Therapy: Flat affect Overall Cognitive Status: Impaired/Different from baseline Area of Impairment: Following commands;Problem solving;Attention                   Current Attention Level: Sustained   Following Commands: Follows one step commands with increased time     Problem Solving: Slow processing;Decreased initiation;Difficulty sequencing;Requires tactile cues;Requires verbal cues General Comments: pt asks multiple times "so what are we doing?" during mobility tasks after cues have already been administered. Pt requires step-by-step cues for sequencing tasks.  Exercises General Exercises - Lower Extremity Long Arc Quad: AROM;Both;10 reps    General Comments        Pertinent Vitals/Pain Pain Assessment: Faces Faces Pain Scale: Hurts little more Pain Location: rt knee with attempts to flex knee prior to transfer Pain Descriptors / Indicators: Discomfort;Moaning Pain  Intervention(s): Limited activity within patient's tolerance;Monitored during session;Repositioned    Home Living                          Prior Function            PT Goals (current goals can now be found in the care plan section) Acute Rehab PT Goals Patient Stated Goal: son reports pt's family wants her to come home regardless of functional status and they will care for her; HCPOA daughter interested in CIR Time For Goal Achievement: 06/08/21 Potential to Achieve Goals: Fair Progress towards PT goals: Progressing toward goals    Frequency    Min 4X/week      PT Plan Current plan remains appropriate    Co-evaluation              AM-PAC PT "6 Clicks" Mobility   Outcome Measure  Help needed turning from your back to your side while in a flat bed without using bedrails?: A Lot Help needed moving from lying on your back to sitting on the side of a flat bed without using bedrails?: Total Help needed moving to and from a bed to a chair (including a wheelchair)?: Total Help needed standing up from a chair using your arms (e.g., wheelchair or bedside chair)?: Total Help needed to walk in hospital room?: Total Help needed climbing 3-5 steps with a railing? : Total 6 Click Score: 7    End of Session Equipment Utilized During Treatment: Gait belt Activity Tolerance: Patient tolerated treatment well Patient left: with call bell/phone within reach;with family/visitor present;in chair;with chair alarm set Nurse Communication: Mobility status (foot sliding off stedy; likely safer without) PT Visit Diagnosis: Muscle weakness (generalized) (M62.81);Unsteadiness on feet (R26.81);Difficulty in walking, not elsewhere classified (R26.2)     Time: 1335-1401 PT Time Calculation (min) (ACUTE ONLY): 26 min  Charges:  $Therapeutic Activity: 23-37 mins                      Jerolyn Center, PT Acute Rehabilitation Services  Pager [phone] Office [phone]    Zena Amos 05/27/2021, 2:24 PM

## 2021-05-28 ENCOUNTER — Encounter (HOSPITAL_COMMUNITY): Payer: Self-pay | Admitting: Physical Medicine and Rehabilitation

## 2021-05-28 ENCOUNTER — Other Ambulatory Visit: Payer: Self-pay

## 2021-05-28 ENCOUNTER — Inpatient Hospital Stay (HOSPITAL_COMMUNITY)
Admission: RE | Admit: 2021-05-28 | Discharge: 2021-06-11 | DRG: 057 | Disposition: A | Discharge status: Home Health | Payer: Medicare Other | Source: Intra-hospital | Attending: Physical Medicine and Rehabilitation | Admitting: Physical Medicine and Rehabilitation

## 2021-05-28 DIAGNOSIS — I63312 Cerebral infarction due to thrombosis of left middle cerebral artery: Secondary | ICD-10-CM | POA: Diagnosis not present

## 2021-05-28 DIAGNOSIS — J029 Acute pharyngitis, unspecified: Secondary | ICD-10-CM | POA: Diagnosis not present

## 2021-05-28 DIAGNOSIS — E871 Hypo-osmolality and hyponatremia: Secondary | ICD-10-CM | POA: Diagnosis not present

## 2021-05-28 DIAGNOSIS — I4892 Unspecified atrial flutter: Secondary | ICD-10-CM | POA: Diagnosis not present

## 2021-05-28 DIAGNOSIS — R0602 Shortness of breath: Secondary | ICD-10-CM | POA: Diagnosis not present

## 2021-05-28 DIAGNOSIS — F419 Anxiety disorder, unspecified: Secondary | ICD-10-CM | POA: Diagnosis present

## 2021-05-28 DIAGNOSIS — Z7982 Long term (current) use of aspirin: Secondary | ICD-10-CM | POA: Diagnosis not present

## 2021-05-28 DIAGNOSIS — K5901 Slow transit constipation: Secondary | ICD-10-CM | POA: Diagnosis present

## 2021-05-28 DIAGNOSIS — M533 Sacrococcygeal disorders, not elsewhere classified: Secondary | ICD-10-CM | POA: Diagnosis present

## 2021-05-28 DIAGNOSIS — Z79899 Other long term (current) drug therapy: Secondary | ICD-10-CM | POA: Diagnosis not present

## 2021-05-28 DIAGNOSIS — I119 Hypertensive heart disease without heart failure: Secondary | ICD-10-CM | POA: Diagnosis present

## 2021-05-28 DIAGNOSIS — I69351 Hemiplegia and hemiparesis following cerebral infarction affecting right dominant side: Secondary | ICD-10-CM | POA: Diagnosis present

## 2021-05-28 DIAGNOSIS — M130 Polyarthritis, unspecified: Secondary | ICD-10-CM | POA: Diagnosis present

## 2021-05-28 DIAGNOSIS — R1319 Other dysphagia: Secondary | ICD-10-CM | POA: Diagnosis present

## 2021-05-28 DIAGNOSIS — N179 Acute kidney failure, unspecified: Secondary | ICD-10-CM | POA: Diagnosis not present

## 2021-05-28 DIAGNOSIS — I69320 Aphasia following cerebral infarction: Secondary | ICD-10-CM | POA: Diagnosis not present

## 2021-05-28 DIAGNOSIS — E876 Hypokalemia: Secondary | ICD-10-CM | POA: Diagnosis not present

## 2021-05-28 DIAGNOSIS — Z7902 Long term (current) use of antithrombotics/antiplatelets: Secondary | ICD-10-CM

## 2021-05-28 DIAGNOSIS — I10 Essential (primary) hypertension: Secondary | ICD-10-CM | POA: Diagnosis not present

## 2021-05-28 DIAGNOSIS — Z8249 Family history of ischemic heart disease and other diseases of the circulatory system: Secondary | ICD-10-CM | POA: Diagnosis not present

## 2021-05-28 DIAGNOSIS — L89891 Pressure ulcer of other site, stage 1: Secondary | ICD-10-CM | POA: Diagnosis present

## 2021-05-28 DIAGNOSIS — Z87891 Personal history of nicotine dependence: Secondary | ICD-10-CM

## 2021-05-28 DIAGNOSIS — M1990 Unspecified osteoarthritis, unspecified site: Secondary | ICD-10-CM

## 2021-05-28 DIAGNOSIS — R059 Cough, unspecified: Secondary | ICD-10-CM

## 2021-05-28 DIAGNOSIS — E785 Hyperlipidemia, unspecified: Secondary | ICD-10-CM | POA: Diagnosis present

## 2021-05-28 DIAGNOSIS — I4891 Unspecified atrial fibrillation: Secondary | ICD-10-CM | POA: Diagnosis not present

## 2021-05-28 DIAGNOSIS — K219 Gastro-esophageal reflux disease without esophagitis: Secondary | ICD-10-CM | POA: Diagnosis present

## 2021-05-28 DIAGNOSIS — I63512 Cerebral infarction due to unspecified occlusion or stenosis of left middle cerebral artery: Secondary | ICD-10-CM | POA: Diagnosis not present

## 2021-05-28 DIAGNOSIS — H9190 Unspecified hearing loss, unspecified ear: Secondary | ICD-10-CM | POA: Diagnosis present

## 2021-05-28 LAB — CBC
HCT: 33.7 % — ABNORMAL LOW (ref 36.0–46.0)
Hemoglobin: 11.2 g/dL — ABNORMAL LOW (ref 12.0–15.0)
MCH: 27.8 pg (ref 26.0–34.0)
MCHC: 33.2 g/dL (ref 30.0–36.0)
MCV: 83.6 fL (ref 80.0–100.0)
Platelets: 214 10*3/uL (ref 150–400)
RBC: 4.03 MIL/uL (ref 3.87–5.11)
RDW: 16.2 % — ABNORMAL HIGH (ref 11.5–15.5)
WBC: 4.5 10*3/uL (ref 4.0–10.5)
nRBC: 0 % (ref 0.0–0.2)

## 2021-05-28 LAB — BASIC METABOLIC PANEL
Anion gap: 10 (ref 5–15)
BUN: 13 mg/dL (ref 8–23)
CO2: 21 mmol/L — ABNORMAL LOW (ref 22–32)
Calcium: 8.6 mg/dL — ABNORMAL LOW (ref 8.9–10.3)
Chloride: 104 mmol/L (ref 98–111)
Creatinine, Ser: 0.9 mg/dL (ref 0.44–1.00)
GFR, Estimated: 57 mL/min — ABNORMAL LOW (ref >60)
Glucose, Bld: 87 mg/dL (ref 70–99)
Potassium: 3.4 mmol/L — ABNORMAL LOW (ref 3.5–5.1)
Sodium: 135 mmol/L (ref 135–145)

## 2021-05-28 LAB — GLUCOSE, CAPILLARY: Glucose-Capillary: 110 mg/dL — ABNORMAL HIGH (ref 70–99)

## 2021-05-28 MED ORDER — POTASSIUM CHLORIDE CRYS ER 20 MEQ PO TBCR
20.0000 meq | EXTENDED_RELEASE_TABLET | Freq: Every day | ORAL | Status: DC
  Administered 2021-05-28: 20 meq via ORAL
  Filled 2021-05-28: qty 1

## 2021-05-28 MED ORDER — ALUM & MAG HYDROXIDE-SIMETH 200-200-20 MG/5ML PO SUSP: 30.0000 mL | ORAL | Status: DC | PRN

## 2021-05-28 MED ORDER — FLEET ENEMA 7-19 GM/118ML RE ENEM
1.0000 | ENEMA | Freq: Once | RECTAL | Status: AC | PRN
  Administered 2021-06-09: 1 via RECTAL
  Filled 2021-05-28: qty 1

## 2021-05-28 MED ORDER — DOCUSATE SODIUM 100 MG PO CAPS: 100.0000 mg | ORAL_CAPSULE | Freq: Two times a day (BID) | ORAL | Status: DC

## 2021-05-28 MED ORDER — ACETAMINOPHEN 325 MG PO TABS: 650.0000 mg | ORAL_TABLET | ORAL | 1 refills | Status: DC | PRN

## 2021-05-28 MED ORDER — PROCHLORPERAZINE 25 MG RE SUPP: 12.5000 mg | Freq: Four times a day (QID) | RECTAL | Status: DC | PRN

## 2021-05-28 MED ORDER — ACETAMINOPHEN 325 MG PO TABS
325.0000 mg | ORAL_TABLET | ORAL | Status: DC | PRN
  Administered 2021-05-29: 325 mg via ORAL
  Administered 2021-05-29 – 2021-06-09 (×17): 650 mg via ORAL
  Filled 2021-05-28 (×21): qty 2

## 2021-05-28 MED ORDER — CLOPIDOGREL BISULFATE 75 MG PO TABS
75.0000 mg | ORAL_TABLET | Freq: Every day | ORAL | Status: DC
  Administered 2021-05-29 – 2021-06-11 (×14): 75 mg via ORAL
  Filled 2021-05-28 (×14): qty 1

## 2021-05-28 MED ORDER — POLYETHYLENE GLYCOL 3350 17 G PO PACK: 17.0000 g | PACK | Freq: Every day | ORAL | Status: DC | PRN

## 2021-05-28 MED ORDER — ACETAMINOPHEN-CODEINE #3 300-30 MG PO TABS
0.5000 | ORAL_TABLET | Freq: Every day | ORAL | Status: AC
  Administered 2021-05-28 – 2021-06-08 (×12): 0.5 via ORAL
  Filled 2021-05-28 (×13): qty 1

## 2021-05-28 MED ORDER — ASPIRIN 81 MG PO CHEW: 81.0000 mg | CHEWABLE_TABLET | Freq: Every day | ORAL | 1 refills | Status: DC

## 2021-05-28 MED ORDER — TRAZODONE HCL 50 MG PO TABS
25.0000 mg | ORAL_TABLET | Freq: Every evening | ORAL | Status: DC | PRN
Start: 2021-05-28 — End: 2021-05-28

## 2021-05-28 MED ORDER — BISACODYL 10 MG RE SUPP
10.0000 mg | Freq: Every day | RECTAL | Status: DC | PRN
  Administered 2021-05-30: 10 mg via RECTAL
  Filled 2021-05-28 (×2): qty 1

## 2021-05-28 MED ORDER — CLOPIDOGREL BISULFATE 75 MG PO TABS: 75.0000 mg | ORAL_TABLET | Freq: Every day | ORAL | 1 refills | Status: DC

## 2021-05-28 MED ORDER — DIPHENHYDRAMINE HCL 12.5 MG/5ML PO ELIX: 12.5000 mg | ORAL_SOLUTION | Freq: Four times a day (QID) | ORAL | Status: DC | PRN

## 2021-05-28 MED ORDER — ENOXAPARIN SODIUM 30 MG/0.3ML IJ SOSY
30.0000 mg | PREFILLED_SYRINGE | INTRAMUSCULAR | Status: DC
  Administered 2021-05-28 – 2021-06-10 (×14): 30 mg via SUBCUTANEOUS
  Filled 2021-05-28 (×14): qty 0.3

## 2021-05-28 MED ORDER — ORAL CARE MOUTH RINSE
15.0000 mL | Freq: Two times a day (BID) | OROMUCOSAL | Status: DC
  Administered 2021-05-28 – 2021-06-10 (×19): 15 mL via OROMUCOSAL

## 2021-05-28 MED ORDER — DILTIAZEM HCL 60 MG PO TABS
30.0000 mg | ORAL_TABLET | Freq: Four times a day (QID) | ORAL | Status: DC
  Administered 2021-05-28 – 2021-06-11 (×55): 30 mg via ORAL
  Filled 2021-05-28 (×56): qty 1

## 2021-05-28 MED ORDER — DILTIAZEM HCL 30 MG PO TABS: 30.0000 mg | ORAL_TABLET | Freq: Four times a day (QID) | ORAL | 1 refills | Status: DC

## 2021-05-28 MED ORDER — PANTOPRAZOLE SODIUM 40 MG PO TBEC: 40.0000 mg | DELAYED_RELEASE_TABLET | Freq: Every day | ORAL | 1 refills | Status: DC

## 2021-05-28 MED ORDER — PROCHLORPERAZINE EDISYLATE 10 MG/2ML IJ SOLN: 5.0000 mg | Freq: Four times a day (QID) | INTRAMUSCULAR | Status: DC | PRN

## 2021-05-28 MED ORDER — DOCUSATE SODIUM 100 MG PO CAPS
100.0000 mg | ORAL_CAPSULE | Freq: Two times a day (BID) | ORAL | Status: DC
  Administered 2021-05-28 – 2021-06-11 (×28): 100 mg via ORAL
  Filled 2021-05-28 (×29): qty 1

## 2021-05-28 MED ORDER — PROCHLORPERAZINE MALEATE 5 MG PO TABS: 5.0000 mg | ORAL_TABLET | Freq: Four times a day (QID) | ORAL | Status: DC | PRN

## 2021-05-28 MED ORDER — PANTOPRAZOLE SODIUM 40 MG PO TBEC
40.0000 mg | DELAYED_RELEASE_TABLET | Freq: Every day | ORAL | Status: DC
  Administered 2021-05-28 – 2021-06-10 (×14): 40 mg via ORAL
  Filled 2021-05-28 (×15): qty 1

## 2021-05-28 MED ORDER — POLYETHYLENE GLYCOL 3350 17 G PO PACK
17.0000 g | PACK | Freq: Every day | ORAL | Status: DC
  Administered 2021-05-28 – 2021-06-04 (×8): 17 g via ORAL
  Filled 2021-05-28 (×8): qty 1

## 2021-05-28 MED ORDER — ASPIRIN 81 MG PO CHEW
81.0000 mg | CHEWABLE_TABLET | Freq: Every day | ORAL | Status: DC
  Administered 2021-05-29 – 2021-06-11 (×14): 81 mg via ORAL
  Filled 2021-05-28 (×14): qty 1

## 2021-05-28 MED ORDER — GUAIFENESIN-DM 100-10 MG/5ML PO SYRP: 5.0000 mL | ORAL_SOLUTION | Freq: Four times a day (QID) | ORAL | Status: DC | PRN

## 2021-05-28 MED ORDER — HYDROCORTISONE ACETATE 25 MG RE SUPP
25.0000 mg | Freq: Two times a day (BID) | RECTAL | Status: DC
  Administered 2021-05-28 – 2021-06-11 (×27): 25 mg via RECTAL
  Filled 2021-05-28 (×29): qty 1

## 2021-05-28 MED ORDER — POTASSIUM CHLORIDE 20 MEQ PO PACK
20.0000 meq | PACK | Freq: Every day | ORAL | Status: AC
  Administered 2021-05-29: 20 meq
  Filled 2021-05-28: qty 1

## 2021-05-28 MED ORDER — DOCUSATE SODIUM 100 MG PO CAPS: 100.0000 mg | ORAL_CAPSULE | Freq: Two times a day (BID) | ORAL | 0 refills | Status: DC

## 2021-05-28 MED ORDER — SORBITOL 70 % SOLN
30.0000 mL | Freq: Every day | Status: DC | PRN
  Administered 2021-05-29: 30 mL via ORAL
  Filled 2021-05-28 (×2): qty 30

## 2021-05-28 MED ORDER — MAGNESIUM GLUCONATE 500 MG PO TABS
250.0000 mg | ORAL_TABLET | Freq: Every day | ORAL | Status: DC
  Administered 2021-05-28: 250 mg via ORAL
  Filled 2021-05-28: qty 1

## 2021-05-28 MED ORDER — CHLORHEXIDINE GLUCONATE 0.12 % MT SOLN
15.0000 mL | Freq: Two times a day (BID) | OROMUCOSAL | Status: DC
  Administered 2021-05-28 – 2021-06-11 (×21): 15 mL via OROMUCOSAL
  Filled 2021-05-28 (×23): qty 15

## 2021-05-28 NOTE — H&P (Signed)
Physical Medicine and Rehabilitation Admission H&P    Chief Complaint  Patient presents with   Functional decline due to stroke    HPI: Sherry [name] is a 87 year old female with history of HTN, AFib, advanced OA right knee and bilateral ankle tendinitis who was admitted on 05/24/21 with onset of right sided weakness with inability to speak and right visual field deficits. CTA head showed no LVO but L-ICA stenosis with radiographic string sign, 65-70% R-ICA stenosis, heavy calcified bilateral ICA siphons, fusiform aneurysmal dilatation of aortic arch with bulky soft mural plaque or thrombus. She received TNK and follow up CT without bleed. Carotid dopplers were negative for ICA stenosis. 2 D echo showed eF 60-65% with no wall abnormality, bilatrial dilatation, severe aortic stenosis, severe mitral calcification, moderate to severe TVR. She did develop A fib/Aflutter on 11/07 and PCCM recommended resuming Cardizem as well as questioned AC with high risk for falls.   Dr. Pearlean BrownieSethi felt that stroke was embolic from A fib/A flutter and recommended ASA/Plavix X 3 months.  Mentation is improving with increase in verbal output and on D1, thins (ate blenderized food and ensure PTA). PT/OT has been cotreating patient and family requested CIR for progressive therapy. She continues to be limited by weakness with posterior bias on standing attempts, has delay in processing with anxiety and requires step by step verbal/tactile cues for sequencing with ADL tasks. CIR recommended due to functional decline. She currently has no new complaints.    Review of Systems  Constitutional:  Negative for chills and fever.  HENT:  Positive for hearing loss.   Eyes:  Negative for pain.  Respiratory:  Negative for shortness of breath.   Cardiovascular:  Negative for chest pain.  Gastrointestinal:  Positive for abdominal pain and constipation.  Musculoskeletal:  Positive for myalgias.  Neurological:  Positive for focal  weakness. Negative for headaches.  Psychiatric/Behavioral:  The patient has insomnia.     Past Medical History:  Diagnosis Date   Acid reflux    Arthritis    Atrial fibrillation (HCC)    Chronic constipation    GERD (gastroesophageal reflux disease)    Hyperlipidemia    Hypertension    Osteoarthritis    Pressure ulcer of sacral region     Past Surgical History:  Procedure Laterality Date   NO PAST SURGERIES      Family History  Problem Relation Age of Onset   Hypertension Mother    Other Brother        malignant neoplastic disease   Colon cancer Neg Hx    Stomach cancer Neg Hx    Esophageal cancer Neg Hx    Pancreatic cancer Neg Hx     Social History: Lives with family. Has an aide for 2 hours and family assists with B/D/feeding and provides supervision with mobility? Per  reports that she has quit smoking. She has never used smokeless tobacco. Per reports that she does not drink alcohol and does not use drugs.   Allergies: No Known Allergies   Medications Prior to Admission  Medication Sig Dispense Refill   acetaminophen (TYLENOL) 325 MG tablet Take 2 tablets (650 mg total) by mouth every 4 (four) hours as needed for mild pain (or temp > 37.5 C (99.5 F)). 30 tablet 1   [START ON 05/29/2021] aspirin 81 MG chewable tablet Chew 1 tablet (81 mg total) by mouth daily. 30 tablet 1   [START ON 05/29/2021] clopidogrel (PLAVIX) 75 MG tablet Take 1  tablet (75 mg total) by mouth daily. 30 tablet 1   diltiazem (CARDIZEM) 30 MG tablet Take 1 tablet (30 mg total) by mouth every 6 (six) hours. 60 tablet 1   docusate sodium (COLACE) 100 MG capsule Take 1 capsule (100 mg total) by mouth 2 (two) times daily. 10 capsule 0   pantoprazole (PROTONIX) 40 MG tablet Take 1 tablet (40 mg total) by mouth at bedtime. 30 tablet 1    Drug Regimen Review  Drug regimen was reviewed and remains appropriate with no significant issues identified  Home: Home Living Family/patient expects to be  discharged to:: Private residence Living Arrangements: Children Available Help at Discharge: Family, Available 24 hours/day Type of Home: House Home Access: Ramped entrance Home Layout: One level Bathroom Shower/Tub: Health visitor: Standard Home Equipment: Agricultural consultant (2 wheels), BSC/3in1, Wheelchair - manual, Grab bars - tub/shower, Grab bars - toilet Additional Comments: Aide comes mon, wednesday, friday for three hours  Lives With: Son, Daughter   Functional History: Prior Function Prior Level of Function : Needs assist Physical Assist : Mobility (physical), ADLs (physical) Mobility (physical): Gait ADLs (physical): Bathing, Dressing Mobility Comments: pt was able to ambulate with her RW around her home with PT or family behind her for safety ADLs Comments: Aide and family assist with bathing and dressing   Functional Status:  Mobility: Bed Mobility Overal bed mobility: Needs Assistance Bed Mobility: Supine to Sit Supine to sit: +2 for physical assistance, Mod assist Sit to supine: Max assist, +2 for physical assistance General bed mobility comments: HOB elevated, pt able to move legs toward EOB but needed assist to finish getting legs over EOB and to raise torso and scoot out to EOB Transfers Overall transfer level: Needs assistance Equipment used: 2 person hand held assist Transfers: Bed to chair/wheelchair/BSC Sit to Stand: Mod assist, +2 physical assistance Bed to/from chair/wheelchair/BSC transfer type:: Via Lift equipment Stand pivot transfers:  (via stedy) Squat pivot transfers: Max assist, +2 physical assistance Transfer via Lift Equipment: Stedy General transfer comment: stood from elevated EOB with +2 mod; pt with difficulty sitting on seat as her left foot kept sliding forward and 1/2 way off platform; stood again from stedy and sat in recliner Ambulation/Gait General Gait Details: unable today   ADL:   Cognition: Cognition Overall  Cognitive Status: Impaired/Different from baseline Arousal/Alertness: Awake/alert Orientation Level: Oriented to person, Oriented to place Behaviors: Perseveration Cognition Arousal/Alertness: Awake/alert Behavior During Therapy: Flat affect Overall Cognitive Status: Impaired/Different from baseline Area of Impairment: Following commands, Problem solving, Attention Current Attention Level: Sustained Following Commands: Follows one step commands with increased time Problem Solving: Slow processing, Decreased initiation, Difficulty sequencing, Requires tactile cues, Requires verbal cues General Comments: pt asks multiple times "so what are we doing?" during mobility tasks after cues have already been administered. Pt requires step-by-step cues for sequencing tasks. Difficult to assess due to: Impaired communication     Blood pressure 125/73, pulse 88, temperature 98 F (36.7 C), temperature source Oral, resp. rate 18, height 5\' 1"  (1.549 m), SpO2 99 %. Physical Exam Gen: no distress, normal appearing HEENT: oral mucosa pink and moist, NCAT Cardio: Reg rate Chest: normal effort, normal rate of breathing Abd: soft, non-distended Ext: no edema Psych: pleasant, normal affect Skin: Keratotic horn on left shin.   Neuro: Decreased hearing. Oriented to self. Able to state age as 74 and DOB as [birth year]. Delay in processing but she was able simple motor commands with verbal and tactile cues.  Right sided weakness noted --unable to perform MMT due to cognitive issues and reports of being cold/wanted to be covered.    Results for orders placed or performed during the hospital encounter of 05/24/21 (from the past 48 hour(s))  CBC     Status: Abnormal   Collection Time: 05/27/21  2:26 AM  Result Value Ref Range   WBC 6.3 4.0 - 10.5 K/uL   RBC 4.38 3.87 - 5.11 MIL/uL   Hemoglobin 11.8 (L) 12.0 - 15.0 g/dL   HCT 14.4 81.8 - 56.3 %   MCV 83.8 80.0 - 100.0 fL   MCH 26.9 26.0 - 34.0 pg   MCHC 32.2 30.0  - 36.0 g/dL   RDW 14.9 (H) 70.2 - 63.7 %   Platelets 223 150 - 400 K/uL   nRBC 0.0 0.0 - 0.2 %    Comment: Performed at [facility], [address], [place], [place] [address]  Basic metabolic panel     Status: Abnormal   Collection Time: 05/27/21  2:26 AM  Result Value Ref Range   Sodium 134 (L) 135 - 145 mmol/L   Potassium 3.3 (L) 3.5 - 5.1 mmol/L   Chloride 103 98 - 111 mmol/L   CO2 21 (L) 22 - 32 mmol/L   Glucose, Bld 114 (H) 70 - 99 mg/dL    Comment: Glucose reference range applies only to samples taken after fasting for at least 8 hours.   BUN 11 8 - 23 mg/dL   Creatinine, Ser 0.27 0.44 - 1.00 mg/dL   Calcium 8.9 8.9 - 74.1 mg/dL   GFR, Estimated 54 (L) >60 mL/min    Comment: (NOTE) Calculated using the CKD-EPI Creatinine Equation (2021)    Anion gap 10 5 - 15    Comment: Performed at [facility], [address], [place], [place] [address]  CBC     Status: Abnormal   Collection Time: 05/28/21  2:26 AM  Result Value Ref Range   WBC 4.5 4.0 - 10.5 K/uL   RBC 4.03 3.87 - 5.11 MIL/uL   Hemoglobin 11.2 (L) 12.0 - 15.0 g/dL   HCT 76.7 (L) 20.9 - 47.0 %   MCV 83.6 80.0 - 100.0 fL   MCH 27.8 26.0 - 34.0 pg   MCHC 33.2 30.0 - 36.0 g/dL   RDW 96.2 (H) 83.6 - 62.9 %   Platelets 214 150 - 400 K/uL   nRBC 0.0 0.0 - 0.2 %    Comment: Performed at [facility], [address] [place], [place] [address]  Basic metabolic panel     Status: Abnormal   Collection Time: 05/28/21  2:26 AM  Result Value Ref Range   Sodium 135 135 - 145 mmol/L   Potassium 3.4 (L) 3.5 - 5.1 mmol/L   Chloride 104 98 - 111 mmol/L   CO2 21 (L) 22 - 32 mmol/L   Glucose, Bld 87 70 - 99 mg/dL    Comment: Glucose reference range applies only to samples taken after fasting for at least 8 hours.   BUN 13 8 - 23 mg/dL   Creatinine, Ser 6.50 0.44 - 1.00 mg/dL   Calcium 8.6 (L) 8.9 - 10.3 mg/dL   GFR, Estimated 57 (L) >60 mL/min    Comment: (NOTE) Calculated using the CKD-EPI Creatinine  Equation (2021)    Anion gap 10 5 - 15    Comment: Performed at [facility], [address], [place], [place] [address]  No results found.     Medical Problem List and Plan: 1.  Left MCA CVA  -patient may shower  -ELOS/Goals: 2-3 weeks MinA  -check Vitamin D level tomorrow morning  -admit to CIR 2.  Antithrombotics: -DVT/anticoagulation:  Pharmaceutical: Lovenox  -antiplatelet therapy: DAPT X 3 months-->to follow up with neurology for further input.  3. Pain: continue Tylenol #3 1/2 tab at bedtime daily  --tylenol prn  4. Mood: LCSW to follow for evaluation and support.   -antipsychotic agents: N/A 5. Neuropsych: This patient is not fully capable of making decisions on her own behalf. 6. Skin/Wound Care: Pressure relief measures.  --Turn every 2 hours while in bed.   --foam dressing for protection.  7. Fluids/Electrolytes/Nutrition: Monitor I/O. Check CMET in am. 8. A fib: Monitor HR TID--continue Cardizem 30 mg TID.  9. Chronic dysphagia: Will continue home diet of ensures with pureed food.   10. Persistent Hypokalemia: Will supplement today and tomorrow  --check CMET/mg level in am. 11. Chronic constipation:  add magnesium gluconate 250mg  HS  I have personally performed a face to face diagnostic evaluation, including, but not limited to relevant history and physical exam findings, of this patient and developed relevant assessment and plan.  Additionally, I have reviewed and concur with the physician assistant's documentation above.  Izora Ribas, MD 05/28/2021   Bary Leriche, PA-C

## 2021-05-28 NOTE — Progress Notes (Signed)
Physical Therapy Treatment Patient Details Name: Sherry [name] MRN: [id number] DOB: Jan 01, 1920 Today's Date: 05/28/2021   History of Present Illness 87 yo female presenitng to ED on 11/5 with R sided weakness and speech difficulty. 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 PMH including a-fib, arthritis, HTN, OA, and pressure ulcer of sacral region.    PT Comments    Co-treatment with OT for 2 sets of skilled hands to assist with balance and mobility and to increase her confidence and decr fear of falling.  Patient able to follow instructional cues with facilitation to bring her body forward over her BOS (tends to have posterior bias with feet then sliding forward). Especially improved with standing at sink.    Recommendations for follow up therapy are one component of a multi-disciplinary discharge planning process, led by the attending physician.  Recommendations may be updated based on patient status, additional functional criteria and insurance authorization.  Follow Up Recommendations  Acute inpatient rehab (3hours/day)     Assistance Recommended at Discharge Frequent or constant Supervision/Assistance  Equipment Recommendations  None recommended by PT    Recommendations for Other Services       Precautions / Restrictions Precautions Precautions: Fall     Mobility  Bed Mobility Overal bed mobility: Needs Assistance Bed Mobility: Supine to Sit     Supine to sit: Mod assist;HOB elevated     General bed mobility comments: HOB elevated, pt able to move legs over EOB and scoot part way to EOB (getting feet closer to floor); ultimately needed assist to scoot pelvis and get feet on the floor    Transfers Overall transfer level: Needs assistance Equipment used: 2 person hand held assist Transfers: Bed to chair/wheelchair/BSC Sit to Stand: Mod assist;+2 physical assistance Stand pivot transfers:  (via stedy)   Step pivot transfers: Mod assist;+2 physical  assistance     General transfer comment: stood from EOB x2 and from recliner x2; from EOB with worse posterior lean with feet sliding forward; when at sink, pt was able to reach forward with LUE to hold edge of sink and balanced over her feet well enough to use one UE support while turning on water and wetting washcloth; stood for up to 3 minutes    Ambulation/Gait               General Gait Details: pivotal steps bed to chair   Stairs             Wheelchair Mobility    Modified Rankin (Stroke Patients Only) Modified Rankin (Stroke Patients Only) Pre-Morbid Rankin Score: No symptoms Modified Rankin: Severe disability     Balance Overall balance assessment: Needs assistance Sitting-balance support: Bilateral upper extremity supported;Feet supported Sitting balance-Leahy Scale: Poor Sitting balance - Comments: preference for posterior leaning Postural control: Posterior lean Standing balance support: Bilateral upper extremity supported Standing balance-Leahy Scale: Zero                              Cognition Arousal/Alertness: Awake/alert Behavior During Therapy: Anxious Overall Cognitive Status: Impaired/Different from baseline Area of Impairment: Following commands;Problem solving;Attention                   Current Attention Level: Sustained   Following Commands: Follows one step commands consistently     Problem Solving: Slow processing;Decreased initiation;Difficulty sequencing;Requires tactile cues;Requires verbal cues General Comments: Pt requires step-by-step cues for sequencing some tasks.  Exercises      General Comments        Pertinent Vitals/Pain Pain Assessment: Faces Faces Pain Scale: Hurts little more Pain Location: rt knee with attempts to flex knee prior to transfer Pain Descriptors / Indicators: Discomfort Pain Intervention(s): Limited activity within patient's tolerance;Monitored during  session;Repositioned    Home Living                          Prior Function            PT Goals (current goals can now be found in the care plan section) Acute Rehab PT Goals Patient Stated Goal: son reports pt's family wants her to come home regardless of functional status and they will care for her; HCPOA daughter interested in CIR Time For Goal Achievement: 06/08/21 Potential to Achieve Goals: Fair Progress towards PT goals: Progressing toward goals    Frequency    Min 4X/week      PT Plan Current plan remains appropriate    Co-evaluation PT/OT/SLP Co-Evaluation/Treatment: Yes Reason for Co-Treatment: Complexity of the patient's impairments (multi-system involvement);To address functional/ADL transfers          AM-PAC PT "6 Clicks" Mobility   Outcome Measure  Help needed turning from your back to your side while in a flat bed without using bedrails?: A Lot Help needed moving from lying on your back to sitting on the side of a flat bed without using bedrails?: A Lot Help needed moving to and from a bed to a chair (including a wheelchair)?: Total Help needed standing up from a chair using your arms (e.g., wheelchair or bedside chair)?: Total Help needed to walk in hospital room?: Total Help needed climbing 3-5 steps with a railing? : Total 6 Click Score: 8    End of Session Equipment Utilized During Treatment: Gait belt Activity Tolerance: Patient tolerated treatment well Patient left: with call bell/phone within reach;with family/visitor present;in chair;with chair alarm set Nurse Communication: Mobility status (2 person HHA with gait belt) PT Visit Diagnosis: Muscle weakness (generalized) (M62.81);Unsteadiness on feet (R26.81);Difficulty in walking, not elsewhere classified (R26.2)     Time: [id number] PT Time Calculation (min) (ACUTE ONLY): 28 min  Charges:  $Therapeutic Activity: 8-22 mins                      Arby Barrette, PT Acute Rehabilitation  Services  Pager [phone] Office [phone]    Rexanne Mano 05/28/2021, 12:24 PM

## 2021-05-28 NOTE — Progress Notes (Signed)
Patient transferred to [facility] via recliner chair with two assists

## 2021-05-28 NOTE — Progress Notes (Addendum)
Inpatient Rehabilitation Admission Medication Review by a Pharmacist  A complete drug regimen review was completed for this patient to identify any potential clinically significant medication issues.  High Risk Drug Classes Is patient taking? Indication by Medication  Antipsychotic Yes Compazine for N/V  Anticoagulant No   Antibiotic No   Opioid Yes Tylenol #3 for pain  Antiplatelet Yes Aspirin / Plavix for CVA  Hypoglycemics/insulin No   Vasoactive Medication Yes Diltiazem for BP, Afib  Chemotherapy No   Other No      Type of Medication Issue Identified Description of Issue Recommendation(s)  Drug Interaction(s) (clinically significant)     Duplicate Therapy     Allergy     No Medication Administration End Date     Incorrect Dose     Additional Drug Therapy Needed     Significant med changes from prior encounter (inform family/care partners about these prior to discharge).    Other       Clinically significant medication issues were identified that warrant physician communication and completion of prescribed/recommended actions by midnight of the next day:  No  Pharmacist comments: Resume Lovenox for VTE ppx  Time spent performing this drug regimen review (minutes):  20 minutes   Elwin Sleight 05/28/2021 3:33 PM

## 2021-05-28 NOTE — Discharge Summary (Addendum)
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 fib/flutter s/p IV TNK administration   Pressure injury of skin Aphasia Right hemiparesis   Allergies as of 05/28/2021   No Known Allergies      Medication List     STOP taking these medications    aspirin EC 81 MG tablet Replaced by: aspirin 81 MG chewable tablet   lactulose 10 GM/15ML solution Commonly known as: CHRONULAC   metoCLOPramide 5 MG tablet Commonly known as: REGLAN   metoprolol tartrate 50 MG tablet Commonly known as: LOPRESSOR   nystatin 100000 UNIT/ML suspension Commonly known as: MYCOSTATIN   omeprazole 40 MG capsule Commonly known as: PRILOSEC   polyethylene glycol powder 17 GM/SCOOP powder Commonly known as: GLYCOLAX/MIRALAX   traMADol 50 MG tablet Commonly known as: ULTRAM       TAKE these medications    acetaminophen 325 MG tablet Commonly known as: TYLENOL Take 2 tablets (650 mg total) by mouth every 4 (four) hours as needed for mild pain (or temp > 37.5 C (99.5 F)). What changed:  medication strength how much to take when to take this reasons to take this   aspirin 81 MG chewable tablet Chew 1 tablet (81 mg total) by mouth daily. Start taking on: May 29, 2021 Replaces: aspirin EC 81 MG tablet   clopidogrel 75 MG tablet Commonly known as: PLAVIX Take 1 tablet (75 mg total) by mouth daily. Start taking on: May 29, 2021   diltiazem 30 MG tablet Commonly known as: CARDIZEM Take 1 tablet (30 mg total) by mouth every 6 (six) hours. What changed: when to take this   docusate sodium 100 MG capsule Commonly known as:  COLACE Take 1 capsule (100 mg total) by mouth 2 (two) times daily.   pantoprazole 40 MG tablet Commonly known as: PROTONIX Take 1 tablet (40 mg total) by mouth at bedtime.        LABORATORY STUDIES CBC    Component Value Date/Time   WBC 4.5 05/28/2021 0226   RBC 4.03 05/28/2021 0226   HGB 11.2 (L) 05/28/2021 0226   HCT 33.7 (L) 05/28/2021 0226   PLT 214 05/28/2021 0226   MCV 83.6 05/28/2021 0226   MCH 27.8 05/28/2021 0226   MCHC 33.2 05/28/2021 0226   RDW 16.2 (H) 05/28/2021 0226   LYMPHSABS 1.0 05/24/2021 0715   MONOABS 0.5 05/24/2021 0715   EOSABS 0.1 05/24/2021 0715   BASOSABS 0.0 05/24/2021 0715   CMP    Component Value Date/Time   NA 135 05/28/2021 0226   K 3.4 (L) 05/28/2021 0226   CL 104 05/28/2021 0226   CO2 21 (L) 05/28/2021 0226   GLUCOSE 87 05/28/2021 0226   BUN 13 05/28/2021 0226   CREATININE 0.90 05/28/2021 0226   CALCIUM 8.6 (L) 05/28/2021 0226   PROT 6.7 05/24/2021 0715   ALBUMIN 3.2 (L) 05/24/2021 0715   AST 28 05/24/2021 0715   ALT 11 05/24/2021 0715   ALKPHOS 108 05/24/2021 0715   BILITOT 1.2 05/24/2021 0715   GFRNONAA 57 (L) 05/28/2021 0226   GFRAA 43 (L) 08/07/2017 1519  COAGS Lab Results  Component Value Date   INR 1.1 05/24/2021   INR 1.0 05/27/2007   Lipid Panel    Component Value Date/Time   CHOL 208 (H) 05/24/2021 0715   TRIG 100 05/24/2021 0715   HDL 53 05/24/2021 0715   CHOLHDL 3.9 05/24/2021 0715   VLDL 20 05/24/2021 0715   LDLCALC 135 (H) 05/24/2021 0715   HgbA1C  Lab Results  Component Value Date   HGBA1C 5.3 05/24/2021   Urinalysis    Component Value Date/Time   COLORURINE YELLOW 03/27/2021 1239   APPEARANCEUR HAZY (A) 03/27/2021 1239   LABSPEC 1.019 03/27/2021 1239   PHURINE 6.0 03/27/2021 1239   GLUCOSEU NEGATIVE 03/27/2021 1239   HGBUR NEGATIVE 03/27/2021 1239   BILIRUBINUR NEGATIVE 03/27/2021 1239   KETONESUR NEGATIVE 03/27/2021 1239   PROTEINUR NEGATIVE 03/27/2021 1239   UROBILINOGEN 0.2 01/19/2010  0042   NITRITE NEGATIVE 03/27/2021 1239   LEUKOCYTESUR MODERATE (A) 03/27/2021 1239   Urine Drug Screen No results found for: LABOPIA, COCAINSCRNUR, LABBENZ, AMPHETMU, THCU, LABBARB  Alcohol Level No results found for: ETH   SIGNIFICANT DIAGNOSTIC STUDIES CT HEAD WO CONTRAST  Result Date: 05/25/2021 CLINICAL DATA:  87 year old female code stroke presentation with patchy left MCA/operculum infarcts on MRI this morning. EXAM: CT HEAD WITHOUT CONTRAST TECHNIQUE: Contiguous axial images were obtained from the base of the skull through the vertex without intravenous contrast. COMPARISON:  Brain MRI today.  CT and CTA yesterday. FINDINGS: Brain: Patchy acute infarct of the left operculum is fairly subtle by CT (series 5, image 34). No hemorrhage or mass effect. Underlying chronic posterior periatrial white matter hypodensity on that side. Stable gray-white matter differentiation elsewhere. No midline shift, ventriculomegaly, mass effect, evidence of mass lesion, or intracranial hemorrhage. Bilateral basal ganglia calcifications. Vascular: Calcified atherosclerosis at the skull base. No suspicious intracranial vascular hyperdensity. Skull: No acute osseous abnormality identified. Sinuses/Orbits: Visualized paranasal sinuses and mastoids are stable and well aerated. Other: Visualized orbits and scalp soft tissues are within normal limits. IMPRESSION: 1. Patchy infarct of the left operculum is subtle by CT. No associated hemorrhage or mass effect. 2. No new intracranial abnormality. Electronically Signed   By: Sherry [name] M.D.   On: 05/25/2021 10:22   MR BRAIN WO CONTRAST  Result Date: 05/25/2021 CLINICAL DATA:  87 year old female code stroke presentation yesterday with weakness, aphasia. EXAM: MRI HEAD WITHOUT CONTRAST TECHNIQUE: Multiplanar, multiecho pulse sequences of the brain and surrounding structures were obtained without intravenous contrast. COMPARISON:  CT head and CTA head and neck yesterday.  FINDINGS: Brain: Patchy, confluent roughly 3 cm area of restricted diffusion in the posterior left operculum. Series 7, image 51. Patchy additional cortex and subcortical white matter involvement in the anterior operculum on series 5, image 85. Insula and temporal lobe relatively spared. No other restricted diffusion. Chronic microhemorrhage in the left periatrial white matter posterior to the area of active ischemia. Minor T2 and FLAIR hyperintensity associated with the acute infarcts. No acute hemorrhage or mass effect. No other chronic cerebral blood products. Patchy left periatrial asymmetric white matter T2 and FLAIR hyperintensity. But elsewhere largely normal gray and white matter signal for age. No restricted diffusion to suggest acute infarction. No midline shift, mass effect, evidence of mass lesion, ventriculomegaly, extra-axial collection or acute intracranial hemorrhage. Vascular: Major intracranial vascular flow voids are preserved. Skull and upper cervical spine: Multilevel cervical spine degeneration. 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 Visualized bone marrow signal is within normal limits. Sinuses/Orbits: Negative, postoperative changes to the left  globe. Other: Mastoids are well aerated. Visible internal auditory structures appear normal. IMPRESSION: 1. Patchy acute infarcts in the middle Left MCA territory, operculum. No associated acute hemorrhage or mass effect. 2. Mild for age underlying chronic small vessel disease, primarily in the left periatrial white matter. Electronically Signed   By: Sherry [name] M.D.   On: 05/25/2021 05:45   ECHOCARDIOGRAM COMPLETE  Result Date: 05/27/2021    ECHOCARDIOGRAM REPORT   Patient Name:   Sherry [name] [facility] Date of Exam: 05/25/2021 Medical Rec #:  [id number]       Height:       61.0 in Accession #:    [id number]      Weight:       102.0 lb Date of Birth:  1920-03-28        BSA:          1.419 m Patient Age:    76 years        BP:           154/85 mmHg Patient Gender: F                HR:           87 bpm. Exam Location:  Inpatient Procedure: 2D Echo, Cardiac Doppler, Limited Color Doppler and Color Doppler Indications:     Stroke  History:         Patient has no prior history of Echocardiogram examinations.  Sonographer:     Merrie Roof RDCS Referring Phys:  Izard Diagnosing Phys: Sherry [name] IMPRESSIONS  1. Left ventricular ejection fraction, by estimation, is 60 to 65%. The left ventricle has normal function. The left ventricle has no regional wall motion abnormalities. There is mild concentric left ventricular hypertrophy. Left ventricular diastolic parameters are indeterminate.  2. Right ventricular systolic function is normal. The right ventricular size is normal. There is normal pulmonary artery systolic pressure.  3. Left atrial size was severely dilated.  4. Right atrial size was moderately dilated.  5. The mitral valve is degenerative. Severe mitral valve regurgitation. Severe mitral annular calcification.  6. The tricuspid valve is degenerative. Tricuspid valve regurgitation is moderate to severe.  7. The aortic valve is tricuspid. There is severe calcifcation of the aortic valve. There is severe thickening of the aortic valve. Aortic valve regurgitation is mild. Severe aortic valve stenosis.  8. There is Moderate (Grade III) atheroma plaque involving the aortic root and ascending aorta.  9. The inferior vena cava is normal in size with greater than 50% respiratory variability, suggesting right atrial pressure of 3 mmHg. Conclusion(s)/Recommendation(s): Findings consistent with severe valvular heart disease. FINDINGS  Left Ventricle: Left ventricular ejection fraction, by estimation, is 60 to 65%. The left ventricle has normal function. The left ventricle has no regional wall motion abnormalities. The left ventricular internal cavity size was normal in size. There is  mild concentric left ventricular hypertrophy. Left ventricular diastolic parameters  are indeterminate. Right Ventricle: The right ventricular size is normal. No increase in right ventricular wall thickness. Right ventricular systolic function is normal. There is normal pulmonary artery systolic pressure. The tricuspid regurgitant velocity is 2.68 m/s, and  with an assumed right atrial pressure of 3 mmHg, the estimated right ventricular systolic pressure is [id number] mmHg. Left Atrium: Left atrial size was severely dilated. Right Atrium: Right atrial size was moderately dilated. Pericardium: There is no evidence of pericardial effusion. Mitral Valve: The mitral valve is degenerative in appearance. There is moderate  thickening of the mitral valve leaflet(s). There is severe calcification of the mitral valve leaflet(s). Mildly decreased mobility of the mitral valve leaflets. Severe mitral  annular calcification. Severe mitral valve regurgitation. Tricuspid Valve: The tricuspid valve is degenerative in appearance. Tricuspid valve regurgitation is moderate to severe. Aortic Valve: The aortic valve is tricuspid. There is severe calcifcation of the aortic valve. There is severe thickening of the aortic valve. There is moderate to severe aortic valve annular calcification. Aortic valve regurgitation is mild. Aortic regurgitation PHT measures 420 msec. Severe aortic stenosis is present. Aortic valve mean gradient measures 19.0 mmHg. Aortic valve peak gradient measures 34.1 mmHg. Aortic valve area, by VTI measures 0.97 cm. Pulmonic Valve: The pulmonic valve was normal in structure. Pulmonic valve regurgitation is mild. Aorta: The aortic root is normal in size and structure. There is moderate (Grade III) atheroma plaque involving the aortic root and ascending aorta. Venous: The inferior vena cava is normal in size with greater than 50% respiratory variability, suggesting right atrial pressure of 3 mmHg. IAS/Shunts: The atrial septum is grossly normal.  LEFT VENTRICLE PLAX 2D LVIDd:         3.70 cm LVIDs:          2.40 cm LV PW:         1.00 cm LV IVS:        1.20 cm LVOT diam:     2.00 cm LV SV:         46 LV SV Index:   33 LVOT Area:     3.14 cm  RIGHT VENTRICLE RV Basal diam:  2.80 cm LEFT ATRIUM              Index        RIGHT ATRIUM           Index LA diam:        4.50 cm  3.17 cm/m   RA Area:     12.00 cm LA Vol (A2C):   106.0 ml 74.72 ml/m  RA Volume:   21.30 ml  15.01 ml/m LA Vol (A4C):   79.0 ml  55.68 ml/m LA Biplane Vol: 96.7 ml  68.16 ml/m  AORTIC VALVE AV Area (Vmax):    1.01 cm AV Area (Vmean):   0.96 cm AV Area (VTI):     0.97 cm AV Vmax:           292.00 cm/s AV Vmean:          204.000 cm/s AV VTI:            0.478 m AV Peak Grad:      34.1 mmHg AV Mean Grad:      19.0 mmHg LVOT Vmax:         94.30 cm/s LVOT Vmean:        62.400 cm/s LVOT VTI:          0.148 m LVOT/AV VTI ratio: 0.31 AI PHT:            420 msec  AORTA Ao Root diam: 3.30 cm MITRAL VALVE                  TRICUSPID VALVE MV Area (PHT): 3.60 cm       TR Peak grad:   28.7 mmHg MV Decel Time: 211 msec       TR Vmax:        268.00 cm/s MR Peak grad:    132.7 mmHg MR Vmax:  576.00 cm/s  SHUNTS MR PISA:         0.57 cm     Systemic VTI:  0.15 m MR PISA Eff ROA: 3 mm        Systemic Diam: 2.00 cm MR PISA Radius:  0.30 cm MV E velocity: 121.00 cm/s MV A velocity: 41.40 cm/s MV E/A ratio:  2.92 Sherry [name] Electronically signed by Sherry [name] Signature Date/Time: 05/27/2021/5:10:26 PM    Final    CT HEAD CODE STROKE WO CONTRAST  Result Date: 05/24/2021 CLINICAL DATA:  Code stroke. 87 year old female with right side weakness and aphasia. EXAM: CT HEAD WITHOUT CONTRAST TECHNIQUE: Contiguous axial images were obtained from the base of the skull through the vertex without intravenous contrast. COMPARISON:  None. FINDINGS: Brain: At the time of this report only coronal and sagittal axial brain windows are provided, due to kyphosis the native image acquisition is coronal. No acute intracranial hemorrhage identified. No midline  shift, mass effect, or evidence of intracranial mass lesion. Patchy and confluent bilateral white matter hypodensity. No cortically based acute infarct identified. No ventriculomegaly. Bilateral basal ganglia vascular calcifications. Vascular: Calcified atherosclerosis at the skull base. No suspicious intracranial vascular hyperdensity. Skull: No acute osseous abnormality identified. Sinuses/Orbits: Visualized paranasal sinuses and mastoids are clear. Other: Mildly Disconjugate gaze.  Negative scalp. ASPECTS [facility] Stroke Program Early CT Score) Total score (0-10 with 10 being normal): 10 IMPRESSION: 1. No acute cortically based infarct or acute intracranial hemorrhage identified. 2. Moderate for age cerebral white matter changes. 3. Study discussed by telephone with Dr. Roland Rack on 05/24/2021 at 06:35. At 0629 hours. Electronically Signed   By: Sherry [name] M.D.   On: 05/24/2021 06:36   VAS US CAROTID  Result Date: 05/26/2021 Carotid Arterial Duplex Study Patient Name:  MOLLEY BONIFACIO [facility]  Date of Exam:   05/26/2021 Medical Rec #: [id number]        Accession #:    [id number] Date of Birth: [birth year]         Patient Gender: F Patient Age:   81 years Exam Location:  [facility] Procedure:      VAS US CAROTID Referring Phys: Beulah Gandy --------------------------------------------------------------------------------  Indications:       CVA, Speech disturbance and Weakness. Risk Factors:      Hypertension, hyperlipidemia, past history of smoking. Other Factors:     Atrial fibrillation. Comparison Study:  No prior study Performing Technologist: Sharion Dove RVS  Examination Guidelines: A complete evaluation includes B-mode imaging, spectral Doppler, color Doppler, and power Doppler as needed of all accessible portions of each vessel. Bilateral testing is considered an integral part of a complete examination. Limited examinations for reoccurring indications may be performed as noted.  Right Carotid  Findings: +----------+--------+--------+--------+------------------+--------+           PSV cm/sEDV cm/sStenosisPlaque DescriptionComments +----------+--------+--------+--------+------------------+--------+ CCA Prox  35      11              calcific                   +----------+--------+--------+--------+------------------+--------+ CCA Distal34      9               calcific                   +----------+--------+--------+--------+------------------+--------+ ICA Prox  74      17              calcific                   +----------+--------+--------+--------+------------------+--------+  ICA Distal85      17                                         +----------+--------+--------+--------+------------------+--------+ ECA       55      9                                          +----------+--------+--------+--------+------------------+--------+ +----------+--------+-------+--------+-------------------+           PSV cm/sEDV cmsDescribeArm Pressure (mmHG) +----------+--------+-------+--------+-------------------+ Subclavian50                                         +----------+--------+-------+--------+-------------------+ +---------+--------+--+--------+--+ VertebralPSV cm/s45EDV cm/s14 +---------+--------+--+--------+--+  Left Carotid Findings: +----------+--------+--------+--------+------------------+--------+           PSV cm/sEDV cm/sStenosisPlaque DescriptionComments +----------+--------+--------+--------+------------------+--------+ CCA Prox  47      10              heterogenous               +----------+--------+--------+--------+------------------+--------+ CCA Distal39      7               heterogenous               +----------+--------+--------+--------+------------------+--------+ ICA Prox  69      17              calcific                   +----------+--------+--------+--------+------------------+--------+ ICA Distal63       14                                         +----------+--------+--------+--------+------------------+--------+ ECA       65      6                                          +----------+--------+--------+--------+------------------+--------+ +----------+--------+--------+--------+-------------------+           PSV cm/sEDV cm/sDescribeArm Pressure (mmHG) +----------+--------+--------+--------+-------------------+ Subclavian109                                         +----------+--------+--------+--------+-------------------+ +---------+--------+--+--------+--+ VertebralPSV cm/s58EDV cm/s14 +---------+--------+--+--------+--+   Summary: Right Carotid: Velocities in the right ICA are consistent with a 1-39% stenosis. Left Carotid: Velocities in the left ICA are consistent with a 1-39% stenosis. Vertebrals:  Bilateral vertebral arteries demonstrate antegrade flow. Subclavians: Normal flow hemodynamics were seen in bilateral subclavian              arteries. *See table(s) above for measurements and observations.  Electronically signed by Sherry [name] on 05/26/2021 at 2:21:23 PM.    Final    CT ANGIO HEAD NECK W WO CM (CODE STROKE)  Result Date: 05/24/2021 CLINICAL DATA:  Code stroke. 87 year old female with right side weakness and aphasia. EXAM: CT ANGIOGRAPHY HEAD AND NECK TECHNIQUE: Multidetector CT  imaging of the head and neck was performed using the standard protocol during bolus administration of intravenous contrast. Multiplanar CT image reconstructions and MIPs were obtained to evaluate the vascular anatomy. Carotid stenosis measurements (when applicable) are obtained utilizing NASCET criteria, using the distal internal carotid diameter as the denominator. CONTRAST:  28mL OMNIPAQUE IOHEXOL 350 MG/ML SOLN COMPARISON:  Plain head CT 0616 hours today. FINDINGS: CTA NECK Skeleton: Absent dentition. Cervical spine degeneration with multilevel acquired ankylosis. No acute osseous  abnormality identified. Upper chest: Negative visible upper lungs. No superior mediastinal lymphadenopathy. Other neck: No acute finding in the neck. Aortic arch: Fusiform aneurysmal distal aortic arch (40 mm series 7, image 304) with abundant distal arch soft mural plaque or thrombus (series 7, image 303). Superimposed Calcified aortic atherosclerosis. Three vessel arch configuration. All of the anterior arch is not included. Right carotid system: Brachiocephalic artery and right CCA origin plaque without stenosis. Tortuous right CCA. Bulky calcified plaque at the right ICA origin and bulb results in stenosis numerically estimated up to 65-70 % with respect to the distal vessel on series 7, image 185. The right ICA remains patent to the skull base. Left carotid system: Negative visible left CCA origin. Tortuous proximal left CCA. Mild plaque proximal to the carotid bifurcation. But bulky calcified plaque at the bifurcation involving the left ICA origin with high-grade stenosis approaching a radiographic string sign on series 7, image 191. But the left ICA remains patent and is tortuous to the skull base without additional stenosis. Vertebral arteries: Bulky calcified plaque at the right subclavian artery origin but less than 50% stenosis. Calcified plaque at the right vertebral artery origin with moderate stenosis on series 7, image 256. The right vertebral is patent and tortuous in the neck, with no additional stenosis to the skull base. Bulky calcified plaque throughout the proximal left subclavian artery, and involving the left vertebral artery origin with moderate to severe stenosis on series 8, image 115. But the left vertebral remains patent, is tortuous in the neck but without additional stenosis to the skull base. CTA HEAD Suboptimal intracranial contrast bolus. Posterior circulation: Calcified plaque of the left vertebral artery as it crosses the dura but only mild stenosis suspected. No distal right  vertebral artery stenosis. Patent PICA origins, vertebrobasilar junction and basilar artery without stenosis. Patent SCA and PCA origins. Posterior communicating arteries are diminutive or absent. Bilateral PCA branches appear patent. Anterior circulation: Heavily calcified bilateral ICA siphons. But both siphons are patent to the carotid termini. Moderate cavernous and severe supraclinoid stenosis on the left (series 7 image 111. Similar severe right ICA siphon stenosis at the anterior genu (image 115). Patent carotid termini. Patent MCA and ACA origins. Proximal ACA branches appear within normal limits. Both MCA M1 segments are patent. No proximal left M2 branch occlusion is identified. Similar appearance of patent right MCA M1 and proximal right MCA branches. Venous sinuses: Patent. Anatomic variants: None. Review of the MIP images confirms the above findings IMPRESSION: 1. Suboptimal intracranial contrast bolus with no large vessel occlusion identified. 2. But positive for widespread bulky calcified atherosclerosis resulting in Severe Stenoses: - Left ICA origin stenoses approaches a RADIOGRAPHIC STRING SIGN. - Right ICA bulb 65-70% stenosis. - bilateral ICA siphon Severe stenosis (supraclinoid). - Left vertebral artery origin. 3. 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 Aortic Atherosclerosis (ICD10-I70.0). Preliminary results discussed by telephone with Dr. Roland Rack on 05/24/2021 at 0629 hours. Electronically Signed   By:  Sherry [name] M.D.   On: 05/24/2021 06:48   DG ESOPHAGUS W SINGLE CM (SOL OR THIN BA)  Result Date: 05/21/2021 CLINICAL DATA:  Dysphagia.  Difficulty swallowing foods. EXAM: ESOPHAGUS/BARIUM SWALLOW/TABLET STUDY TECHNIQUE: Combined double and single contrast examination was performed using effervescent crystals, high-density barium, and thin liquid barium. This exam was performed by Sherry Malta, Sherry [name], and was supervised and  interpreted by Sherry Manes, [name]. FLUOROSCOPY TIME:  Radiation Exposure Index (as provided by the fluoroscopic device): 5.6 mGy If the device does not provide the exposure index: Fluoroscopy Time:  1 minute and 36 seconds Number of Acquired Images:  8 COMPARISON:  NONE. FINDINGS: Swallowing: Appears normal. No vestibular penetration or aspiration seen. Pharynx: Unremarkable. Esophagus: Normal in course and in caliber. No mucosal abnormality or mass. No stricture. Esophageal motility: Dysmotility with proximal escape and distal tertiary contractions leading to barium stasis and more significant retention the ingested barium tablet. Hiatal Hernia: None. Gastroesophageal reflux: None visualized. Ingested 38mm barium tablet: Did not pass beyond the mid to distal esophagus during the study. Other: None. IMPRESSION: 1. Significant esophageal dysmotility with distal tertiary contractions and proximal escape. This led to the ingested barium tablet lodging at the level of the mid to distal esophagus. 2. No other abnormality. No mass, mucosal abnormality or stricture. No reflux documented during the exam. Electronically Signed   By: Sherry [name] M.D.   On: 05/21/2021 10:17      HISTORY OF PRESENT ILLNESS   Sherry [name] is a 87 y.o. female with a history of hypertension, hyperlipidemia, atrial fibrillation not on anticoagulation who presents with right-sided weakness that started this morning.  She called out for her daughter to help her get her to the bathroom and when her daughter responded she was in her normal state of health, speaking clearly, and stood up and got her self to her bedside commode.  She was doing her business, and then the daughter checked on her and found her to be slumped towards the right.  911 was called.   She was brought in as a code stroke and she was taken for a emergent CT/CTA which were negative for contraindication to TNK.  I discussed with her daughter Sherry [name], who is her power of attorney  by phone and advised her of the risks and benefits of IV thrombolytic therapy.  She agreed to proceed.  I asked her about whether she would want heroic life-saving measures and her daughter indicates that she would not want a breathing tube or chest compressions.  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 fib/flutter s/p IV TNK administration Code Stroke CT head No acute infarct or hemorrhage . ASPECTS 10.  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 The  left ventricle has normal function. The left ventricle has no regional  wall motion abnormalities. There is mild concentric left ventricular  hypertrophy Carotid US bilateral 1-39% carotid stenosis  LDL 135 HgbA1c 5.3 VTE prophylaxis - SCDs       Diet    DIET - DYS 1 Room service appropriate? Yes; Fluid consistency: Thin        aspirin 81 mg daily prior to admission, now on aspirin 81 mg daily  and clopidogrel 75 mg daily. For 3 months Therapy recommendations:  Home Health PT, OT pending Disposition:  pending   Hypertension Home meds:  none Stable Permissive hypertension (OK if <180/105) but gradually normalize in 5-7 days Long-term BP goal normotensive   Hyperlipidemia Home meds:  none LDL 135, goal < 70 High intensity statin deferred due to advanced age Continue statin at discharge   Atrial fibrillation Patient was not taking anticoagulation at home, likely due to advanced age Will discuss risks and benefits of anticoagulation with patient's family   Bilateral ICA stenosis Left ICA origin stenosis approaching radiographic string sign, right ICA bulb 65-70% stenosis seen on CTA head and neck that carotid  ultrasound shows only 1-49% stenosis Benefits of intervention unclear given patient's advanced age Check carotid US today     RN Pressure Injury Documentation: Pressure Injury 05/24/21 Perineum Medial Stage 1 -  Intact skin with non-blanchable redness of a localized area usually over a bony prominence. (Active)  05/24/21 0830  Location: Perineum  Location Orientation: Medial  Staging: Stage 1 -  Intact skin with non-blanchable redness of a localized area usually over a bony prominence.  Wound Description (Comments):   Present on Admission: Yes     DISCHARGE EXAM Blood pressure (!) 149/83, pulse 86, temperature 97.9 F (36.6 C), temperature source Oral, resp. rate 20, SpO2 98 %.  NEURO:  Mental Status: Awake and alert Speech/Language: speech is nonfluent and slightly hesitant but she is able to speak sentences she has very mild expressive and receptive aphasia. Can follow one step commands.  Difficulty with naming repetition   Cranial Nerves:  II: PERRL. Visual fields full.  III, IV, VI: EOMI. Eyelids elevate symmetrically.  V: Sensation is intact to light touch and symmetrical to face.  VII: Right sided facial droop  VIII: hearing intact to voice. IX, X: Palate elevates symmetrically. Phonation is normal.  [id number] shrug 5/5. XII: tongue is midline without fasciculations. Motor: Right sided weakness present right upper extremity 3/5 right lower extremity 4/5 strength with weakness of right grip and intrinsic hand muscles. Tone: is normal and bulk is normal Sensation- Decreased on right side Coordination: Unable to test  Gait- deferred    Discharge Diet       Diet   DIET - DYS 1 Room service appropriate? Yes with Assist; Fluid consistency: Thin   liquids  DISCHARGE PLAN Disposition:  rehab aspirin 81 mg daily and clopidogrel 75 mg daily for secondary stroke prevention for 3 months   Ongoing stroke risk factor control by Primary Care Physician at time of  discharge Follow-up PCP Sherry Dials, [name] in 2 weeks. Follow-up in [facility] in 4 weeks, office to schedule an appointment.    45 minutes were spent preparing discharge. I have personally obtained history,examined this patient, reviewed notes, independently viewed imaging studies, participated in medical decision making and plan of care.ROS completed by me personally and pertinent positives fully documented  I have made any additions or clarifications directly to the above note. Agree with note above.    Sherry Contras, [name] Medical Director [facility] Pager: [phone] 05/28/2021 4:00 PM

## 2021-05-28 NOTE — Progress Notes (Signed)
Occupational Therapy Treatment Patient Details Name: Sherry [name] MRN: [id number] DOB: 09-09-1919 Today's Date: 05/28/2021   History of present illness 87 yo female presenitng to ED on 11/5 with R sided weakness and speech difficulty. 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 PMH including a-fib, arthritis, HTN, OA, and pressure ulcer of sacral region.   OT comments  Co-treat with PT to address standing, mobility and transfers. Patient required frequent cues for posture while standing and during transfers. Patient was able to stand at sink for grooming with assistance for balance. Patient was able to reach to turn on water with RUE and wash hands before requiring rest break. Patient is making good progress with OT treatment.    Recommendations for follow up therapy are one component of a multi-disciplinary discharge planning process, led by the attending physician.  Recommendations may be updated based on patient status, additional functional criteria and insurance authorization.    Follow Up Recommendations  Acute inpatient rehab (3hours/day)    Assistance Recommended at Discharge Frequent or constant Supervision/Assistance  Equipment Recommendations  None recommended by OT    Recommendations for Other Services Rehab consult;PT consult;Speech consult    Precautions / Restrictions Precautions Precautions: Fall Restrictions Weight Bearing Restrictions: No       Mobility Bed Mobility Overal bed mobility: Needs Assistance Bed Mobility: Supine to Sit     Supine to sit: Mod assist;HOB elevated     General bed mobility comments: HOB elevated, pt able to move legs over EOB and scoot part way to EOB (getting feet closer to floor); ultimately needed assist to scoot pelvis and get feet on the floor    Transfers Overall transfer level: Needs assistance Equipment used: 2 person hand held assist Transfers: Bed to chair/wheelchair/BSC Sit to Stand: Mod assist;+2 physical  assistance Stand pivot transfers:  (via stedy) Step pivot transfers: Mod assist;+2 physical assistance       General transfer comment: plus 2 hand held assist with assitance to keep feet from sliding forward and frequent cues for posture     Balance Overall balance assessment: Needs assistance Sitting-balance support: Bilateral upper extremity supported;Feet supported Sitting balance-Leahy Scale: Poor Sitting balance - Comments: preference for posterior leaning Postural control: Posterior lean Standing balance support: Bilateral upper extremity supported Standing balance-Leahy Scale: Zero Standing balance comment: stood at sink to perform grooming tasks with assistance for balance                           ADL either performed or assessed with clinical judgement   ADL Overall ADL's : Needs assistance/impaired     Grooming: Wash/dry hands;Standing Grooming Details (indicate cue type and reason): required assistance of 2 for balance                               General ADL Comments: patient performed grooming standing at sink with assistance of 2 for balance and limited standing tolerance    Extremity/Trunk Assessment Upper Extremity Assessment Upper Extremity Assessment: Defer to OT evaluation            Vision       Perception     Praxis      Cognition Arousal/Alertness: Awake/alert Behavior During Therapy: Anxious Overall Cognitive Status: Impaired/Different from baseline Area of Impairment: Following commands;Problem solving;Attention  Current Attention Level: Sustained   Following Commands: Follows one step commands consistently     Problem Solving: Slow processing;Decreased initiation;Difficulty sequencing;Requires tactile cues;Requires verbal cues General Comments: patient required frequent cues for tasks, often asked what's next          Exercises     Shoulder Instructions       General  Comments      Pertinent Vitals/ Pain       Pain Assessment: Faces Faces Pain Scale: Hurts little more Pain Location: rt knee with attempts to flex knee prior to transfer Pain Descriptors / Indicators: Discomfort Pain Intervention(s): Limited activity within patient's tolerance;Repositioned  Home Living                                          Prior Functioning/Environment              Frequency  Min 2X/week        Progress Toward Goals  OT Goals(current goals can now be found in the care plan section)  Progress towards OT goals: Progressing toward goals  Acute Rehab OT Goals Patient Stated Goal: none stated OT Goal Formulation: With patient Time For Goal Achievement: 06/09/21 Potential to Achieve Goals: Good ADL Goals Pt Will Perform Grooming: sitting;with supervision Pt Will Perform Lower Body Dressing: with min assist;sit to/from stand Pt Will Transfer to Toilet: with min assist;ambulating;bedside commode Pt Will Perform Toileting - Clothing Manipulation and hygiene: with min assist;sit to/from stand;sitting/lateral leans Additional ADL Goal #1: Pt will attend to R visual field during ADLs with Min cues  Plan Discharge plan remains appropriate    Co-evaluation    PT/OT/SLP Co-Evaluation/Treatment: Yes Reason for Co-Treatment: Complexity of the patient's impairments (multi-system involvement);To address functional/ADL transfers   OT goals addressed during session: ADL's and self-care      AM-PAC OT "6 Clicks" Daily Activity     Outcome Measure   Help from another person eating meals?: A Lot Help from another person taking care of personal grooming?: A Lot Help from another person toileting, which includes using toliet, bedpan, or urinal?: A Lot Help from another person bathing (including washing, rinsing, drying)?: A Lot Help from another person to put on and taking off regular upper body clothing?: A Lot Help from another person to put  on and taking off regular lower body clothing?: A Lot 6 Click Score: 12    End of Session Equipment Utilized During Treatment: Gait belt  OT Visit Diagnosis: Unsteadiness on feet (R26.81);Other abnormalities of gait and mobility (R26.89);Muscle weakness (generalized) (M62.81);Pain   Activity Tolerance Patient tolerated treatment well   Patient Left in chair;with call bell/phone within reach;with chair alarm set;with family/visitor present   Nurse Communication Mobility status;Other (comment) (instructions on transfers)        Time: 9629-5284 OT Time Calculation (min): 28 min  Charges: OT General Charges $OT Visit: 1 Visit OT Treatments $Self Care/Home Management : 8-22 mins  Alfonse Flavors, OTA Acute Rehabilitation Services  Pager [phone] Office [phone]   Dewain Penning 05/28/2021, 1:38 PM

## 2021-05-28 NOTE — Progress Notes (Addendum)
Pt arrived on unit today at 1545 accompanied by her oldest son Greggory Stallion and her grandson. Pt A & O x4. Pt was given call bell and orientated to unit. Policies reviewed and pt agrees. Pt on room air.  Skin assessment with Carole Civil and again with Pam. Anal fissure noted, pt cleaned up and new dressing applied.   Lashone Stauber [place]

## 2021-05-28 NOTE — Progress Notes (Signed)
PMR Admission Coordinator Pre-Admission Assessment   Patient: Sherry [name] is an 87 y.o., female MRN: [id number] DOB: 18-May-1920 Height:   Weight:     Insurance Information HMO:     PPO:      PCP:      IPA:      80/20:      OTHER:  PRIMARY: medicare a/b      Policy#: [id number]      Subscriber: pt CM Name:       Phone#:      Fax#:  Pre-Cert#: verified Civil engineer, contracting:  Benefits:  Phone #:      Name:  Eff. Date: A 09/18/91, B 07/20/88     Deduct: $1556      Out of Pocket Max: n/a      Life Max: n/a CIR: 100%      SNF: 20 full days Outpatient: 80%     Co-Ins: 20% Home Health: 100%      Co-Pay DME: 80%     Co-Ins: 20% Providers:  SECONDARY: Medicaid      Policy#: [id number]     Phone#: [phone]   Financial Counselor:       Phone#:    The "Data Collection Information Summary" for patients in Inpatient Rehabilitation Facilities with attached "Privacy Act [place] Records" was provided and verbally reviewed with: Patient   Emergency Contact Information Contact Information       Name Relation Home Work Mobile    Sherry [name] Daughter [phone]        Sherry [name], Sherry [name]     [phone]    Sherry [name],Sherry [name] Other [phone]               Current Medical History  Patient Admitting Diagnosis: CVA   History of Present Illness: Pt is a 87 y/o female with history of HTN and atrial fib.  Presented to [facility] on 11/5 with R sided weakness. ED workup revealed L MCA likely due to afib/flutter.  CTA head/neck showed L ICA origin stenosis, R ICA bulb 65-70% stenosis.  Carotid US showed bilateral carotid stenosis 1-39%.  Permissive HTN normalize in 5-7 days.  Therapy evaluations completed and pt was recommended for CIR.    Complete NIHSS TOTAL: 15   Patient's medical record from [facility] has been reviewed by the rehabilitation admission coordinator and physician.   Past Medical History      Past Medical History:  Diagnosis Date   Acid reflux     Arthritis      Atrial fibrillation (HCC)     Chronic constipation     GERD (gastroesophageal reflux disease)     Hyperlipidemia     Hypertension     Osteoarthritis     Pressure ulcer of sacral region        Has the patient had major surgery during 100 days prior to admission? No   Family History   family history includes Hypertension in her mother; Other in her brother.   Current Medications   Current Facility-Administered Medications:    0.9 %  sodium chloride infusion, 50 mL/hr, Intravenous, Continuous, Greta Doom, MD, Last Rate: 50 mL/hr at 05/27/21 0157, 50 mL/hr at 05/27/21 0157   acetaminophen (TYLENOL) tablet 650 mg, 650 mg, Oral, Q4H PRN, 650 mg at 05/27/21 0658 **OR** acetaminophen (TYLENOL) 160 MG/5ML solution 650 mg, 650 mg, Per Tube, Q4H PRN **OR** acetaminophen (TYLENOL) suppository 650 mg, 650 mg, Rectal, Q4H PRN, Greta Doom, MD   acetaminophen-codeine (  TYLENOL #3) 300-30 MG per tablet 0.5 tablet, 0.5 tablet, Oral, QHS, Sethi, Pramod S, MD, 0.5 tablet at 05/26/21 2016   aspirin chewable tablet 81 mg, 81 mg, Oral, Daily, de [place], [place] E, NP, 81 mg at 05/27/21 0914   chlorhexidine (PERIDEX) 0.12 % solution 15 mL, 15 mL, Mouth Rinse, BID, Greta Doom, MD, 15 mL at 05/27/21 6415   clopidogrel (PLAVIX) tablet 75 mg, 75 mg, Oral, Daily, Beulah Gandy A, NP, 75 mg at 05/27/21 0914   diltiazem (CARDIZEM) tablet 30 mg, 30 mg, Oral, Q6H, Chand, Sudham, MD, 30 mg at 05/27/21 1219   enoxaparin (LOVENOX) injection 40 mg, 40 mg, Subcutaneous, q1800, Chand, Currie Paris, MD, 40 mg at 05/26/21 1825   labetalol (NORMODYNE) injection 10 mg, 10 mg, Intravenous, Q2H PRN, de Yolanda Manges, Cortney E, NP   MEDLINE mouth rinse, 15 mL, Mouth Rinse, q12n4p, Greta Doom, MD, 15 mL at 05/26/21 1158   [DISCONTINUED] labetalol (NORMODYNE) injection 10 mg, 10 mg, Intravenous, Once PRN **AND** nicardipine (CARDENE) 20m in 0.86% saline 204mIV infusion (0.1 mg/ml), 0-15  mg/hr, Intravenous, Continuous PRN, KiGreta DoomMD   pantoprazole (PROTONIX) EC tablet 40 mg, 40 mg, Oral, QHS, Sethi, Pramod S, MD, 40 mg at 05/26/21 2135   sodium chloride flush (NS) 0.9 % injection 3 mL, 3 mL, Intravenous, Once, Rancour, Stephen, MD   Patients Current Diet:  Diet Order                  DIET - DYS 1 Room service appropriate? Yes; Fluid consistency: Thin  Diet effective now                         Precautions / Restrictions Precautions Precautions: Fall Restrictions Weight Bearing Restrictions: No    Has the patient had 2 or more falls or a fall with injury in the past year? No   Prior Activity Level Household: has been essentially homebound, but recently got a ramp, limited community ambulation with RW   Prior Functional Level Self Care: Did the patient need help bathing, dressing, using the toilet or eating? Needed some help   Indoor Mobility: Did the patient need assistance with walking from room to room (with or without device)? Needed some help (supervision)   Stairs: Did the patient need assistance with internal or external stairs (with or without device)? Needed some help   Functional Cognition: Did the patient need help planning regular tasks such as shopping or remembering to take medications? Dependent   Patient Information Are you of Hispanic, Latino/a,or Spanish origin?: A. No, not of Hispanic, Latino/a, or Spanish origin What is your race?: B. Black or African American Do you need or want an interpreter to communicate with a doctor or health care staff?: 0. No   Patient's Response To:  Health Literacy and Transportation Is the patient able to respond to health literacy and transportation needs?: No Health Literacy - How often do you need to have someone help you when you read instructions, pamphlets, or other written material from your doctor or pharmacy?: Patient unable to respond In the past 12 months, has lack of  transportation kept you from medical appointments or from getting medications?: No In the past 12 months, has lack of transportation kept you from meetings, work, or from getting things needed for daily living?: No   Home Assistive Devices / Equipment Home Equipment: RoConservation officer, nature2 wheels), BSC/3in1, Wheelchair - manual, Grab bars -  tub/shower, Grab bars - toilet   Prior Device Use: Indicate devices/aids used by the patient prior to current illness, exacerbation or injury? Walker   Current Functional Level Cognition   Arousal/Alertness: Awake/alert Overall Cognitive Status: Impaired/Different from baseline Difficult to assess due to: Impaired communication Current Attention Level: Sustained Orientation Level: Oriented X4 Following Commands: Follows one step commands with increased time General Comments: pt asks multiple times "so what are we doing?" during mobility tasks after cues have already been administered. Pt is difficult to understand at times due to no dentures in place. Pt requires step-by-step cues for sequencing tasks. Behaviors: Perseveration    Extremity Assessment (includes Sensation/Coordination)   Upper Extremity Assessment: Overall WFL for tasks assessed  Lower Extremity Assessment: Generalized weakness     ADLs         Mobility   Overal bed mobility: Needs Assistance Bed Mobility: Supine to Sit Supine to sit: Max assist, +2 for safety/equipment, +2 for physical assistance Sit to supine: Max assist, +2 for physical assistance General bed mobility comments: Max +2 for trunk elevation, LE lowering over EOB, and scooting to EOB with use of bed pad.     Transfers   Overall transfer level: Needs assistance Equipment used: 2 person hand held assist Transfers: Bed to chair/wheelchair/BSC Sit to Stand: Max assist Bed to/from chair/wheelchair/BSC transfer type:: Squat pivot Squat pivot transfers: Max assist, +2 physical assistance General transfer comment: max +2  for initial power up and rise, pt not able to progress to full standing so squat pivot to recliner on L initiated. Assist for RLE guarding, hip translation, boost back in chair.     Ambulation / Gait / Stairs / Wheelchair Mobility   Ambulation/Gait General Gait Details: unable today     Posture / Balance Dynamic Sitting Balance Sitting balance - Comments: preference for posterior leaning Balance Overall balance assessment: Needs assistance Sitting-balance support: Bilateral upper extremity supported, Feet supported Sitting balance-Leahy Scale: Poor Sitting balance - Comments: preference for posterior leaning Postural control: Posterior lean Standing balance support: Bilateral upper extremity supported Standing balance-Leahy Scale: Zero Standing balance comment: max +2     Special needs/care consideration N/a    Previous Home Environment (from acute therapy documentation) Living Arrangements: Children  Lives With: Son, Daughter Available Help at Discharge: Family, Available 24 hours/day Type of Home: House Home Layout: One level Home Access: Ramped entrance Bathroom Shower/Tub: Multimedia programmer: Standard Additional Comments: Aide comes mon, wednesday, friday for three hours   Discharge Living Setting Plans for Discharge Living Setting: Lives with (comment) (daughter;son) Type of Home at Discharge: House Discharge Home Layout: One level Discharge Home Access: [place] entrance Discharge Bathroom Shower/Tub: Walk-in shower Discharge Bathroom Toilet: Standard Discharge Bathroom Accessibility: Yes How Accessible: Accessible via walker Does the patient have any problems obtaining your medications?: No   Social/Family/Support Systems Anticipated Caregiver: daughter, Sherry [name] Anticipated Caregiver's Contact Information: [phone] Ability/Limitations of Caregiver: can provide mod assist for ADLs and min assist for mobility Caregiver Availability: 24/7 Discharge Plan  Discussed with Primary Caregiver: Yes Is Caregiver In Agreement with Plan?: Yes Does Caregiver/Family have Issues with Lodging/Transportation while Pt is in Rehab?: No   Goals Patient/Family Goal for Rehab: PT/OT min assist, SLP min to mod assist Expected length of stay: 14-18 days Pt/Family Agrees to Admission and willing to participate: Yes Program Orientation Provided & Reviewed with Pt/Caregiver Including Roles  & Responsibilities: Yes  Barriers to Discharge: Decreased caregiver support (if fails to progress to a level family can  provide)   Decrease burden of Care through IP rehab admission: n/a   Possible need for SNF placement upon discharge: Not anticipated, but possible if patient does reach a level family can support at home.    Patient Condition: I have reviewed medical records from [facility], spoken with CM, and patient, son, and daughter. I met with patient at the bedside for inpatient rehabilitation assessment.  Patient will benefit from ongoing PT, OT, and SLP, can actively participate in 3 hours of therapy a day 5 days of the week, and can make measurable gains during the admission.  Patient will also benefit from the coordinated team approach during an Inpatient Acute Rehabilitation admission.  The patient will receive intensive therapy as well as Rehabilitation physician, nursing, social worker, and care management interventions.  Due to safety, skin/wound care, disease management, medication administration, pain management, and patient education the patient requires 24 hour a day rehabilitation nursing.  The patient is currently max +1 with mobility and basic ADLs.  Discharge setting and therapy post discharge at home with home health is anticipated.  Patient has agreed to participate in the Acute Inpatient Rehabilitation Program and will admit today.   Preadmission Screen Completed By:  Michel Santee, PT, DPT 05/27/2021 1:02  PM ______________________________________________________________________   Discussed status with Dr. Ranell Patrick on 05/28/21  at 10:17 AM  and received approval for admission today.   Admission Coordinator:  Michel Santee, PT, DPT time 10:17 AM Sudie Grumbling 05/28/21     Assessment/Plan: Diagnosis: CVA Does the need for close, 24 hr/day Medical supervision in concert with the patient's rehab needs make it unreasonable for this patient to be served in a less intensive setting? Yes Co-Morbidities requiring supervision/potential complications: atrial fibrillation, pelvic pain, pressure injury, slow transit constipation, hypokalemia Due to bladder management, bowel management, safety, skin/wound care, disease management, medication administration, pain management, and patient education, does the patient require 24 hr/day rehab nursing? Yes Does the patient require coordinated care of a physician, rehab nurse, PT, OT, and SLP to address physical and functional deficits in the context of the above medical diagnosis(es)? Yes Addressing deficits in the following areas: balance, endurance, locomotion, strength, transferring, bowel/bladder control, bathing, dressing, feeding, grooming, toileting, swallowing, and psychosocial support Can the patient actively participate in an intensive therapy program of at least 3 hrs of therapy 5 days a week? Yes The potential for patient to make measurable gains while on inpatient rehab is excellent Anticipated functional outcomes upon discharge from inpatient rehab: min assist PT, min assist OT, min assist SLP Estimated rehab length of stay to reach the above functional goals is: 2-3 weeks Anticipated discharge destination: Home 10. Overall Rehab/Functional Prognosis: excellent     MD Signature: Leeroy Cha, MD

## 2021-05-29 DIAGNOSIS — I63512 Cerebral infarction due to unspecified occlusion or stenosis of left middle cerebral artery: Secondary | ICD-10-CM | POA: Diagnosis not present

## 2021-05-29 LAB — COMPREHENSIVE METABOLIC PANEL
ALT: 9 U/L (ref 0–44)
AST: 21 U/L (ref 15–41)
Albumin: 2.6 g/dL — ABNORMAL LOW (ref 3.5–5.0)
Alkaline Phosphatase: 102 U/L (ref 38–126)
Anion gap: 8 (ref 5–15)
BUN: 25 mg/dL — ABNORMAL HIGH (ref 8–23)
CO2: 24 mmol/L (ref 22–32)
Calcium: 9 mg/dL (ref 8.9–10.3)
Chloride: 104 mmol/L (ref 98–111)
Creatinine, Ser: 0.87 mg/dL (ref 0.44–1.00)
GFR, Estimated: 60 mL/min — ABNORMAL LOW (ref >60)
Glucose, Bld: 104 mg/dL — ABNORMAL HIGH (ref 70–99)
Potassium: 4.2 mmol/L (ref 3.5–5.1)
Sodium: 136 mmol/L (ref 135–145)
Total Bilirubin: 0.5 mg/dL (ref 0.3–1.2)
Total Protein: 6.2 g/dL — ABNORMAL LOW (ref 6.5–8.1)

## 2021-05-29 LAB — CBC WITH DIFFERENTIAL/PLATELET
Abs Immature Granulocytes: 0.02 10*3/uL (ref 0.00–0.07)
Basophils Absolute: 0 10*3/uL (ref 0.0–0.1)
Basophils Relative: 1 %
Eosinophils Absolute: 0.1 10*3/uL (ref 0.0–0.5)
Eosinophils Relative: 3 %
HCT: 37.7 % (ref 36.0–46.0)
Hemoglobin: 12.3 g/dL (ref 12.0–15.0)
Immature Granulocytes: 1 %
Lymphocytes Relative: 28 %
Lymphs Abs: 1.2 10*3/uL (ref 0.7–4.0)
MCH: 27.3 pg (ref 26.0–34.0)
MCHC: 32.6 g/dL (ref 30.0–36.0)
MCV: 83.8 fL (ref 80.0–100.0)
Monocytes Absolute: 0.5 10*3/uL (ref 0.1–1.0)
Monocytes Relative: 13 %
Neutro Abs: 2.3 10*3/uL (ref 1.7–7.7)
Neutrophils Relative %: 54 %
Platelets: 258 10*3/uL (ref 150–400)
RBC: 4.5 MIL/uL (ref 3.87–5.11)
RDW: 16.3 % — ABNORMAL HIGH (ref 11.5–15.5)
WBC: 4.2 10*3/uL (ref 4.0–10.5)
nRBC: 0 % (ref 0.0–0.2)

## 2021-05-29 LAB — MAGNESIUM: Magnesium: 1.8 mg/dL (ref 1.7–2.4)

## 2021-05-29 LAB — VITAMIN D 25 HYDROXY (VIT D DEFICIENCY, FRACTURES): Vit D, 25-Hydroxy: 43.9 ng/mL (ref 30–100)

## 2021-05-29 MED ORDER — B COMPLEX-C PO TABS
1.0000 | ORAL_TABLET | Freq: Every day | ORAL | Status: DC
  Administered 2021-05-29 – 2021-06-11 (×14): 1 via ORAL
  Filled 2021-05-29 (×14): qty 1

## 2021-05-29 MED ORDER — MAGNESIUM SULFATE 2 GM/50ML IV SOLN
2.0000 g | Freq: Once | INTRAVENOUS | Status: AC
  Administered 2021-05-29: 2 g via INTRAVENOUS
  Filled 2021-05-29: qty 50

## 2021-05-29 NOTE — Plan of Care (Signed)
  Problem: RH Swallowing Goal: LTG Patient will consume least restrictive diet using compensatory strategies with assistance (SLP) Description: LTG:  Patient will consume least restrictive diet using compensatory strategies with assistance (SLP) Flowsheets (Taken 05/29/2021 1707) LTG: Pt Patient will consume least restrictive diet using compensatory strategies with assistance of (SLP): Supervision   Problem: RH Expression Communication Goal: LTG Patient will express needs/wants via multi-modal(SLP) Description: LTG:  Patient will express needs/wants via multi-modal communication (gestures/written, etc) with cues (SLP) Flowsheets (Taken 05/29/2021 1707) LTG: Patient will express needs/wants via multimodal communication (gestures/written, etc) with cueing (SLP): Moderate Assistance - Patient 50 - 74% Goal: LTG Patient will verbally express basic/complex needs(SLP) Description: LTG:  Patient will verbally express basic/complex needs, wants or ideas with cues  (SLP) Flowsheets (Taken 05/29/2021 1707) LTG: Patient will verbally express basic/complex needs, wants or ideas (SLP): Moderate Assistance - Patient 50 - 74% Goal: LTG Patient will increase speech intelligibility (SLP) Description: LTG: Patient will increase speech intelligibility at word/phrase/conversation level with cues, % of the time (SLP) Flowsheets (Taken 05/29/2021 1707) LTG: Patient will increase speech intelligibility (SLP): Moderate Assistance - Patient 50 - 74% Level: Phrase   Problem: RH Awareness Goal: LTG: Patient will demonstrate awareness during functional activites type of (SLP) Description: LTG: Patient will demonstrate awareness during functional activites type of (SLP) Flowsheets (Taken 05/29/2021 1707) Patient will demonstrate during cognitive/linguistic activities awareness type of: Emergent LTG: Patient will demonstrate awareness during cognitive/linguistic activities with assistance of (SLP): Moderate Assistance -  Patient 50 - 74%

## 2021-05-29 NOTE — Progress Notes (Signed)
Patient ID: Sherry [name], female   DOB: [birth year], 87 y.o.   MRN: [id number] Met with the patient to introduce self and role. Patient noted her daughter stays with her and manages care and meds and meals. Has 4 people who come into the home to help her out.  Reported pelvic/groin pain and nurse had given medication. Patient is incontinent of bladder and has constipation. Educational notebook in room for daughter; not yet arrived. Follow up with family on secondary risk management. Collaborate with the team to facilitate smooth discharge. Margarito Liner

## 2021-05-29 NOTE — Progress Notes (Signed)
   05/29/21 1240  Clinical Encounter Type  Visited With Patient and family together  Visit Type Follow-up;Spiritual support  Referral From Nurse  Consult/Referral To Chaplain  Chaplain Tery Sanfilippo responded to the consult request for prayer. The attending nurse and the patient's daughter, Karoline Caldwell, were at the patient's bedside. Chaplain Manson Passey noticed the patient had a fuzzy blue blanket and offered her a prayer shawl. Per Angie, blue is the patient's favorite color. The chaplain made a return visit, and the patient appreciated the prayer shawl. Angie spoke of the patient's love of taking care of her family. Annita Brod ended the visit with prayer. This note was prepared by Deneen Harts, M.Div..  For questions please contact by phone [phone].

## 2021-05-29 NOTE — Progress Notes (Signed)
PROGRESS NOTE   Subjective/Complaints: IV magnesium today She notes she doesn't feel well but cannot state why Hgb improved Working with Fayrene Fearing and he notes she needs a lot of assistance  ROS: +malaise   Objective:   No results found. Recent Labs    05/28/21 0226 05/29/21 0527  WBC 4.5 4.2  HGB 11.2* 12.3  HCT 33.7* 37.7  PLT 214 258   Recent Labs    05/28/21 0226 05/29/21 0527  NA 135 136  K 3.4* 4.2  CL 104 104  CO2 21* 24  GLUCOSE 87 104*  BUN 13 25*  CREATININE 0.90 0.87  CALCIUM 8.6* 9.0    Intake/Output Summary (Last 24 hours) at 05/29/2021 4034 Last data filed at 05/29/2021 0751 Gross per 24 hour  Intake 738 ml  Output --  Net 738 ml     Pressure Injury 05/24/21 Perineum Medial Stage 1 -  Intact skin with non-blanchable redness of a localized area usually over a bony prominence. (Active)  05/24/21 0830  Location: Perineum  Location Orientation: Medial  Staging: Stage 1 -  Intact skin with non-blanchable redness of a localized area usually over a bony prominence.  Wound Description (Comments):   Present on Admission: Yes    Physical Exam: Vital Signs Blood pressure (!) 149/70, pulse 69, temperature 98 F (36.7 C), temperature source Oral, resp. rate 14, height 5\' 1"  (1.549 m), weight 52.4 kg, SpO2 99 %. Gen: no distress, normal appearing HEENT: oral mucosa pink and moist, NCAT Cardio: Reg rate Chest: normal effort, normal rate of breathing Abd: soft, non-distended Ext: no edema Psych: pleasant, normal affect Skin: Keratotic horn on left shin.   Neuro: Decreased hearing. Oriented to self. Able to state age as 51 and DOB as [birth year]. Delay in processing but she was able simple motor commands with verbal and tactile cues.  Right sided weakness noted --unable to perform MMT due to cognitive issues and reports of being cold/wanted to be covered.     Assessment/Plan: 1. Functional deficits which  require 3+ hours per day of interdisciplinary therapy in a comprehensive inpatient rehab setting. Physiatrist is providing close team supervision and 24 hour management of active medical problems listed below. Physiatrist and rehab team continue to assess barriers to discharge/monitor patient progress toward functional and medical goals  Care Tool:  Bathing  Bathing activity did not occur: Safety/medical concerns           Bathing assist       Upper Body Dressing/Undressing Upper body dressing        Upper body assist      Lower Body Dressing/Undressing Lower body dressing            Lower body assist       Toileting Toileting    Toileting assist       Transfers Chair/bed transfer  Transfers assist           Locomotion Ambulation   Ambulation assist      Assist level: Maximal Assistance - Patient 25 - 49%       Walk 10 feet activity   Assist  Walk 50 feet activity   Assist           Walk 150 feet activity   Assist           Walk 10 feet on uneven surface  activity   Assist           Wheelchair     Assist Is the patient using a wheelchair?: No Type of Wheelchair:  (does not have one in room)           Wheelchair 50 feet with 2 turns activity    Assist            Wheelchair 150 feet activity     Assist          Blood pressure (!) 149/70, pulse 69, temperature 98 F (36.7 C), temperature source Oral, resp. rate 14, height 5\' 1"  (1.549 m), weight 52.4 kg, SpO2 99 %.  Medical Problem List and Plan: 1.  Left MCA CVA             -patient may shower             -ELOS/Goals: 2-3 weeks MinA             -check Vitamin D level today, pending  Initial CIR evals today  -B/C vitamin complex started for stroke recovery 2.  Antithrombotics: -DVT/anticoagulation:  Pharmaceutical: Lovenox             -antiplatelet therapy: DAPT X 3 months-->to follow up with neurology for further  input.  3. Pain: continue Tylenol #3 1/2 tab at bedtime daily             --tylenol prn  4. Mood: LCSW to follow for evaluation and support.              -antipsychotic agents: N/A 5. Neuropsych: This patient is not fully capable of making decisions on her own behalf. 6. Skin/Wound Care: Pressure relief measures.  --Turn every 2 hours while in bed.              --foam dressing for protection.  7. Fluids/Electrolytes/Nutrition: Monitor I/O. Check CMET in am. 8. A fib: Monitor HR TID--continue Cardizem 30 mg TID. Supplement IV magnesium for Mag <2, 1.8 today.  9. Chronic dysphagia: Will continue home diet of ensures with pureed food.   10. Persistent Hypokalemia: Will supplement today and tomorrow             --check CMET/mg level in am. 11. Chronic constipation:  IV mag supplement today should help.  12. Hypertension: systolic blood pressure elevated to 140s- IV mag supplement today should help    LOS: 1 days A FACE TO FACE EVALUATION WAS PERFORMED  Martha Clan P Deveron Shamoon 05/29/2021, 9:42 AM

## 2021-05-29 NOTE — Plan of Care (Signed)
  Problem: RH Balance Goal: LTG Patient will maintain dynamic sitting balance (PT) Description: LTG:  Patient will maintain dynamic sitting balance with assistance during mobility activities (PT) Flowsheets (Taken 05/29/2021 1209) LTG: Pt will maintain dynamic sitting balance during mobility activities with:: Minimal Assistance - Patient > 75% Goal: LTG Patient will maintain dynamic standing balance (PT) Description: LTG:  Patient will maintain dynamic standing balance with assistance during mobility activities (PT) Flowsheets (Taken 05/29/2021 1209) LTG: Pt will maintain dynamic standing balance during mobility activities with:: Moderate Assistance - Patient 50 - 74%   Problem: Sit to Stand Goal: LTG:  Patient will perform sit to stand with assistance level (PT) Description: LTG:  Patient will perform sit to stand with assistance level (PT) Flowsheets (Taken 05/29/2021 1209) LTG: PT will perform sit to stand in preparation for functional mobility with assistance level: Moderate Assistance - Patient 50 - 74%   Problem: RH Bed Mobility Goal: LTG Patient will perform bed mobility with assist (PT) Description: LTG: Patient will perform bed mobility with assistance, with/without cues (PT). Flowsheets (Taken 05/29/2021 1209) LTG: Pt will perform bed mobility with assistance level of: Moderate Assistance - Patient 50 - 74%   Problem: RH Bed to Chair Transfers Goal: LTG Patient will perform bed/chair transfers w/assist (PT) Description: LTG: Patient will perform bed to chair transfers with assistance (PT). Flowsheets (Taken 05/29/2021 1209) LTG: Pt will perform Bed to Chair Transfers with assistance level: Moderate Assistance - Patient 50 - 74%   Problem: RH Ambulation Goal: LTG Patient will ambulate in home environment (PT) Description: LTG: Patient will ambulate in home environment, # of feet with assistance (PT). Flowsheets (Taken 05/29/2021 1209) LTG: Pt will ambulate in home environ   assist needed:: Moderate Assistance - Patient 50 - 74% LTG: Ambulation distance in home environment: 10   Problem: RH Wheelchair Mobility Goal: LTG Patient will propel w/c in home environment (PT) Description: LTG: Patient will propel wheelchair in home environment, # of feet with assistance (PT). Flowsheets (Taken 05/29/2021 1209) LTG: Pt will propel w/c in home environ  assist needed:: Minimal Assistance - Patient > 75% Distance: wheelchair distance in controlled environment: 50

## 2021-05-29 NOTE — Progress Notes (Signed)
Inpatient Rehabilitation  Patient information reviewed and entered into eRehab system by Dashae Wilcher M. Naelle Diegel, M.A., CCC/SLP, PPS Coordinator.  Information including medical coding, functional ability and quality indicators will be reviewed and updated through discharge.    

## 2021-05-29 NOTE — Evaluation (Signed)
Speech Language Pathology Assessment and Plan  Patient Details  Name: Sherry [name] MRN: [id number] Date of Birth: Nov 28, 1919  SLP Diagnosis: Aphasia;Dysarthria;Cognitive Impairments;Dysphagia  Rehab Potential: Fair ELOS: ~2 weeks   Today's Date: 05/29/2021 SLP Individual Time: 1300-1400 SLP Individual Time Calculation (min): 60 min  Hospital Problem: Principal Problem:   Acute ischemic left middle cerebral artery (MCA) stroke (HCC)  Past Medical History:  Past Medical History:  Diagnosis Date   Acid reflux    Arthritis    Atrial fibrillation (HCC)    Chronic constipation    GERD (gastroesophageal reflux disease)    Hyperlipidemia    Hypertension    Osteoarthritis    Pressure ulcer of sacral region    Past Surgical History:  Past Surgical History:  Procedure Laterality Date   NO PAST SURGERIES      Assessment / Plan / Recommendation Clinical Impression Pt seen for cognitive-linguistic and speech/language evaluation. Speech was dysarthric and perceived as 75% intelligible at word level, 50-75% intelligible at phrase/sentence level and complicated by right sided facial weakness and edentulous status. This improved when patient was asked to repeat. Pt also presented with expressive>receptive aphasia and presented with difficulty with verbal repetition (word level), phonemic and semantic paraphasias, answering complex yes/no questions and following 2-step commands. Pt did well with answering biographical questions, answering basic yes/no questions, object naming, following 1-step commands, and participating in simple conversational exchange with daughter. Pt was observed communicating basic needs therapist, daughter, and nsg staff. Pt has baseline dementia associated with advanced age. Per discussion with daughter, pt received full assist with ADLs and iADLs and received 24/hour support at prior level.  Pt seen for clinical swallow evaluation. Oral mechanism exam revealed  significant right sided weakness impacting labial seal resulting in anterior spillage with cup sips. This improved with straw sips. Pt consumed dys 1/pureed solids with timely and effective oral prep, transit, and clearance. Pt did not exhibit overt s/sx of aspiration with Dys 1 textures and thin liquids and maintained clear vocal quality throughout evaluation. Daughter Sherry [name] reported pt has consumed pureed solids for the past 6+ months. Family provided total A for feeding PTA. Recommend Dysphagia 1 diet, thin liquids, and meds whole in puree or crush as needed. Recommend follow up at least 1x to assess PO tolerance and education on oral strategies prior to signing off on swallow function due to pt being near baseline and without increased concern for aspiration.  Pt may benefit from skilled ST intervention to maximize functional communication and monitor diet tolerance and safety prior to discharge. Suspect progress may be slow-to-limited considering baseline level of functioning.   Skilled Therapeutic Interventions          Pt participated in clinical swallow evaluation and informal speech/language and cognitive-linguistic evaluation. Please see above  SLP Assessment  Patient will need skilled Speech Lanaguage Pathology Services during CIR admission    Recommendations  SLP Diet Recommendations: Dysphagia 1 (Puree);Thin Liquid Administration via: Straw Medication Administration: Whole meds with puree Supervision: Staff to assist with self feeding;Full supervision/cueing for compensatory strategies Compensations: Slow rate;Small sips/bites;Follow solids with liquid;Monitor for anterior loss Postural Changes and/or Swallow Maneuvers: Seated upright 90 degrees;Upright 30-60 min after meal Oral Care Recommendations: Oral care BID Patient destination: Home Follow up Recommendations: 24 hour supervision/assistance;Other (comment) (TBD)    SLP Frequency 3 to 5 out of 7 days   SLP Duration  SLP  Intensity  SLP Treatment/Interventions ~2 weeks  Minumum of 1-2 x/day, 30 to 90 minutes  Speech/Language facilitation;Cueing hierarchy;Dysphagia/aspiration precaution training;Patient/family education;Therapeutic Activities    Pain Pain Assessment Pain Score: 1   Prior Functioning Cognitive/Linguistic Baseline: Baseline deficits Baseline deficit details: dementia; full assist from family for IADLs Type of Home: House  Lives With: Son;Daughter Available Help at Discharge: Family;Available 24 hours/day Vocation: Retired  Programmer, systems Overall Cognitive Status: Impaired/Different from baseline Arousal/Alertness:  (drowsy, in and out of sleep) Orientation Level: Oriented to person;Oriented to place;Disoriented to situation Year: Other (Comment) (2002) Month: August Day of Week: Incorrect Attention: Focused;Sustained Focused Attention: Appears intact Sustained Attention: Impaired Sustained Attention Impairment: Functional basic;Verbal basic Memory: Impaired Memory Impairment: Storage deficit;Retrieval deficit Immediate Memory Recall:  (unable to recall any of the words) Memory Recall Sock: Not able to recall Memory Recall Blue: Not able to recall Memory Recall Bed: Not able to recall Awareness: Impaired Awareness Impairment: Emergent impairment Problem Solving: Impaired Problem Solving Impairment: Functional basic;Verbal basic Safety/Judgment: Impaired Comments: cognitive assessment limited secondary to aphasia  Comprehension Auditory Comprehension Overall Auditory Comprehension: Impaired Yes/No Questions: Impaired Complex Questions: 25-49% accurate Commands: Impaired One Step Basic Commands: 75-100% accurate Two Step Basic Commands: 50-74% accurate Multistep Basic Commands: 25-49% accurate Conversation: Simple Interfering Components: Attention;Processing speed;Working Field seismologist: Astronomer: Not tested Reading Comprehension Reading Status: Not tested Expression Expression Primary Mode of Expression: Verbal Verbal Expression Overall Verbal Expression: Impaired Initiation: No impairment Automatic Speech: Name;Social Response Level of Generative/Spontaneous Verbalization: Sentence Repetition: Impaired Level of Impairment: Phrase level;Word level;Sentence level Naming: Impairment Responsive: 76-100% accurate Convergent: Not tested Divergent: Not tested Verbal Errors: Semantic paraphasias;Phonemic paraphasias;Other (comment) (not consistently aware of errors) Effective Techniques: Semantic cues Written Expression Dominant Hand: Right Written Expression: Not tested Oral Motor Oral Motor/Sensory Function Overall Oral Motor/Sensory Function: Moderate impairment Facial ROM: Reduced right Facial Symmetry: Abnormal symmetry right Facial Strength: Reduced right Lingual ROM: Reduced right Lingual Symmetry: Abnormal symmetry right Lingual Strength: Reduced;Suspected CN XII (hypoglossal) dysfunction Velum: Within Functional Limits Mandible: Impaired Motor Speech Overall Motor Speech: Impaired Respiration: Within functional limits Phonation: Low vocal intensity Resonance: Within functional limits Articulation: Impaired Intelligibility: Intelligibility reduced Word: 75-100% accurate Phrase: 75-100% accurate Sentence: 50-74% accurate Conversation: Not tested Motor Planning: Witnin functional limits Motor Speech Errors: Not applicable Interfering Components: Inadequate dentition  Care Tool Care Tool Cognition Ability to hear (with hearing aid or hearing appliances if normally used Ability to hear (with hearing aid or hearing appliances if normally used): 1. Minimal difficulty - difficulty in some environments (e.g. when person speaks softly or setting is noisy)   Expression of Ideas and Wants Expression of Ideas and Wants: 3.  Some difficulty - exhibits some difficulty with expressing needs and ideas (e.g, some words or finishing thoughts) or speech is not clear   Understanding Verbal and Non-Verbal Content Understanding Verbal and Non-Verbal Content: 3. Usually understands - understands most conversations, but misses some part/intent of message. Requires cues at times to understand  Memory/Recall Ability Memory/Recall Ability : That he or she is in a hospital/hospital unit   PMSV Assessment  PMSV Trial Intelligibility: Intelligibility reduced Word: 75-100% accurate Phrase: 75-100% accurate Sentence: 50-74% accurate Conversation: Not tested  Bedside Swallowing Assessment General Date of Onset: 05/24/21 Previous Swallow Assessment: 05/24/2021 Diet Prior to this Study: Dysphagia 1 (puree);Thin liquids Temperature Spikes Noted: No Respiratory Status: Room air History of Recent Intubation: No Behavior/Cognition: Cooperative;Pleasant mood;Alert Oral Cavity - Dentition: Edentulous Self-Feeding Abilities: Total assist Patient Positioning: Upright in bed Baseline Vocal Quality: Low vocal intensity Volitional Cough: Weak Volitional  Swallow: Able to elicit  Oral Care Assessment Does patient have any of the following "high(er) risk" factors?: None of the above Does patient have any of the following "at risk" factors?: None of the above Ice Chips Ice chips: Not tested Thin Liquid Thin Liquid: Within functional limits Presentation: Straw Oral Phase Functional Implications: Right anterior spillage Nectar Thick Nectar Thick Liquid: Not tested Honey Thick Honey Thick Liquid: Not tested Puree Puree: Within functional limits Presentation: Spoon Solid Solid: Not tested BSE Assessment Risk for Aspiration Impact on safety and function: Mild aspiration risk Other Related Risk Factors: History of GERD  Short Term Goals: Week 1: SLP Short Term Goal 1 (Week 1): Pt will consume current diet without overt s/sx of  aspiration with min A verbal cues for use of swallowing compensatory strategies SLP Short Term Goal 2 (Week 1): Patient will utilize speech intelligiblity strategies at the word level with mod-to-max A verbal cues to achieve 75-90% intelligiblity SLP Short Term Goal 3 (Week 1): Pt will utilize call bell to request assistance with mod A verbal cues in 50% of observable opportunities SLP Short Term Goal 4 (Week 1): Pt will communicate functional needs to staff with mod-to-max A verbal cues SLP Short Term Goal 5 (Week 1): Pt will demonstrate awareness of speech errors during 50% of occasions with mod-to-max A verbal cues  Refer to Care Plan for Long Term Goals  Recommendations for other services: None   Discharge Criteria: Patient will be discharged from SLP if patient refuses treatment 3 consecutive times without medical reason, if treatment goals not met, if there is a change in medical status, if patient makes no progress towards goals or if patient is discharged from hospital.  The above assessment, treatment plan, treatment alternatives and goals were discussed and mutually agreed upon: by patient  Patty Sermons 05/29/2021, 5:04 PM

## 2021-05-29 NOTE — Progress Notes (Signed)
Inpatient Rehabilitation Care Coordinator Assessment and Plan Patient Details  Name: Sherry [name] MRN: [id number] Date of Birth: [birth year]  Today's Date: 05/29/2021  Hospital Problems: Principal Problem:   Acute ischemic left middle cerebral artery (MCA) stroke [facility])  Past Medical History:  Past Medical History:  Diagnosis Date   Acid reflux    Arthritis    Atrial fibrillation (HCC)    Chronic constipation    GERD (gastroesophageal reflux disease)    Hyperlipidemia    Hypertension    Osteoarthritis    Pressure ulcer of sacral region    Past Surgical History:  Past Surgical History:  Procedure Laterality Date   NO PAST SURGERIES     Social History:  reports that she has quit smoking. She has never used smokeless tobacco. She reports that she does not drink alcohol and does not use drugs.  Family / Support Systems Marital Status: Widow/Widower Patient Roles: Parent Children: Ina-daughter [phone]  [name]-[phone]  Erich Montane [id number] Other Supports: Shirlene-daughter in-law [id number] Anticipated Caregiver: Claiborne Rigg Ability/Limitations of Caregiver: Can assist and was prior to admission. Was staying the night with her on acute and told could not on rehab Caregiver Availability: 24/7 Family Dynamics: Close knit family who will take care of Momma and have been. Pt feels they will provide whatever she will need when she goes home.  Social History Preferred language: English Religion: Holiness Cultural Background: No issues Education: none Health Literacy - How often do you need to have someone help you when you read instructions, pamphlets, or other written material from your doctor or pharmacy?: Always Writes: No Employment Status: Retired Public relations account executive Issues: No issues Guardian/Conservator: According to MD pt is not capable of making her decisions while here. Her daughter [name] is her Chauncey Reading will need to contact her if this is needed [phone]    Abuse/Neglect Abuse/Neglect Assessment Can Be Completed: Yes Physical Abuse: Denies Verbal Abuse: Denies Sexual Abuse: Denies Exploitation of patient/patient's resources: Denies Self-Neglect: Denies  Patient response to: Social Isolation - How often do you feel lonely or isolated from those around you?: Never  Emotional Status Pt's affect, behavior and adjustment status: Pt is able to answer questions takes a little time but answers correctly. She was using a rolling walker prior to admission and did have help with bathing and dressing. She does want her daughter to be able to stay with her. Recent Psychosocial Issues: other health issues Psychiatric History: No history answers questions and it participating in therapies. Will await therapy team assessment and see if would benefit from neuro-psych while here Substance Abuse History: No issues  Patient / Family Perceptions, Expectations & Goals Pt/Family understanding of illness & functional limitations: Pt knows she is in the hospital but daughter and family have a better understanding of being here and talk with the MD. They are aware she has had a stroke and the deficits she has. Premorbid pt/family roles/activities: Mom, great great great grandmother, church member etc Anticipated changes in roles/activities/participation: resume Pt/family expectations/goals: Pt states: " I want to see my family they are coming."  Daughter states: " We want to have my sister say the night with her, she sleeps better when she does. "  [employer]: Other (Comment) Premorbid Home Care/DME Agencies: Other (Comment) (has aide 2x week for bathing and dressing. Family walked with her prior to admission) Transportation available at discharge: Family Is the patient able to respond to transportation needs?: Yes In the past 12 months, has lack of transportation  kept you from medical appointments or from getting medications?: No In  the past 12 months, has lack of transportation kept you from meetings, work, or from getting things needed for daily living?: No  Discharge Planning Living Arrangements: Children Support Systems: Children, Other relatives, Friends/neighbors, Church/faith community Type of Residence: Private residence Insurance Resources: Medicare, [employer] (specify county) Surveyor, quantity Resources: Family Support, Restaurant manager, fast food Screen Referred: No Living Expenses: Lives with family Money Management: Family Does the patient have any problems obtaining your medications?: No Home Management: Family Patient/Family Preliminary Plans: Return to daughter's home where she was living. She has assist with bathing and dressing prior to admission. She used a rolling walker prior to admission. She was having balance issues prior to admission and they had to walk behind for safety. Will await therapy team evaluations. Care Coordinator Anticipated Follow Up Needs: HH/OP  Clinical Impression Pleasant elderly female who is motivated and willing to participate in therapies this am. Family members upset daughter was allowed to stay the night like she had on acute unit. Will reach out to management and MD regarding allowing daughter to stay over night with her. Will await therapy evaluations and work on discharge needs.  Lucy Chris 05/29/2021, 10:37 AM

## 2021-05-29 NOTE — Progress Notes (Signed)
   05/29/21 1045  Clinical Encounter Type  Visited With Patient not available    CH attempted visit per [facility] consult for support; pt. not in room at this time.  Chaplains will follow up at a later time.

## 2021-05-29 NOTE — Progress Notes (Signed)
Patient resting in bed. Daughter at bedside, ok for daughter to stay  overnight. Patient is A&O to self and birthday. Complained of lower back pain throughout the shift. PRN Tylenol given as ordered and effective. Sorbitol given for constipation. Patient has not had a BM at this time. Will inform [place] nurse to follow bowel protocol. Personal items within reach. Safety maintained.

## 2021-05-29 NOTE — Progress Notes (Signed)
Inpatient Rehabilitation Center Individual Statement of Services  Patient Name:  Sherry [name]  Date:  05/29/2021  Welcome to the Inpatient Rehabilitation Center.  Our goal is to provide you with an individualized program based on your diagnosis and situation, designed to meet your specific needs.  With this comprehensive rehabilitation program, you will be expected to participate in at least 3 hours of rehabilitation therapies Monday-Friday, with modified therapy programming on the weekends.  Your rehabilitation program will include the following services:  Physical Therapy (PT), Occupational Therapy (OT), Speech Therapy (ST), 24 hour per day rehabilitation nursing, Therapeutic Recreaction (TR), Care Coordinator, Rehabilitation Medicine, Nutrition Services, and Pharmacy Services  Weekly team conferences will be held on Wednesday to discuss your progress.  Your Inpatient Rehabilitation Care Coordinator will talk with you frequently to get your input and to update you on team discussions.  Team conferences with you and your family in attendance may also be held.  Expected length of stay: 2 weeks  Overall anticipated outcome: mod assist level  Depending on your progress and recovery, your program may change. Your Inpatient Rehabilitation Care Coordinator will coordinate services and will keep you informed of any changes. Your Inpatient Rehabilitation Care Coordinator's name and contact numbers are listed  below.  The following services may also be recommended but are not provided by the Inpatient Rehabilitation Center:   Home Health Rehabiltiation Services Outpatient Rehabilitation Services    Arrangements will be made to provide these services after discharge if needed.  Arrangements include referral to agencies that provide these services.  Your insurance has been verified to be:   medicare & medicaid Your primary doctor is:  Orpah Cobb  Pertinent information will be shared with your  doctor and your insurance company.  Inpatient Rehabilitation Care Coordinator:  Dossie Der, [place] [phone] or Luna Glasgow  Information discussed with and copy given to patient by: Lucy Chris, 05/29/2021, 10:39 AM

## 2021-05-29 NOTE — Evaluation (Signed)
Occupational Therapy Assessment and Plan  Patient Details  Name: Sherry [name] MRN: [id number] Date of Birth: 11-Aug-1919  OT Diagnosis: abnormal posture, altered mental status, cognitive deficits, disturbance of vision, hemiplegia affecting dominant side, and muscle weakness (generalized) Rehab Potential: Rehab Potential (ACUTE ONLY): Fair ELOS: 18-21 days   Today's Date: 05/29/2021 OT Individual Time: 7673-4193 OT Individual Time Calculation (min): 69 min     Hospital Problem: Principal Problem:   Acute ischemic left middle cerebral artery (MCA) stroke (HCC)   Past Medical History:  Past Medical History:  Diagnosis Date   Acid reflux    Arthritis    Atrial fibrillation (HCC)    Chronic constipation    GERD (gastroesophageal reflux disease)    Hyperlipidemia    Hypertension    Osteoarthritis    Pressure ulcer of sacral region    Past Surgical History:  Past Surgical History:  Procedure Laterality Date   NO PAST SURGERIES      Assessment & Plan Clinical Impression: Patient is a 87 y.o. year old female with recent admission to the hospital on11/05/22 with onset of right sided weakness with inability to speak and right visual field deficits. CTA head showed no LVO but L-ICA stenosis with radiographic string sign, 65-70% R-ICA stenosis, heavy calcified bilateral ICA siphons, fusiform aneurysmal dilatation of aortic arch with bulky soft mural plaque or thrombus. She received TNK and follow up CT without bleed. Carotid dopplers were negative for ICA stenosis. 2 D echo showed eF 60-65% with no wall abnormality, bilatrial dilatation, severe aortic stenosis, severe mitral calcification, moderate to severe TVR. She did develop A fib/Aflutter on 11/07 and PCCM recommended resuming Cardizem as well as questioned AC with high risk for falls.  Patient transferred to CIR on 05/28/2021 .    Patient currently requires total with basic self-care skills secondary to muscle weakness and muscle  joint tightness, impaired timing and sequencing, unbalanced muscle activation, decreased coordination, and decreased motor planning, field cut, decreased attention to right and decreased motor planning, decreased initiation, decreased awareness, decreased problem solving, and decreased memory, and decreased sitting balance, decreased standing balance, decreased postural control, hemiplegia, and decreased balance strategies.  Prior to hospitalization, patient could complete ADLs with max.  Patient will benefit from skilled intervention to decrease level of assist with basic self-care skills and increase independence with basic self-care skills prior to discharge home with care partner.  Anticipate patient will require moderate physical assestance and follow up home health.  OT - End of Session Activity Tolerance: Decreased this session;Tolerates 10 - 20 min activity with multiple rests Endurance Deficit: Yes OT Assessment Rehab Potential (ACUTE ONLY): Fair OT Patient demonstrates impairments in the following area(s): Balance;Cognition;Endurance;Motor;Perception;Safety OT Basic ADL's Functional Problem(s): Eating;Grooming;Bathing;Dressing;Toileting OT Transfers Functional Problem(s): Toilet;Tub/Shower OT Additional Impairment(s): Fuctional Use of Upper Extremity OT Plan OT Intensity: Minimum of 1-2 x/day, 45 to 90 minutes OT Frequency: 5 out of 7 days OT Duration/Estimated Length of Stay: 18-21 days OT Treatment/Interventions: Balance/vestibular training;Discharge planning;Pain management;Self Care/advanced ADL retraining;Therapeutic Activities;UE/LE Coordination activities;Cognitive remediation/compensation;Disease mangement/prevention;Functional mobility training;Patient/family education;Therapeutic Exercise;Visual/perceptual remediation/compensation;UE/LE Strength taining/ROM;Neuromuscular re-education;DME/adaptive equipment instruction OT Self Feeding Anticipated Outcome(s): min assist OT Basic  Self-Care Anticipated Outcome(s): max assist OT Toileting Anticipated Outcome(s): mod assist OT Bathroom Transfers Anticipated Outcome(s): mod assist OT Recommendation Patient destination: Home Follow Up Recommendations: 24 hour supervision/assistance;Home health OT Equipment Recommended: To be determined   OT Evaluation Precautions/Restrictions  Precautions Precautions: Fall Precaution Comments: HOH, sacral PI, R hemi General   Vital Signs Therapy Vitals  Temp: 98 F (36.7 C) Temp Source: Oral Pulse Rate: 77 Resp: 16 BP: 119/72 Patient Position (if appropriate): Lying Oxygen Therapy O2 Device: Room Air Pain Pain Assessment Pain Score: 1  Home Living/Prior Functioning Home Living Family/patient expects to be discharged to:: Private residence Living Arrangements: Children Available Help at Discharge: Family, Available 24 hours/day Type of Home: House Home Access: Ramped entrance Home Layout: One level Bathroom Shower/Tub: Walk-in shower (pt reports taking sponge baths most of the time) Biochemist, clinical: Standard Bathroom Accessibility: Yes Additional Comments: Aide comes mon, wednesday, friday for three hours  Lives With: Son, Daughter IADL History Homemaking Responsibilities: No Current License: No Occupation: Retired Prior Function Level of Independence: Needs assistance with ADLs, Needs assistance with tranfers, Needs assistance with homemaking, Needs assistance with gait  Able to Take Stairs?: No Driving: No Vocation: Retired Surveyor, mining Baseline Vision/History: 0 No visual deficits Ability to See in Adequate Light: 0 Adequate Patient Visual Report: No change from baseline Vision Assessment?: Yes;Vision impaired- to be further tested in functional context Perception  Perception: Impaired Inattention/Neglect: Impaired-to be further tested in functional context Praxis Praxis: Impaired Praxis Impairment Details: Motor planning;Initiation Cognition Overall  Cognitive Status: Impaired/Different from baseline Arousal/Alertness:  (drowsy, in and out of sleep) Orientation Level: Person;Place;Situation Person: Oriented Place: Oriented Situation: Oriented Year: Other (Comment) (2002) Month: August Day of Week: Incorrect Memory: Impaired Memory Impairment: Storage deficit;Retrieval deficit Immediate Memory Recall:  (unable to recall any of the words) Memory Recall Sock: Not able to recall Memory Recall Blue: Not able to recall Memory Recall Bed: Not able to recall Attention: Focused;Sustained Focused Attention: Appears intact Sustained Attention: Impaired Sustained Attention Impairment: Functional basic;Verbal basic Awareness: Impaired Awareness Impairment: Emergent impairment Problem Solving: Impaired Problem Solving Impairment: Functional basic;Verbal basic Safety/Judgment: Impaired Comments: cognitive assessment limited secondary to aphasia Sensation Sensation Light Touch: Appears Intact Hot/Cold: Not tested Proprioception: Not tested Stereognosis: Not tested Additional Comments: Pt able to detect light touch and states equal sensation in the RUE and LUE with gross testing. Coordination Gross Motor Movements are Fluid and Coordinated: No Fine Motor Movements are Fluid and Coordinated: No Coordination and Movement Description: Pt with Brunnstrum stage III-IV movement in the RUE with functional use during selfcare tasks.   needs mod assist to incorporate into bathing tasks secondary to weakness and motor planning deficits. Motor  Motor Motor: Hemiplegia;Abnormal postural alignment and control Motor - Skilled Clinical Observations: generalized weakness, R hemi  Trunk/Postural Assessment  Cervical Assessment Cervical Assessment: Exceptions to [facility] (forward head) Thoracic Assessment Thoracic Assessment: Exceptions to [facility] (thoracic kyphosis with posterior lean) Lumbar Assessment Lumbar Assessment: Exceptions to [facility] (posterior pelvic  tilt) Postural Control Postural Limitations: strong posterior bias  Balance Balance Balance Assessed: Yes Static Sitting Balance Static Sitting - Balance Support: Feet supported Static Sitting - Level of Assistance: 3: Mod assist Dynamic Sitting Balance Dynamic Sitting - Balance Support: During functional activity Dynamic Sitting - Level of Assistance: 2: Max assist Static Standing Balance Static Standing - Balance Support: During functional activity;Bilateral upper extremity supported Static Standing - Level of Assistance: 1: +1 Total assist Dynamic Standing Balance Dynamic Standing - Balance Support: During functional activity;Bilateral upper extremity supported Dynamic Standing - Level of Assistance: 1: +2 Total assist Extremity/Trunk Assessment RUE Assessment RUE Assessment: Exceptions to [facility] Passive Range of Motion (PROM) Comments: shoulder flexion 0-130 degrees approximately, limitations in full elbow extension as well as digit flexion secondary to arthritic changes in the fingers Active Range of Motion (AROM) Comments: Brunnstrum stage III-IV in the  arm and hand.  Decreased FM coordination noted for grasping and holding items such as the deodorant or soap bottles.  Min assist to place the deodorant in her hand where she was able to maintain grasp while opening it with the left. LUE Assessment LUE Assessment: Exceptions to [facility] Active Range of Motion (AROM) Comments: shoulder flexion 0-100 degrees, elbow flexion AROM WFLs, digit flexion limited secondary to arthritic changes in the digits. General Strength Comments: 3+/5 throughout  Care Tool Care Tool Self Care Eating   Eating Assist Level: Maximal Assistance - Patient 25 - 49%    Oral Care    Oral Care Assist Level: Maximal assistance - Patient 25 - 49%    Bathing   Body parts bathed by patient: Right arm;Chest;Abdomen;Right upper leg;Left upper leg;Face Body parts bathed by helper: Buttocks;Front perineal area;Left lower  leg;Right lower leg   Assist Level: 2 Helpers (sit to stand)    Upper Body Dressing(including orthotics)   What is the patient wearing?: Pull over shirt   Assist Level: Total Assistance - Patient < 25%    Lower Body Dressing (excluding footwear)   What is the patient wearing?: Pants Assist for lower body dressing: Total Assistance - Patient < 25% (supine in bed rolling)    Putting on/Taking off footwear   What is the patient wearing?: Non-skid slipper socks;Ted hose Assist for footwear: Dependent - Patient 0%       Care Tool Toileting Toileting activity   Assist for toileting: 2 Helpers     Care Tool Bed Mobility Roll left and right activity   Roll left and right assist level: Maximal Assistance - Patient 25 - 49%    Sit to lying activity   Sit to lying assist level: Total Assistance - Patient < 25%    Lying to sitting on side of bed activity   Lying to sitting on side of bed assist level: the ability to move from lying on the back to sitting on the side of the bed with no back support.: Total Assistance - Patient < 25%     Care Tool Transfers Sit to stand transfer   Sit to stand assist level: Total Assistance - Patient < 25%    Chair/bed transfer   Chair/bed transfer assist level: 2 Pension scheme manager transfer   Assist Level: 2 Helpers     Care Tool Cognition  Expression of Ideas and Wants Expression of Ideas and Wants: 3. Some difficulty - exhibits some difficulty with expressing needs and ideas (e.g, some words or finishing thoughts) or speech is not clear  Understanding Verbal and Non-Verbal Content Understanding Verbal and Non-Verbal Content: 3. Usually understands - understands most conversations, but misses some part/intent of message. Requires cues at times to understand   Memory/Recall Ability Memory/Recall Ability : That he or she is in a hospital/hospital unit   Refer to Care Plan for Long Term Goals  SHORT TERM GOAL WEEK 1 OT Short Term Goal 1 (Week  1): Pt will self feed for 25% of meal with mod assist. OT Short Term Goal 2 (Week 1): Pt will complete UB dressing with mod assist to donn a pullover shirt. OT Short Term Goal 3 (Week 1): Pt will complete sit to stand for LB selfcare tasks with max assist. OT Short Term Goal 4 (Week 1): Pt will use the RUE at a gross assist level with min facilitation during selfcare tasks.  Recommendations for other services: None    Skilled Therapeutic  Intervention ADL ADL Eating: Dependent (Family feeds her at home per report) Where Assessed-Eating: Bed level Grooming: Maximal assistance Where Assessed-Grooming: Edge of bed Upper Body Bathing: Maximal assistance Where Assessed-Upper Body Bathing: Edge of bed Lower Body Bathing: Dependent Where Assessed-Lower Body Bathing: Edge of bed Upper Body Dressing: Maximal assistance Where Assessed-Upper Body Dressing: Edge of bed Lower Body Dressing: Dependent Where Assessed-Lower Body Dressing: Edge of bed Toileting: Dependent Where Assessed-Toileting: Bedside Commode Toilet Transfer: Dependent Toilet Transfer Method: Stand pivot Science writer: Radiographer, therapeutic: Not assessed Social research officer, government: Not assessed Mobility  Bed Mobility Bed Mobility: Rolling Right;Supine to Sit;Sit to Supine;Rolling Left Rolling Right: Moderate Assistance - Patient 50-74% Rolling Left: Total Assistance - Patient < 25% Supine to Sit: Total Assistance - Patient < 25% Sit to Supine: Total Assistance - Patient < 25% Transfers Sit to Stand: Total Assistance - Patient < 25% Stand to Sit: Total Assistance - Patient < 25%  Session Note:  Pt in bed to start, agreeable to participation in OT eval.  She needed total assist for supine to sit EOB for work on self care tasks.  Moderate posterior pelvic tilt noted with increased posterior lean and LOB once sitting.  Mod assist overall for static sitting balance with max demonstrational cueing to  correct sitting posture.  She was able to participate some in bathing, but feel this is probably completed at total assist at home from family and aide.  She needed max assist for thoroughness with UB bathing with therapist squeezing out the washcloth and handing it too her to help decreased spillage and possibility of overturning the water pan.  She exhibits decreased ability to efficiently manipulate the washcloth with the right hand and wash the LUE or other parts of her body.  She did however frequently try to integrate use of the LUE without cueing from therapist.  Total assist for sit to stand and standing with and without use of the RW.  Transitioned to supine secondary to not having +2 for LB tasks as well as pt reporting being tired.  Total assist for rolling to the left for pulling pants over hips after therapist donned them over her LEs.  Mod assist for rolling to the right.  Finished session with pt in the bed resting and with the call button and phone in reach.  Nursing in room as well.  No family present to discuss PLOF and goal expectations and pt unable to participate at this time.    Discharge Criteria: Patient will be discharged from OT if patient refuses treatment 3 consecutive times without medical reason, if treatment goals not met, if there is a change in medical status, if patient makes no progress towards goals or if patient is discharged from hospital.  The above assessment, treatment plan, treatment alternatives and goals were discussed and mutually agreed upon: No family available/patient unable  Brittnye Josephs OTR/L 05/29/2021, 5:03 PM

## 2021-05-29 NOTE — Evaluation (Signed)
Physical Therapy Assessment and Plan  Patient Details  Name: Sherry [name] MRN: [id number] Date of Birth: 09-18-1919  PT Diagnosis: Abnormal posture, Abnormality of gait, Cognitive deficits, Difficulty walking, Hemiparesis dominant, Impaired cognition, Muscle weakness, and Pain in sacrum  Rehab Potential: Poor ELOS: ~2 weeks   Today's Date: 05/29/2021 PT Individual Time: 1000-1058 PT Individual Time Calculation (min): 58 min    Hospital Problem: Principal Problem:   Acute ischemic left middle cerebral artery (MCA) stroke (HCC)   Past Medical History:  Past Medical History:  Diagnosis Date   Acid reflux    Arthritis    Atrial fibrillation (HCC)    Chronic constipation    GERD (gastroesophageal reflux disease)    Hyperlipidemia    Hypertension    Osteoarthritis    Pressure ulcer of sacral region    Past Surgical History:  Past Surgical History:  Procedure Laterality Date   NO PAST SURGERIES      Assessment & Plan Clinical Impression: Patient is a 87 y.o. year old female with history of HTN, AFib, advanced OA right knee and bilateral ankle tendinitis who was admitted on 05/24/21 with onset of right sided weakness with inability to speak and right visual field deficits. CTA head showed no LVO but L-ICA stenosis with radiographic string sign, 65-70% R-ICA stenosis, heavy calcified bilateral ICA siphons, fusiform aneurysmal dilatation of aortic arch with bulky soft mural plaque or thrombus. She received TNK and follow up CT without bleed. Carotid dopplers were negative for ICA stenosis. 2 D echo showed eF 60-65% with no wall abnormality, bilatrial dilatation, severe aortic stenosis, severe mitral calcification, moderate to severe TVR. She did develop A fib/Aflutter on 11/07 and PCCM recommended resuming Cardizem as well as questioned AC with high risk for falls.    Dr. Leonie Man felt that stroke was embolic from A fib/A flutter and recommended ASA/Plavix X 3 months.  Mentation is  improving with increase in verbal output and on D1, thins (ate blenderized food and ensure PTA). PT/OT has been cotreating patient and family requested CIR for progressive therapy. She continues to be limited by weakness with posterior bias on standing attempts, has delay in processing with anxiety and requires step by step verbal/tactile cues for sequencing with ADL tasks. CIR recommended due to functional decline. She currently has no new complaints.   Patient currently requires max with mobility secondary to muscle weakness, decreased cardiorespiratoy endurance, impaired timing and sequencing, abnormal tone, decreased coordination, and decreased motor planning, decreased visual perceptual skills, decreased attention to right and decreased motor planning, decreased initiation, decreased attention, decreased awareness, decreased problem solving, decreased safety awareness, decreased memory, and delayed processing, and decreased sitting balance, decreased standing balance, decreased postural control, hemiplegia, and decreased balance strategies.  Prior to hospitalization, patient was min with mobility and lived with Son, Daughter in a House home.  Home access is  Ramped entrance.  Patient will benefit from skilled PT intervention to maximize safe functional mobility, minimize fall risk, and decrease caregiver burden for planned discharge home with 24 hour assist.  Anticipate patient will  benefit from [facility] vs SNF  at discharge.  PT - End of Session Activity Tolerance: Tolerates < 10 min activity, no significant change in vital signs Endurance Deficit: Yes PT Assessment Rehab Potential (ACUTE/IP ONLY): Poor PT Barriers to Discharge: Inaccessible home environment;Decreased caregiver support;Lack of/limited family support;Behavior;Other (comments) PT Barriers to Discharge Comments: age PT Patient demonstrates impairments in the following area(s):  Balance;Behavior;Endurance;Motor;Nutrition;Pain;Perception;Safety;Sensory;Skin Integrity PT Transfers Functional Problem(s): Bed Mobility;Bed  to Chair;Car;Furniture PT Locomotion Functional Problem(s): Ambulation;Wheelchair Mobility;Stairs PT Plan PT Frequency: Total of 15 hours over 7 days of combined therapies PT Duration Estimated Length of Stay: ~2 weeks PT Treatment/Interventions: Ambulation/gait training;Discharge planning;Functional mobility training;Psychosocial support;Therapeutic Activities;Visual/perceptual remediation/compensation;Balance/vestibular training;Disease management/prevention;Neuromuscular re-education;Skin care/wound management;Therapeutic Exercise;Wheelchair propulsion/positioning;Cognitive remediation/compensation;DME/adaptive equipment instruction;Pain management;Splinting/orthotics;UE/LE Strength taining/ROM;Community reintegration;Functional electrical stimulation;Patient/family education;Stair training;UE/LE Coordination activities PT Transfers Anticipated Outcome(s): ModA PT Locomotion Anticipated Outcome(s): very limited household ModA PT Recommendation Follow Up Recommendations: Home health PT;24 hour supervision/assistance;Skilled nursing facility Patient destination: Home (vs SNF) Equipment Recommended: To be determined   PT Evaluation Precautions/Restrictions Precautions Precautions: Fall Precaution Comments: HOH, sacral PI, R hemi Restrictions Weight Bearing Restrictions: No  Pain Pain Assessment Pain Scale: 0-10 Pain Score: 7  Pain Type: Acute pain Pain Location:  ("between my legs") Pain Interference Pain Interference Pain Effect on Sleep: 2. Occasionally Pain Interference with Therapy Activities: 2. Occasionally Pain Interference with Day-to-Day Activities: 3. Frequently Home Living/Prior Functioning Home Living Living Arrangements: Children Available Help at Discharge: Family;Available 24 hours/day (PCA 3 days a week for 2  hours/day) Type of Home: House Home Access: Ramped entrance Home Layout: One level Bathroom Shower/Tub: Multimedia programmer: Standard Additional Comments: Aide comes mon, wednesday, friday for three hours  Lives With: Son;Daughter Prior Function Level of Independence: Needs assistance with ADLs;Needs assistance with tranfers;Needs assistance with homemaking;Needs assistance with gait (taken from chart review, no family present to confirm)  Able to Take Stairs?: No Driving: No Vision/Perception  Vision - Assessment Additional Comments: limited attention to the R Perception Perception: Impaired Inattention/Neglect: Does not attend to right visual field;Does not attend to right side of body Praxis Praxis: Impaired Praxis Impairment Details: Motor planning;Perseveration;Initiation  Cognition Overall Cognitive Status: No family/caregiver present to determine baseline cognitive functioning Arousal/Alertness: Awake/alert Memory: Impaired Awareness: Impaired Problem Solving: Impaired Safety/Judgment: Impaired Sensation Sensation Light Touch: Impaired by gross assessment (Patient unable to follow cues to confirm) Hot/Cold: Not tested Proprioception: Impaired by gross assessment Stereognosis: Impaired by gross assessment Coordination Gross Motor Movements are Fluid and Coordinated: No Fine Motor Movements are Fluid and Coordinated: No Heel Shin Test: limited AROM B Motor  Motor Motor: Hemiplegia;Abnormal tone;Abnormal postural alignment and control Motor - Skilled Clinical Observations: generalized weakness, R hemi   Trunk/Postural Assessment  Cervical Assessment Cervical Assessment: Exceptions to [facility] Thoracic Assessment Thoracic Assessment: Exceptions to [facility] Lumbar Assessment Lumbar Assessment: Exceptions to [facility] Postural Control Postural Control: Deficits on evaluation Righting Reactions: delayed and inadequate Protective Responses: delayed and  inadequate Postural Limitations: strong posterior bias  Balance Balance Balance Assessed: Yes Standardized Balance Assessment Standardized Balance Assessment: PASS (7) Static Sitting Balance Static Sitting - Balance Support: Feet supported Static Sitting - Level of Assistance: 1: +1 Total assist Dynamic Sitting Balance Dynamic Sitting - Balance Support: During functional activity;Feet supported Dynamic Sitting - Level of Assistance: 1: +2 Total assist Static Standing Balance Static Standing - Balance Support: During functional activity;Bilateral upper extremity supported Static Standing - Level of Assistance: 1: +1 Total assist Dynamic Standing Balance Dynamic Standing - Balance Support: During functional activity;Bilateral upper extremity supported Dynamic Standing - Level of Assistance: 1: +2 Total assist Postural Assessment Scale for Stroke Patients (PASS)  Give the subject instructions for each item as written below. When scoring the item, record the lowest response category that applies for each item.  Maintaining a Posture  __ 1. Sitting Without Support Instructions: Have the subject sit on a bench/mat without back support and with feet flat on the floor. (3) Can sit  for 5 minutes without support (2) Can sit for more than 10 seconds without support (1) Can sit with slight support (for example, by 1 hand) (0) Cannot sit  __ 2. Standing With Support Instructions: Have the subject stand, providing support as needed. Evaluate only the ability to stand with or without support. Do not consider the quality of the stance. (3) Can stand with support of only 1 hand (2) Can stand with moderate support of 1 person (1) Can stand with strong support of 2 people (0) Cannot stand, even with support  __ 3. Standing Without Support Instructions: Have the subject stand without support. Evaluate only the ability to stand with or without support. Do not consider the quality of the  stance. (3) Can stand without support for more than 1 minute and simultaneously perform arm movements at about shoulder level (2) Can stand without support for 1 minute or stands slightly asymmetrically  (1) Can stand without support for 10 seconds or leans heavily on 1 leg (0) Cannot stand without support  __ 4. Standing on Nonparetic Leg Instructions: Have the subject stand on the nonparetic leg. Evaluate only the ability to bear weight entirely on the nonparetic leg. Do not consider how the subject accomplishes the task. (3) Can stand on nonparetic leg for more than 10 seconds (2) Can stand on nonparetic leg for more than 5 seconds (1) Can stand on nonparetic leg for a few seconds (0) Cannot stand on nonparetic leg  __ 5. Standing on Paretic Leg Instructions: Have the subject stand on the paretic leg. Evaluate only the ability to bear weight entirely on the paretic leg. Do not consider how the subject accomplishes the task. (3) Can stand on paretic leg for more than 10 seconds (2) Can stand on paretic leg for more than 5 seconds (1) Can stand on paretic leg for a few seconds (0) Cannot stand on paretic leg  Maintaining Posture SUBTOTAL ____1____  Changing a Posture  __ 6. Supine to Paretic Side Lateral Instructions: Begin with the subject in supine on a treatment mat. Instruct the subject to roll to the paretic side (lateral movement). Assist as necessary. Evaluate the subject's performance on the amount of help required. Do not consider the quality of performance. (3) Can perform without help (2) Can perform with little help (1) Can perform with much help (0) Cannot perform  __ 7. Supine to Nonparetic Side Lateral Instructions: Begin with the subject in supine on a treatment mat. Instruct the subject to roll to the nonparetic side (lateral movement). Assist as necessary. Evaluate the subject's performance on the amount of help required. Do not consider the quality of  performance. (3) Can perform without help (2) Can perform with little help (1) Can perform with much help (0) Cannot perform  __ 8. Supine to Sitting Up on the Edge of the Mat Instructions: Begin with the subject in supine on a treatment mat. Instruct the subject to come to sitting on the edge of the mat. Assist as necessary. Evaluate the subject's performance on the amount of help required. Do not consider the quality of performance. (3) Can perform without help (2) Can perform with little help (1) Can perform with much help (0) Cannot perform  __ 9. Sitting on the Edge of the Mat to Supine Instructions: Begin with the on the edge of a treatment mat. Instruct the subject to return to supine. Assist as necessary. Evaluate the subject's performance on the amount of help required. Do not  consider the quality of performance. (3) Can perform without help (2) Can perform with little help (1) Can perform with much help (0) Cannot perform  __ 10. Sitting to Standing Up Instructions: Begin with the subject sitting on the edge of a treatment mat. Instruct the subject to stand up without support. Assist if necessary. Evaluate the subject's performance on the amount of help required. Do not consider the quality of performance. (3) Can perform without help (2) Can perform with little help (1) Can perform with much help (0) Cannot perform  __ 11. Standing Up to Sitting Down Instructions: Begin with the subject standing by edge of a treatment mat. Instruct the subject to sit on edge of mat without support. Assist if necessary. Evaluate the subject's performance on the amount of help required. Do not consider the quality of performance. (3) Can perform without help (2) Can perform with little help (1) Can perform with much help (0) Cannot perform  __ 12. Standing, Picking Up a Pencil from the Floor Instructions: Begin with the subject standing. Instruct the subject to pick up a pencil fro the  floor without support. Assist if necessary. Evaluate the subject's performance on the amount of help required. Do not consider the quality of performance. (3) Can perform without help (2) Can perform with little help (1) Can perform with much help (0) Cannot perform  Changing Posture SUBTOTAL _6____   TOTAL ___7__  Extremity Assessment      RLE Assessment RLE Assessment: Exceptions to [facility] Strength Comments: unable to follow cues for MMT, able to move in limited ROM LLE Assessment LLE Assessment: Exceptions to [facility] Strength Comments: unable to follow cues for MMT, able to move in limited ROM  Care Tool Care Tool Bed Mobility Roll left and right activity   Roll left and right assist level: Maximal Assistance - Patient 25 - 49%    Sit to lying activity   Sit to lying assist level: 2 Helpers    Lying to sitting on side of bed activity   Lying to sitting on side of bed assist level: the ability to move from lying on the back to sitting on the side of the bed with no back support.: 2 Helpers     Care Tool Transfers Sit to stand transfer   Sit to stand assist level: 2 Helpers    Chair/bed transfer   Chair/bed transfer assist level: Dependent - Patient 0% (stedy)     Toilet transfer Toilet transfer activity did not occur: Safety/medical concerns      Scientist, product/process development transfer activity did not occur: Safety/medical concerns        Care Tool Locomotion Ambulation   Assist level: 2 helpers Assistive device: Walker-rolling Max distance: 1  Walk 10 feet activity Walk 10 feet activity did not occur: Safety/medical concerns       Walk 50 feet with 2 turns activity Walk 50 feet with 2 turns activity did not occur: Safety/medical concerns      Walk 150 feet activity Walk 150 feet activity did not occur: Safety/medical concerns      Walk 10 feet on uneven surfaces activity Walk 10 feet on uneven surfaces activity did not occur: Safety/medical concerns       Stairs Stair activity did not occur: Safety/medical concerns        Walk up/down 1 step activity Walk up/down 1 step or curb (drop down) activity did not occur: Safety/medical concerns      Walk up/down  4 steps activity Walk up/down 4 steps activity did not occur: Safety/medical concerns      Walk up/down 12 steps activity Walk up/down 12 steps activity did not occur: Safety/medical concerns      Pick up small objects from floor Pick up small object from the floor (from standing position) activity did not occur: Safety/medical concerns      Wheelchair Is the patient using a wheelchair?: Yes Type of Wheelchair: Manual   Wheelchair assist level: Total Assistance - Patient < 25% Max wheelchair distance: 150  Wheel 50 feet with 2 turns activity   Assist Level: Total Assistance - Patient < 25%  Wheel 150 feet activity   Assist Level: Total Assistance - Patient < 25%    Refer to Care Plan for Long Term Goals  SHORT TERM GOAL WEEK 1 PT Short Term Goal 1 (Week 1): Patient will complete sit <> supine with MaxA x1 PT Short Term Goal 2 (Week 1): Patient will transfer bed <> wc with LRAD and MaxA x1 PT Short Term Goal 3 (Week 1): Patient will ambulate 7f with LRAD, MaxA x1 and wc follow as needed  Recommendations for other services: None   Skilled Therapeutic Intervention Mobility Bed Mobility Bed Mobility: Rolling Right;Supine to Sit;Rolling Left;Sit to Supine Transfers Transfers: Sit to Stand;Stand to Sit;Transfer;Transfer via LStage manager SAnimal nutritionist Yes Gait Assistance: 2 HHoliday representative(Feet): 1 Feet Assistive device: RBuilding surveyorAssistance Details: Manual facilitation for placement;Manual facilitation for weight shifting;Manual facilitation for weight bearing;Verbal cues for safe use of DME/AE;Verbal cues for precautions/safety;Verbal cues for gait pattern;Tactile cues for placement;Tactile cues for  weight beaing;Tactile cues for sequencing;Tactile cues for initiation;Tactile cues for weight shifting Gait Gait: Yes Gait Pattern: Impaired Gait Pattern: Step-to pattern;Decreased step length - left;Decreased step length - right;Shuffle;Trunk flexed;Narrow base of support Gait velocity: significantly decreased Stairs / Additional Locomotion Stairs: No Wheelchair Mobility Wheelchair Mobility: Yes Wheelchair Assistance: Total Assistance - Patient <25% Wheelchair Propulsion: Both lower extermities Wheelchair Parts Management: Needs assistance Distance: 150   Discharge Criteria: Patient will be discharged from PT if patient refuses treatment 3 consecutive times without medical reason, if treatment goals not met, if there is a change in medical status, if patient makes no progress towards goals or if patient is discharged from hospital.  The above assessment, treatment plan, treatment alternatives and goals were discussed and mutually agreed upon: by patient  JDebbora Dus11/04/2021, 12:02 PM

## 2021-05-30 DIAGNOSIS — I63512 Cerebral infarction due to unspecified occlusion or stenosis of left middle cerebral artery: Secondary | ICD-10-CM | POA: Diagnosis not present

## 2021-05-30 LAB — MAGNESIUM: Magnesium: 2.1 mg/dL (ref 1.7–2.4)

## 2021-05-30 MED ORDER — MAGNESIUM HYDROXIDE 400 MG/5ML PO SUSP
30.0000 mL | Freq: Once | ORAL | Status: AC
  Administered 2021-05-30: 30 mL via ORAL
  Filled 2021-05-30: qty 30

## 2021-05-30 NOTE — Progress Notes (Signed)
Physical Therapy Session Note  Patient Details  Name: Sherry [name] MRN: [id number] Date of Birth: 24-Mar-1920  Today's Date: 05/30/2021 PT Individual Time: 1401-1445 PT Individual Time Calculation (min): 44 min   Short Term Goals: Week 1:  PT Short Term Goal 1 (Week 1): Patient will complete sit <> supine with MaxA x1 PT Short Term Goal 2 (Week 1): Patient will transfer bed <> wc with LRAD and MaxA x1 PT Short Term Goal 3 (Week 1): Patient will ambulate 29ft with LRAD, MaxA x1 and wc follow as needed  Skilled Therapeutic Interventions/Progress Updates: Pt presents sitting in recliner and reluctantly agreeable to therapy, family encouraging.  Pt performed multiple sit to stand transfers into [place] w/ Total A, although pt reaches for bar.  Pt able to transfer sit to stand from [employer] w/ max A and verbal cues.  Pt stood in [place] for changing brief w/ list to right.  Pt incontinent of bowel in brief, NT to chart.  Pt remained sitting in recliner w/ chair alarm on and all needs in reach.       Therapy Documentation Precautions:  Precautions Precautions: Fall Precaution Comments: HOH, sacral PI, R hemi Restrictions Weight Bearing Restrictions: No General:   Vital Signs: Therapy Vitals Temp: 98.1 F (36.7 C) Temp Source: Oral Pulse Rate: 97 Resp: 18 BP: (!) 156/87 Patient Position (if appropriate): Sitting Oxygen Therapy SpO2: 98 % O2 Device: Room Air Pain:0/10 Pain Assessment Faces Pain Scale: Hurts little more Pain Location: Back Pain Orientation: Lower Pain Descriptors / Indicators: Cramping Pain Onset: Gradual Patients Stated Pain Goal: 2 Pain Intervention(s): RN made aware;Medication (See eMAR);Repositioned Multiple Pain Sites: No Mobility:        Therapy/Group: Individual Therapy  Lucio Edward 05/30/2021, 2:44 PM

## 2021-05-30 NOTE — Progress Notes (Addendum)
Occupational Therapy Session Note  Patient Details  Name: Sherry [name] MRN: [id number] Date of Birth: 07/01/1920  Today's Date: 05/30/2021 OT Individual Time: 1145-12;40; 8315-1761 OT Individual Time Calculation (min):55 min; and 45 min    Short Term Goals: Week 1:  OT Short Term Goal 1 (Week 1): Pt will self feed for 25% of meal with mod assist. OT Short Term Goal 2 (Week 1): Pt will complete UB dressing with mod assist to donn a pullover shirt. OT Short Term Goal 3 (Week 1): Pt will complete sit to stand for LB selfcare tasks with max assist. OT Short Term Goal 4 (Week 1): Pt will use the RUE at a gross assist level with min facilitation during selfcare tasks.  Skilled Therapeutic Interventions/Progress Updates:    First session: Pt semi reclined in bed, stating she doesn't feel good and dtr present reporting she hasnt had a bowel movement in 7 days and nurse recently gave pt a suppository.  Therapist noted pt had incontinent episode of bowel and bladder requiring total assist for rolling, pericare, and clothing mgt.  Dtr very involved in assisting pt during session and encouraging her mom to participate with therapy throughout.  Pt completed supine to sit with max assist.  Pt needing mod to max assist to keep balance in static sitting due to heavy right and posterior lean.  Total assist stand pivot EOB to recliner.  Call bell in reach, seat alarm on.    Second session: Pt sitting up in recliner, requesting back to bed and no c/o pain.  Pt instructed in anterior trunk leaning and weight shifting in preperation for sit<>stand.  Pt completed x 8 reps with increased time and max multimodal cues.  Pt donned grip socks with instruction on figure 4 position and forward chaining needing mod assist overall to complete.  Pt provided visual demo of body mechanics for stand pivot prior to completion.  Encouragement needed due to pt expressing fear of falling.  Son in room for encouragement and  reassurance.  Pt completed stand pivot to EOB with total assist.  Max assist sit to supine.  Total assist to reposition in bed.  Call bell in reach, bed alarm on.  Therapy Documentation Precautions:  Precautions Precautions: Fall Precaution Comments: HOH, sacral PI, R hemi Restrictions Weight Bearing Restrictions: No   Therapy/Group: Individual Therapy  Amie Critchley 05/30/2021, 6:04 PM

## 2021-05-30 NOTE — IPOC Note (Signed)
Overall Plan of Care [facility]) Patient Details Name: Sherry [name] MRN: [id number] DOB: 30-Oct-1919  Admitting Diagnosis: Acute ischemic left middle cerebral artery (MCA) stroke [facility])  Hospital Problems: Principal Problem:   Acute ischemic left middle cerebral artery (MCA) stroke ([place])     Functional Problem List: Nursing Medication Management, Safety, Nutrition, Endurance, Pain, Bowel, Bladder  PT Balance, Behavior, Endurance, Motor, Nutrition, Pain, Perception, Safety, Sensory, Skin Integrity  OT Balance, Cognition, Endurance, Motor, Perception, Safety  SLP Cognition, Linguistic, Nutrition  TR         Basic ADL's: OT Eating, Grooming, Bathing, Dressing, Toileting     Advanced  ADL's: OT       Transfers: PT Bed Mobility, Bed to Chair, Car, Manufacturing systems engineer, Metallurgist: PT Ambulation, Emergency planning/management officer, Stairs     Additional Impairments: OT Fuctional Use of Upper Extremity  SLP Swallowing, Communication, Social Cognition expression, comprehension Problem Solving, Memory, Attention, Awareness  TR      Anticipated Outcomes Item Anticipated Outcome  Self Feeding min assist  Swallowing  sup A   Basic self-care  max assist  Toileting  mod assist   Bathroom Transfers mod assist  Bowel/Bladder  manage bowel w mod I and bladder with min assist  Transfers  ModA  Locomotion  very limited household ModA  Communication  mod A  Cognition     Pain  at or below level 4 w prn meds  Safety/Judgment  maintain safety w cues   Therapy Plan: PT Frequency: Total of 15 hours over 7 days of combined therapies PT Duration Estimated Length of Stay: ~2 weeks OT Intensity: Minimum of 1-2 x/day, 45 to 90 minutes OT Frequency: 5 out of 7 days OT Duration/Estimated Length of Stay: 18-21 days SLP Intensity: Minumum of 1-2 x/day, 30 to 90 minutes SLP Frequency: 3 to 5 out of 7 days SLP Duration/Estimated Length of Stay: ~2 weeks   Due to the current state  of emergency, patients may not be receiving their 3-hours of Medicare-mandated therapy.   Team Interventions: Nursing Interventions Bladder Management, Disease Management/Prevention, Medication Management, Discharge Planning, Dysphagia/Aspiration Precaution Training, Pain Management, Bowel Management, Patient/Family Education  PT interventions Ambulation/gait training, Discharge planning, Functional mobility training, Psychosocial support, Therapeutic Activities, Visual/perceptual remediation/compensation, Balance/vestibular training, Disease management/prevention, Neuromuscular re-education, Skin care/wound management, Therapeutic Exercise, Wheelchair propulsion/positioning, Cognitive remediation/compensation, DME/adaptive equipment instruction, Pain management, Splinting/orthotics, UE/LE Strength taining/ROM, Community reintegration, Technical sales engineer stimulation, Patient/family education, IT trainer, UE/LE Coordination activities  OT Interventions Training and development officer, Discharge planning, Pain management, Self Care/advanced ADL retraining, Therapeutic Activities, UE/LE Coordination activities, Cognitive remediation/compensation, Disease mangement/prevention, Functional mobility training, Patient/family education, Therapeutic Exercise, Visual/perceptual remediation/compensation, UE/LE Strength taining/ROM, Neuromuscular re-education, DME/adaptive equipment instruction  SLP Interventions Speech/Language facilitation, Cueing hierarchy, Dysphagia/aspiration precaution training, Patient/family education, Therapeutic Activities  TR Interventions    SW/CM Interventions Discharge Planning, Psychosocial Support, Patient/Family Education   Barriers to Discharge MD  Medical stability  Nursing Decreased caregiver support, Incontinence, Nutrition means 1 level ramped home w son/daughter  PT Inaccessible home environment, Decreased caregiver support, Lack of/limited family support, Behavior, Other  (comments) age  OT      SLP      SW       Team Discharge Planning: Destination: PT-Home (vs SNF) ,OT- Home , SLP-Home Projected Follow-up: PT-Home health PT, 24 hour supervision/assistance, Skilled nursing facility, OT-  24 hour supervision/assistance, Home health OT, SLP-24 hour supervision/assistance, Other (comment) (TBD) Projected Equipment Needs: PT-To be determined, OT- To be determined, SLP-  Equipment Details: PT- , OT-  Patient/family involved in discharge planning: PT- Patient,  OT-Patient, Family member/caregiver, SLP-Patient, Family member/caregiver  MD ELOS: 2-3 weeks MinA Medical Rehab Prognosis:  Excellent Assessment: Sherry [name] is a 87 year old woman admitted to CIR with Left MCA CVA. Course complicated by constipation. Medications are being managed, and labs and vitals are being monitored regularly.      See Team Conference Notes for weekly updates to the plan of care

## 2021-05-30 NOTE — Progress Notes (Signed)
PROGRESS NOTE   Subjective/Complaints: Continues to feel well but is unsure why Her daughter at bedside notes that she often feels this way when she is constipated, which is a chronic issue for her. Discussed milk of magnesia  ROS: +malaise, +constipation   Objective:   No results found. Recent Labs    05/28/21 0226 05/29/21 0527  WBC 4.5 4.2  HGB 11.2* 12.3  HCT 33.7* 37.7  PLT 214 258   Recent Labs    05/28/21 0226 05/29/21 0527  NA 135 136  K 3.4* 4.2  CL 104 104  CO2 21* 24  GLUCOSE 87 104*  BUN 13 25*  CREATININE 0.90 0.87  CALCIUM 8.6* 9.0    Intake/Output Summary (Last 24 hours) at 05/30/2021 1419 Last data filed at 05/30/2021 1300 Gross per 24 hour  Intake 760 ml  Output --  Net 760 ml     Pressure Injury 05/24/21 Perineum Medial Stage 1 -  Intact skin with non-blanchable redness of a localized area usually over a bony prominence. (Active)  05/24/21 0830  Location: Perineum  Location Orientation: Medial  Staging: Stage 1 -  Intact skin with non-blanchable redness of a localized area usually over a bony prominence.  Wound Description (Comments):   Present on Admission: Yes    Physical Exam: Vital Signs Blood pressure (!) 156/87, pulse 97, temperature 98.1 F (36.7 C), temperature source Oral, resp. rate 18, height 5\' 1"  (1.549 m), weight 52.4 kg, SpO2 98 %. Gen: no distress, normal appearing HEENT: oral mucosa pink and moist, NCAT Cardio: Reg rate Chest: normal effort, normal rate of breathing Abd: soft, non-distended Ext: no edema Psych: pleasant, normal affect Skin: Keratotic horn on left shin.   Neuro: Decreased hearing. Oriented to self. Able to state age as 76 and DOB as 1/1. Delay in processing but she was able simple motor commands with verbal and tactile cues.  Right sided weakness noted --unable to perform MMT due to cognitive issues and reports of being cold/wanted to be covered.      Assessment/Plan: 1. Functional deficits which require 3+ hours per day of interdisciplinary therapy in a comprehensive inpatient rehab setting. Physiatrist is providing close team supervision and 24 hour management of active medical problems listed below. Physiatrist and rehab team continue to assess barriers to discharge/monitor patient progress toward functional and medical goals  Care Tool:  Bathing  Bathing activity did not occur: Safety/medical concerns Body parts bathed by patient: Right arm, Chest, Abdomen, Right upper leg, Left upper leg, Face   Body parts bathed by helper: Buttocks, Front perineal area, Left lower leg, Right lower leg     Bathing assist Assist Level: 2 Helpers (sit to stand)     Upper Body Dressing/Undressing Upper body dressing   What is the patient wearing?: Pull over shirt    Upper body assist Assist Level: Total Assistance - Patient < 25%    Lower Body Dressing/Undressing Lower body dressing      What is the patient wearing?: Pants     Lower body assist Assist for lower body dressing: Total Assistance - Patient < 25% (supine in bed rolling)     Toileting Toileting  Toileting assist Assist for toileting: 2 Helpers     Transfers Chair/bed transfer  Transfers assist     Chair/bed transfer assist level: 2 Helpers     Locomotion Ambulation   Ambulation assist      Assist level: 2 helpers Assistive device: Walker-rolling Max distance: 1   Walk 10 feet activity   Assist  Walk 10 feet activity did not occur: Safety/medical concerns        Walk 50 feet activity   Assist Walk 50 feet with 2 turns activity did not occur: Safety/medical concerns         Walk 150 feet activity   Assist Walk 150 feet activity did not occur: Safety/medical concerns         Walk 10 feet on uneven surface  activity   Assist Walk 10 feet on uneven surfaces activity did not occur: Safety/medical concerns          Wheelchair     Assist Is the patient using a wheelchair?: Yes Type of Wheelchair: Manual    Wheelchair assist level: Total Assistance - Patient < 25% Max wheelchair distance: 150    Wheelchair 50 feet with 2 turns activity    Assist        Assist Level: Total Assistance - Patient < 25%   Wheelchair 150 feet activity     Assist      Assist Level: Total Assistance - Patient < 25%   Blood pressure (!) 156/87, pulse 97, temperature 98.1 F (36.7 C), temperature source Oral, resp. rate 18, height 5\' 1"  (1.549 m), weight 52.4 kg, SpO2 98 %.  Medical Problem List and Plan: 1.  Left MCA CVA             -patient may shower             -ELOS/Goals: 2-3 weeks MinA             -check Vitamin D level today, pending  Continue CIR  -B/C vitamin complex started for stroke recovery 2.  Antithrombotics: -DVT/anticoagulation:  Pharmaceutical: Lovenox             -antiplatelet therapy: DAPT X 3 months-->to follow up with neurology for further input.  3. Pain: continue Tylenol #3 1/2 tab at bedtime daily             --tylenol prn  4. Mood: LCSW to follow for evaluation and support.              -antipsychotic agents: N/A 5. Neuropsych: This patient is not fully capable of making decisions on her own behalf. 6. Skin/Wound Care: Pressure relief measures.  --Turn every 2 hours while in bed.              --foam dressing for protection.  7. Fluids/Electrolytes/Nutrition: Monitor I/O.  8. A fib: Monitor HR TID--continue Cardizem 30 mg TID. Supplement IV magnesium for Mag <2, 1.8 today.  9. Chronic dysphagia: Will continue home diet of ensures with pureed food.   10. Persistent Hypokalemia: Will supplement today and tomorrow             --check CMET/mg level in am. 11. Chronic constipation:  milk of magnesia ordered 12. Hypertension: systolic blood pressure elevated to 140s- IV mag supplement today should help    LOS: 2 days A FACE TO FACE EVALUATION WAS  PERFORMED  Martha Clan P Burnadette Baskett 05/30/2021, 2:19 PM

## 2021-05-30 NOTE — Progress Notes (Signed)
Speech Language Pathology Daily Session Note  Patient Details  Name: Sherry [name] MRN: [id number] Date of Birth: [birth year]  Today's Date: 05/30/2021 SLP Individual Time: 1045-1130 SLP Individual Time Calculation (min): 45 min  Short Term Goals: Week 1: SLP Short Term Goal 1 (Week 1): Pt will consume current diet without overt s/sx of aspiration with min A verbal cues for use of swallowing compensatory strategies SLP Short Term Goal 2 (Week 1): Patient will utilize speech intelligiblity strategies at the word level with mod-to-max A verbal cues to achieve 75-90% intelligiblity SLP Short Term Goal 3 (Week 1): Pt will utilize call bell to request assistance with mod A verbal cues in 50% of observable opportunities SLP Short Term Goal 4 (Week 1): Pt will communicate functional needs to staff with mod-to-max A verbal cues SLP Short Term Goal 5 (Week 1): Pt will demonstrate awareness of speech errors during 50% of occasions with mod-to-max A verbal cues  Skilled Therapeutic Interventions: Pt received semi reclined in bed and agreeable to skilled ST intervention with focus on speech and swallowing goals. Pt was accompanied by daughter. Pt complained of lower back pain and concern for constipation. Nurse aware and addressing. Pt was actively sweating resulting in moist clothes and sheets. Daughter was encouraged by nurse and therapist to remove some blankets from patient. Daughter was adamant that pt needed multiple thick blankets on her. SLP provided patient with cool moist washcloth. Nurse arrived and encouraged to push fluids. SLP provided pt with thin water by straw in which patient consumed sequential sips without overt s/sx of aspiration. While consuming liquids SLP observed ivory colored coating on tongue. Notified nurse. SLP facilitated peri care for urine incontinence with total A and provided with new brief and shirt. Pt was able to communicate needs to therapist and daughter via answering open  ended and concrete yes/no questions to maximize comfort with min A verbal repetition. Patient was left in bed with alarm activated and immediate needs within reach at end of session. Continue per current plan of care.      Pain Pain Assessment Faces Pain Scale: Hurts little more Pain Location: Back Pain Orientation: Lower Pain Descriptors / Indicators: Cramping Pain Onset: Gradual Patients Stated Pain Goal: 2 Pain Intervention(s): RN made aware;Medication (See eMAR);Repositioned Multiple Pain Sites: No  Therapy/Group: Individual Therapy  [name] 05/30/2021, 12:20 PM

## 2021-05-31 DIAGNOSIS — I63512 Cerebral infarction due to unspecified occlusion or stenosis of left middle cerebral artery: Secondary | ICD-10-CM | POA: Diagnosis not present

## 2021-05-31 NOTE — Progress Notes (Signed)
Speech Language Pathology Daily Session Note  Patient Details  Name: Sherry [name] MRN: [id number] Date of Birth: 23-May-1920  Today's Date: 05/31/2021 SLP Individual Time: 7989-2119 SLP Individual Time Calculation (min): 45 min  Short Term Goals: Week 1: SLP Short Term Goal 1 (Week 1): Pt will consume current diet without overt s/sx of aspiration with min A verbal cues for use of swallowing compensatory strategies SLP Short Term Goal 2 (Week 1): Patient will utilize speech intelligiblity strategies at the word level with mod-to-max A verbal cues to achieve 75-90% intelligiblity SLP Short Term Goal 3 (Week 1): Pt will utilize call bell to request assistance with mod A verbal cues in 50% of observable opportunities SLP Short Term Goal 4 (Week 1): Pt will communicate functional needs to staff with mod-to-max A verbal cues SLP Short Term Goal 5 (Week 1): Pt will demonstrate awareness of speech errors during 50% of occasions with mod-to-max A verbal cues  Skilled Therapeutic Interventions: Pt seen for skilled ST with focus on speech and swallow goals, daughter present in beginning and end of session. Spoke with daughter about current diet and potential for diet trials of more advanced textures (Dys 2-3) however family would like to remain Dys 1 for all meals and snacks at this time. Pt requesting oral care, tongue with decreased "ivory coating" observed in previous ST session. Pt benefiting from min assist for oral care. HOB raised and pt tolerating successive sips of thin water via straw with no overt s/s aspiration or change in vocal quality. Pt denying Dys 1 snack d/t just finishing AM meal. SLP facilitating functional communication related to basic wants/needs in hospital by providing min-mod A cues. Speech intelligibility at sentence level perceived to be ~60% intelligible with max cues for overarticulation, slow rate and breath support. Pt does have increased awareness of why she is in the  hospital and effects of CVA ("my mouth and my speech") but remains with decreased awareness and repair of speech errors at this time. Pt left in bed with daughter present for needs. Cont ST POC.   Pain Pain Assessment Pain Scale: 0-10 Pain Score: 2  Pain Location: Back  Therapy/Group: Individual Therapy  Tacey Ruiz 05/31/2021, 8:35 AM

## 2021-05-31 NOTE — Progress Notes (Signed)
PROGRESS NOTE   Subjective/Complaints:  Pt said feels good but "not all the way"- daughter reports she had large BM with suppository last night, so they are moving again-   Since is hyperactive, will wait to increase bowel meds.   ROS: .limited by cognition- however still c/o constipation, in spite of BM from suppository  Objective:   No results found. Recent Labs    05/29/21 0527  WBC 4.2  HGB 12.3  HCT 37.7  PLT 258   Recent Labs    05/29/21 0527  NA 136  K 4.2  CL 104  CO2 24  GLUCOSE 104*  BUN 25*  CREATININE 0.87  CALCIUM 9.0    Intake/Output Summary (Last 24 hours) at 05/31/2021 1155 Last data filed at 05/31/2021 0801 Gross per 24 hour  Intake 600 ml  Output --  Net 600 ml         Physical Exam: Vital Signs Blood pressure 127/89, pulse 90, temperature 97.6 F (36.4 C), resp. rate 17, height 5\' 1"  (1.549 m), weight 52.4 kg, SpO2 100 %.   General: awake, alert, appropriate, wearing head wrap;  daughter at bedside; NAD HENT: conjugate gaze; oropharynx moist CV: regular rate; no JVD Pulmonary: CTA B/L; no W/R/R- good air movement GI: soft, NT, ND, (+)BS- hyperactive Psychiatric: appropriate Neurological: alert- HOH  Skin: Keratotic horn on left shin.   Neuro: Decreased hearing. Oriented to self. Able to state age as 59 and DOB as 1/1. Delay in processing but she was able simple motor commands with verbal and tactile cues.  Right sided weakness noted --unable to perform MMT due to cognitive issues and reports of being cold/wanted to be covered.     Assessment/Plan: 1. Functional deficits which require 3+ hours per day of interdisciplinary therapy in a comprehensive inpatient rehab setting. Physiatrist is providing close team supervision and 24 hour management of active medical problems listed below. Physiatrist and rehab team continue to assess barriers to discharge/monitor patient progress  toward functional and medical goals  Care Tool:  Bathing  Bathing activity did not occur: Safety/medical concerns Body parts bathed by patient: Right arm, Chest, Abdomen, Right upper leg, Left upper leg, Face   Body parts bathed by helper: Buttocks, Front perineal area, Left lower leg, Right lower leg     Bathing assist Assist Level: 2 Helpers (sit to stand)     Upper Body Dressing/Undressing Upper body dressing   What is the patient wearing?: Pull over shirt    Upper body assist Assist Level: Total Assistance - Patient < 25%    Lower Body Dressing/Undressing Lower body dressing      What is the patient wearing?: Pants     Lower body assist Assist for lower body dressing: 2 Helpers     Toileting Toileting    Toileting assist Assist for toileting: 2 Helpers     Transfers Chair/bed transfer  Transfers assist     Chair/bed transfer assist level: 2 Helpers     Locomotion Ambulation   Ambulation assist      Assist level: 2 helpers Assistive device: Walker-rolling Max distance: 1   Walk 10 feet activity   Assist  Walk 10  feet activity did not occur: Safety/medical concerns        Walk 50 feet activity   Assist Walk 50 feet with 2 turns activity did not occur: Safety/medical concerns         Walk 150 feet activity   Assist Walk 150 feet activity did not occur: Safety/medical concerns         Walk 10 feet on uneven surface  activity   Assist Walk 10 feet on uneven surfaces activity did not occur: Safety/medical concerns         Wheelchair     Assist Is the patient using a wheelchair?: Yes Type of Wheelchair: Manual    Wheelchair assist level: Total Assistance - Patient < 25% Max wheelchair distance: 150    Wheelchair 50 feet with 2 turns activity    Assist        Assist Level: Total Assistance - Patient < 25%   Wheelchair 150 feet activity     Assist      Assist Level: Total Assistance - Patient <  25%   Blood pressure 127/89, pulse 90, temperature 97.6 F (36.4 C), resp. rate 17, height 5\' 1"  (1.549 m), weight 52.4 kg, SpO2 100 %.  Medical Problem List and Plan: 1.  Left MCA CVA             -patient may shower             -ELOS/Goals: 2-3 weeks MinA             -check Vitamin D level today, pending  Con't CIR PT, OT and SLP  -B/C vitamin complex started for stroke recovery 2.  Antithrombotics: -DVT/anticoagulation:  Pharmaceutical: Lovenox             -antiplatelet therapy: DAPT X 3 months-->to follow up with neurology for further input.  3. Pain: continue Tylenol #3 1/2 tab at bedtime daily  11/12- denies pain, con't regimen             --tylenol prn  4. Mood: LCSW to follow for evaluation and support.              -antipsychotic agents: N/A 5. Neuropsych: This patient is not fully capable of making decisions on her own behalf. 6. Skin/Wound Care: Pressure relief measures.  --Turn every 2 hours while in bed.              --foam dressing for protection.  7. Fluids/Electrolytes/Nutrition: Monitor I/O.  8. A fib: Monitor HR TID--continue Cardizem 30 mg TID. Supplement IV magnesium for Mag <2, 1.8 today.  9. Chronic dysphagia: Will continue home diet of ensures with pureed food.   10. Persistent Hypokalemia: Will supplement today and tomorrow             --check CMET/mg level in am. 11. Chronic constipation:  milk of magnesia ordered  11/12- went with suppository last night- since hyperactive, and moving, will wait to add more meds.  12. Hypertension: systolic blood pressure elevated to 140s- IV mag supplement today should help  11/12- BP is better in last 24 hours- con't regimen      LOS: 3 days A FACE TO FACE EVALUATION WAS PERFORMED  Estreya Clay 05/31/2021, 11:55 AM

## 2021-05-31 NOTE — Progress Notes (Signed)
Physical Therapy Session Note  Patient Details  Name: Sherry [name] MRN: [id number] Date of Birth: 09-Apr-1920  Today's Date: 05/31/2021 PT Individual Time: 1415-1500 PT Individual Time Calculation (min): 45 min   Short Term Goals: Week 1:  PT Short Term Goal 1 (Week 1): Patient will complete sit <> supine with MaxA x1 PT Short Term Goal 2 (Week 1): Patient will transfer bed <> wc with LRAD and MaxA x1 PT Short Term Goal 3 (Week 1): Patient will ambulate 74ft with LRAD, MaxA x1 and wc follow as needed  Skilled Therapeutic Interventions/Progress Updates:  Patient semireclined in recliner on entrance to room. Dtr present. Pt initially requesting dtr not to leave pt alone. She requires calm encouragement from dtr that she is not going to leave her alone, but pt will need to participate in therapy session. Patient alert and agreeable to PT session. When blankets removed from pt, noted pt to be saral sitting with pt   Patient with mild pain complaint at start of session indicating pain at area of sacral pressure wound. Addressed with repositioning. During gait training, pt relates slight increase in pain at R hip and knee. Addressed with repositioning and reduced weight bearing. No numerical pain rating provided.   Therapeutic Activity: Transfers: Patient transfers recliner to w/c using STEDY and requires ModA for initial rise to stand. Maintains stance with BUE on STEDY bar with no physical assist. Rise to stand from modified stance on perch with close supervision. Rise to stand from lower height w/c requires Mod/ MaxA to power up.   Sit<>stands in parallel bars with MinA for power up.   Gait Training:  Patient ambulated 8 ft using parallel bars for UE support with CGA/ MinA. Demonstrated increased R knee flexion, decreased flor clearance with RLE, and crepitus felt in R knee/ hip. Pt relates some increase in pain to RLE with ambulation. Pain decreases once gait training ceases. Provided vc/ tc  for increasing step height and maintaining knee stability of RLE.  Patient positioned in improved position in ecliner at end of session with brakes locked, seat alarm set, and all needs within reach. Dtr present.  Therapy Documentation Precautions:  Precautions Precautions: Fall Precaution Comments: HOH, sacral PI, R hemi Restrictions Weight Bearing Restrictions: No General:   Pain:  Mild pain noted at pressure wound site and later at R hip/ knee. Both address with repositioning and decreased weight bearing.   Therapy/Group: Individual Therapy  Loel Dubonnet PT, DPT 05/31/2021, 7:50 PM

## 2021-05-31 NOTE — Progress Notes (Signed)
Physical Therapy Session Note  Patient Details  Name: Sherry [name] MRN: [id number] Date of Birth: May 13, 1920  Today's Date: 05/31/2021 PT Individual Time: 1100-1154 PT Individual Time Calculation (min): 54 min   Short Term Goals: Week 1:  PT Short Term Goal 1 (Week 1): Patient will complete sit <> supine with MaxA x1 PT Short Term Goal 2 (Week 1): Patient will transfer bed <> wc with LRAD and MaxA x1 PT Short Term Goal 3 (Week 1): Patient will ambulate 13ft with LRAD, MaxA x1 and wc follow as needed Week 2:    Week 3:     Skilled Therapeutic Interventions/Progress Updates:    Pt initially oob in wc. States she is uncomfortable in wc, states pain is "all over" but cannot apply a number to her pain.  Therapist provides mobilization and transfer to recliner as described below. Requests assist w/oral care.  Therapist provides total assist oral care using oral swab/rinse. Pt transported to gym.  Sit to stand in [place] w/total assist from wc using sheet under hips to assist therapist w/power up from front of pt, pt able to reach to bar.  .  Pt stands 1 min x 1, 45sec x 2.  From perched sit, pt able to transition to standing w/cga and additional time only.  In standing works on R knee extension and upright posture, mild r lean.  Pt transported via [facility] to room.  [place] moved to recliner.  Sit to stand in stedy w/cga, stand to sit to recliner w/min assist for controlled lowering.  Pt repositioned for comfort which takes extended time, pillows for postural support, copious blankets for warmth.  Pt left oob in recliner w/daughter at side and  needs in reach.     Therapy Documentation Precautions:  Precautions Precautions: Fall Precaution Comments: HOH, sacral PI, R hemi Restrictions Weight Bearing Restrictions: No    Therapy/Group: Individual Therapy [name], Pt  [name] 05/31/2021, 12:00 PM

## 2021-05-31 NOTE — Progress Notes (Signed)
Occupational Therapy Session Note  Patient Details  Name: Sherry [name] MRN: [id number] Date of Birth: Sep 20, 1919  Today's Date: 05/31/2021 OT Individual Time: 0903-1000 OT Individual Time Calculation (min): 57 min    Short Term Goals: Week 1:  OT Short Term Goal 1 (Week 1): Pt will self feed for 25% of meal with mod assist. OT Short Term Goal 2 (Week 1): Pt will complete UB dressing with mod assist to donn a pullover shirt. OT Short Term Goal 3 (Week 1): Pt will complete sit to stand for LB selfcare tasks with max assist. OT Short Term Goal 4 (Week 1): Pt will use the RUE at a gross assist level with min facilitation during selfcare tasks.  Skilled Therapeutic Interventions/Progress Updates:  Patient met lying supine in bed in agreement with OT treatment session. Mild pain reported "all over". Daughter reports patient with sore bottom secondary to decreased skin integrity. Patient able to advance BLE toward EOB with assist to elevate trunk. Sit to stand with use of stedy and Min A from elevated EOB. Toilet transfer in stedy with increased assistance for controlled descent to standard height commode in bathroom. Patient with wet brief. She did however void bladder and have BM on commode. Total A to don new brief and pants and complete hygiene management in standing with BUE supported on stedy. Initial assist to maintain position of RUE on bar of stedy. Hand hygiene standing in stedy at sink level with cues for anterior lean. Crepitus felt and heard in multiple joints. Total A for wc transport to and from ortho gym. Ace wrap used to maintain position of RUE on BUE arm ergometer and external assist required to produce enough power to operate. Patient tolerated 2 min x2 trials with 1 rest break. Session concluded with patient seated with call bell within reach, belt alarm activated and all needs met. Daughter present at bedside at conclusion of session.   Therapy Documentation Precautions:   Precautions Precautions: Fall Precaution Comments: HOH, sacral PI, R hemi Restrictions Weight Bearing Restrictions: No General:    Therapy/Group: Individual Therapy  Deland Slocumb R Howerton-Davis 05/31/2021, 7:12 AM

## 2021-06-01 DIAGNOSIS — K5901 Slow transit constipation: Secondary | ICD-10-CM

## 2021-06-01 DIAGNOSIS — I63512 Cerebral infarction due to unspecified occlusion or stenosis of left middle cerebral artery: Secondary | ICD-10-CM | POA: Diagnosis not present

## 2021-06-01 DIAGNOSIS — E876 Hypokalemia: Secondary | ICD-10-CM | POA: Diagnosis not present

## 2021-06-01 NOTE — Progress Notes (Signed)
PROGRESS NOTE   Subjective/Complaints: Pt in bed with daughter by side. Had a reasonable night. No new complaints. Back side still a little sore but nursing staff doing a good job of turning  ROS: Limited due to cognitive/behavioral   Objective:   No results found. No results for input(s): WBC, HGB, HCT, PLT in the last 72 hours.  No results for input(s): NA, K, CL, CO2, GLUCOSE, BUN, CREATININE, CALCIUM in the last 72 hours.   Intake/Output Summary (Last 24 hours) at 06/01/2021 0906 Last data filed at 06/01/2021 1749 Gross per 24 hour  Intake 960 ml  Output --  Net 960 ml         Physical Exam: Vital Signs Blood pressure 129/71, pulse 72, temperature 98.3 F (36.8 C), temperature source Oral, resp. rate 16, height 5\' 1"  (1.549 m), weight 52.4 kg, SpO2 99 %.   Constitutional: No distress . Vital signs reviewed. HEENT: NCAT, EOMI, oral membranes moist Neck: supple Cardiovascular: RRR without murmur. No JVD    Respiratory/Chest: CTA Bilaterally without wheezes or rales. Normal effort    GI/Abdomen: BS +, non-tender, non-distended Ext: no clubbing, cyanosis, or edema Psych: pleasant and cooperative  Skin: Keratotic horn on left shin.   Neuro: HOH. Oriented to self. Able to state age as 32 and DOB as 1/1. Follows basic commands.  Right sided weakness noted --moves all 4's   Assessment/Plan: 1. Functional deficits which require 3+ hours per day of interdisciplinary therapy in a comprehensive inpatient rehab setting. Physiatrist is providing close team supervision and 24 hour management of active medical problems listed below. Physiatrist and rehab team continue to assess barriers to discharge/monitor patient progress toward functional and medical goals  Care Tool:  Bathing  Bathing activity did not occur: Safety/medical concerns Body parts bathed by patient: Right arm, Chest, Abdomen, Right upper leg, Left upper  leg, Face   Body parts bathed by helper: Buttocks, Front perineal area, Left lower leg, Right lower leg     Bathing assist Assist Level: 2 Helpers (sit to stand)     Upper Body Dressing/Undressing Upper body dressing   What is the patient wearing?: Pull over shirt    Upper body assist Assist Level: Total Assistance - Patient < 25%    Lower Body Dressing/Undressing Lower body dressing      What is the patient wearing?: Pants     Lower body assist Assist for lower body dressing: 2 Helpers     Toileting Toileting    Toileting assist Assist for toileting: 2 Helpers     Transfers Chair/bed transfer  Transfers assist     Chair/bed transfer assist level: 2 Helpers     Locomotion Ambulation   Ambulation assist      Assist level: 2 helpers Assistive device: Walker-rolling Max distance: 1   Walk 10 feet activity   Assist  Walk 10 feet activity did not occur: Safety/medical concerns        Walk 50 feet activity   Assist Walk 50 feet with 2 turns activity did not occur: Safety/medical concerns         Walk 150 feet activity   Assist Walk 150 feet activity did  not occur: Safety/medical concerns         Walk 10 feet on uneven surface  activity   Assist Walk 10 feet on uneven surfaces activity did not occur: Safety/medical concerns         Wheelchair     Assist Is the patient using a wheelchair?: Yes Type of Wheelchair: Manual    Wheelchair assist level: Total Assistance - Patient < 25% Max wheelchair distance: 150    Wheelchair 50 feet with 2 turns activity    Assist        Assist Level: Total Assistance - Patient < 25%   Wheelchair 150 feet activity     Assist      Assist Level: Total Assistance - Patient < 25%   Blood pressure 129/71, pulse 72, temperature 98.3 F (36.8 C), temperature source Oral, resp. rate 16, height 5\' 1"  (1.549 m), weight 52.4 kg, SpO2 99 %.  Medical Problem List and Plan: 1.  Left  MCA CVA             -patient may shower             -ELOS/Goals: 2-3 weeks MinA             -check Vitamin D level today, pending  -Continue CIR therapies including PT, OT, and SLP   -B/C vitamin complex started for stroke recovery 2.  Antithrombotics: -DVT/anticoagulation:  Pharmaceutical: Lovenox             -antiplatelet therapy: DAPT X 3 months-->to follow up with neurology for further input.  3. Pain: continue Tylenol #3 1/2 tab at bedtime daily  11/13- denies pain, con't regimen             --tylenol prn  4. Mood: LCSW to follow for evaluation and support.              -antipsychotic agents: N/A 5. Neuropsych: This patient is not fully capable of making decisions on her own behalf. 6. Skin/Wound Care: Pressure relief measures.  --Turn every 2 hours while in bed.              --foam dressing for protection.  7. Fluids/Electrolytes/Nutrition: Monitor I/O.  8. A fib: Monitor HR TID--continue Cardizem 30 mg TID. Supplement IV magnesium for Mag <2, 1.8 today.  9. Chronic dysphagia: Will continue home diet of ensures with pureed food.   10. Persistent Hypokalemia: Will supplement today and tomorrow             --BMET monday. 11. Chronic constipation:  milk of magnesia ordered  11/13-moved bowels again yesterday  12. Hypertension: systolic blood pressure elevated to 140s- IV mag supplement today should help  11/13- BP is better controlled      LOS: 4 days A FACE TO FACE EVALUATION WAS PERFORMED  Meredith Staggers 06/01/2021, 9:06 AM

## 2021-06-02 DIAGNOSIS — I63512 Cerebral infarction due to unspecified occlusion or stenosis of left middle cerebral artery: Secondary | ICD-10-CM | POA: Diagnosis not present

## 2021-06-02 LAB — CBC
HCT: 35.4 % — ABNORMAL LOW (ref 36.0–46.0)
Hemoglobin: 11.6 g/dL — ABNORMAL LOW (ref 12.0–15.0)
MCH: 27.6 pg (ref 26.0–34.0)
MCHC: 32.8 g/dL (ref 30.0–36.0)
MCV: 84.1 fL (ref 80.0–100.0)
Platelets: 318 10*3/uL (ref 150–400)
RBC: 4.21 MIL/uL (ref 3.87–5.11)
RDW: 16.6 % — ABNORMAL HIGH (ref 11.5–15.5)
WBC: 3.5 10*3/uL — ABNORMAL LOW (ref 4.0–10.5)
nRBC: 0 % (ref 0.0–0.2)

## 2021-06-02 LAB — BASIC METABOLIC PANEL
Anion gap: 8 (ref 5–15)
BUN: 15 mg/dL (ref 8–23)
CO2: 26 mmol/L (ref 22–32)
Calcium: 9.2 mg/dL (ref 8.9–10.3)
Chloride: 99 mmol/L (ref 98–111)
Creatinine, Ser: 0.94 mg/dL (ref 0.44–1.00)
GFR, Estimated: 55 mL/min — ABNORMAL LOW (ref >60)
Glucose, Bld: 95 mg/dL (ref 70–99)
Potassium: 4.3 mmol/L (ref 3.5–5.1)
Sodium: 133 mmol/L — ABNORMAL LOW (ref 135–145)

## 2021-06-02 MED ORDER — VITAMIN D 25 MCG (1000 UNIT) PO TABS
1000.0000 [IU] | ORAL_TABLET | Freq: Every day | ORAL | Status: DC
  Administered 2021-06-02 – 2021-06-11 (×10): 1000 [IU] via ORAL
  Filled 2021-06-02 (×10): qty 1

## 2021-06-02 MED ORDER — MAGNESIUM HYDROXIDE 400 MG/5ML PO SUSP: 30.0000 mL | Freq: Every day | ORAL | Status: DC | PRN

## 2021-06-02 NOTE — Progress Notes (Signed)
PROGRESS NOTE   Subjective/Complaints: Daughter at bedside reports that she felt much better after having a BP and has been able to tolerate therapy better since.  Labs stable today  ROS: Limited due to cognitive/behavioral   Objective:   No results found. Recent Labs    06/02/21 0517  WBC 3.5*  HGB 11.6*  HCT 35.4*  PLT 318    Recent Labs    06/02/21 0517  NA 133*  K 4.3  CL 99  CO2 26  GLUCOSE 95  BUN 15  CREATININE 0.94  CALCIUM 9.2     Intake/Output Summary (Last 24 hours) at 06/02/2021 0958 Last data filed at 06/01/2021 1807 Gross per 24 hour  Intake 540 ml  Output --  Net 540 ml         Physical Exam: Vital Signs Blood pressure 127/70, pulse 67, temperature 98 F (36.7 C), temperature source Oral, resp. rate 16, height 5\' 1"  (1.549 m), weight 52.4 kg, SpO2 98 %.   Constitutional: No distress . Vital signs reviewed. HEENT: NCAT, EOMI, oral membranes moist Neck: supple Cardiovascular: RRR without murmur. No JVD    Respiratory/Chest: CTA Bilaterally without wheezes or rales. Normal effort    GI/Abdomen: BS +, non-tender, non-distended Ext: no clubbing, cyanosis, or edema Psych: pleasant and cooperative, expresses gratitude Skin: Keratotic horn on left shin.   Neuro: HOH. Oriented to self. Able to state age as 62 and DOB as 1/1. Follows basic commands.  Right sided weakness noted --moves all 4's   Assessment/Plan: 1. Functional deficits which require 3+ hours per day of interdisciplinary therapy in a comprehensive inpatient rehab setting. Physiatrist is providing close team supervision and 24 hour management of active medical problems listed below. Physiatrist and rehab team continue to assess barriers to discharge/monitor patient progress toward functional and medical goals  Care Tool:  Bathing  Bathing activity did not occur: Safety/medical concerns Body parts bathed by patient: Right  arm, Chest, Abdomen, Right upper leg, Left upper leg, Face   Body parts bathed by helper: Buttocks, Front perineal area, Left lower leg, Right lower leg     Bathing assist Assist Level: 2 Helpers (sit to stand)     Upper Body Dressing/Undressing Upper body dressing   What is the patient wearing?: Pull over shirt    Upper body assist Assist Level: Total Assistance - Patient < 25%    Lower Body Dressing/Undressing Lower body dressing      What is the patient wearing?: Pants     Lower body assist Assist for lower body dressing: 2 Helpers     Toileting Toileting    Toileting assist Assist for toileting: 2 Helpers     Transfers Chair/bed transfer  Transfers assist     Chair/bed transfer assist level: 2 Helpers     Locomotion Ambulation   Ambulation assist      Assist level: 2 helpers Assistive device: Walker-rolling Max distance: 1   Walk 10 feet activity   Assist  Walk 10 feet activity did not occur: Safety/medical concerns        Walk 50 feet activity   Assist Walk 50 feet with 2 turns activity did not occur:  Safety/medical concerns         Walk 150 feet activity   Assist Walk 150 feet activity did not occur: Safety/medical concerns         Walk 10 feet on uneven surface  activity   Assist Walk 10 feet on uneven surfaces activity did not occur: Safety/medical concerns         Wheelchair     Assist Is the patient using a wheelchair?: Yes Type of Wheelchair: Manual    Wheelchair assist level: Total Assistance - Patient < 25% Max wheelchair distance: 150    Wheelchair 50 feet with 2 turns activity    Assist        Assist Level: Total Assistance - Patient < 25%   Wheelchair 150 feet activity     Assist      Assist Level: Total Assistance - Patient < 25%   Blood pressure 127/70, pulse 67, temperature 98 F (36.7 C), temperature source Oral, resp. rate 16, height 5\' 1"  (1.549 m), weight 52.4 kg, SpO2 98  %.  Medical Problem List and Plan: 1.  Left MCA CVA             -patient may shower             -ELOS/Goals: 2-3 weeks MinA  -Continue CIR therapies including PT, OT, and SLP   -B/C vitamin complex started for stroke recovery 2.  Antithrombotics: -DVT/anticoagulation:  Pharmaceutical: Lovenox             -antiplatelet therapy: DAPT X 3 months-->to follow up with neurology for further input.  3. Pain: continue Tylenol #3 1/2 tab at bedtime daily  11/13- denies pain, con't regimen             --tylenol prn  4. Mood: LCSW to follow for evaluation and support.              -antipsychotic agents: N/A 5. Neuropsych: This patient is not fully capable of making decisions on her own behalf. 6. Skin/Wound Care: Pressure relief measures.  --Turn every 2 hours while in bed.              --foam dressing for protection.  7. Fluids/Electrolytes/Nutrition: Monitor I/O.  8. A fib: Monitor HR TID--continue Cardizem 30 mg TID. Supplement IV magnesium for Mag <2, 1.8 today.  9. Chronic dysphagia: Will continue home diet of ensures with pureed food.   10. Persistent Hypokalemia: Will supplement today and tomorrow             --BMET monday. 11. Chronic constipation:  resolved with milk of magnesia, ordered daily PRN 12. Hypertension: resolved with magnesium supplementation, continue to monitor TID.  13. Suboptimal vitamin D: start on 1,00U daily supplement 14. Disposition: messaged April to schedule hospital follow-up with me.       LOS: 5 days A FACE TO FACE EVALUATION WAS PERFORMED  May Dannon Perlow 06/02/2021, 9:58 AM

## 2021-06-02 NOTE — Progress Notes (Signed)
Occupational Therapy Session Note  Patient Details  Name: Sherry [name] MRN: [id number] Date of Birth: 1920/04/15  Today's Date: 06/02/2021 OT Individual Time: 1335-1430 OT Individual Time Calculation (min): 55 min    Short Term Goals: Week 1:  OT Short Term Goal 1 (Week 1): Pt will self feed for 25% of meal with mod assist. OT Short Term Goal 2 (Week 1): Pt will complete UB dressing with mod assist to donn a pullover shirt. OT Short Term Goal 3 (Week 1): Pt will complete sit to stand for LB selfcare tasks with max assist. OT Short Term Goal 4 (Week 1): Pt will use the RUE at a gross assist level with min facilitation during selfcare tasks.  Skilled Therapeutic Interventions/Progress Updates:    Pt sitting up in recliner stating "My down there hurts" referring to her bottom.  Pt instructed through forward weight shifting task to touch her feet with BUE as well as chair push ups to promote pressure redistribution.  Pt needing max multimodal cues and repetitive instruction to follow commands somewhat due to pt very HOH as well as slow processing.  Pt completed 2 x 10 of each.  Pt then completed BUE forward flexion to end range 2 x 15 reps to increase BUE strength and overall endurance.  Sit<>stand blocked practice x 2 trials at [facility] requiring min assist with posterior bias although improved second trial after visual demonstration and education on weight shifting provided and able to stand momentarily with CGA.  Pt completed stand pivot with mod assist using RW.  Sit to supine with total assist.  Positioned pt in right sidelying to reduce pressure on buttocks with total assist.  Call bell in reach, bed alarm on.  Therapy Documentation Precautions:  Precautions Precautions: Fall Precaution Comments: HOH, sacral PI, R hemi Restrictions Weight Bearing Restrictions: No   Therapy/Group: Individual Therapy  Amie Critchley 06/02/2021, 2:54 PM

## 2021-06-02 NOTE — Progress Notes (Signed)
Physical Therapy Session Note  Patient Details  Name: Sherry [name] MRN: [id number] Date of Birth: 28-Aug-1919  Today's Date: 06/02/2021 PT Individual Time: 1000-1054 PT Individual Time Calculation (min): 54 min   Short Term Goals: Week 1:  PT Short Term Goal 1 (Week 1): Patient will complete sit <> supine with MaxA x1 PT Short Term Goal 2 (Week 1): Patient will transfer bed <> wc with LRAD and MaxA x1 PT Short Term Goal 3 (Week 1): Patient will ambulate 59f with LRAD, MaxA x1 and wc follow as needed  Skilled Therapeutic Interventions/Progress Updates:     Pt seen supine in bed with daughter at the bedside. Pt agreeable to PT tx with some encouragement. Donned disposable pants at bed level with maxA - she was able to roll towards her weaker R side with minA and use of bed features, required maxA for rolling towards her L side. Supine<>sit with HOB elevated and modA, primarily for trunk management. Requires maxA for forward scooting to EOB. Daughter assisted with donning robe while she required modA for unsupported sitting balance due to posterior lean. Completed stand<>pivot transfer with max/totalA (pt ~25%) from EOB to w/c and then transported to day room rehab gym for time and energy conservation. Completed x8 minutes of seated Kinetron with multiple brief rest breaks, resistance set to 70cm/sec. She also participated in seated activities in her w/c including unweighted ball toss, unweighted ball raises, LAQ, and forward/backward leans to large therapy ball to promote forward weight shift. Rest breaks provided as needed. Motor planning deficits noted and adaptations needed for RUE weakness. Pt returned to her room and assisted to recliner via stand<>pivot transfer with maxA. Required +2 totalA for repositioning and backward scooting in the recliner. BLE elevated, all needs met, daughter at the bedside.   Therapy Documentation Precautions:  Precautions Precautions: Fall Precaution Comments:  HOH, sacral PI, R hemi Restrictions Weight Bearing Restrictions: No General:    Therapy/Group: Individual Therapy  CAlger Simons11/14/2022, 7:47 AM

## 2021-06-02 NOTE — Progress Notes (Signed)
Occupational Therapy Session Note  Patient Details  Name: Sherry [name] MRN: [id number] Date of Birth: 02-06-1920  Today's Date: 06/02/2021 OT Individual Time: 3016-0109 OT Individual Time Calculation (min): 40 min    Short Term Goals: Week 1:  OT Short Term Goal 1 (Week 1): Pt will self feed for 25% of meal with mod assist. OT Short Term Goal 2 (Week 1): Pt will complete UB dressing with mod assist to donn a pullover shirt. OT Short Term Goal 3 (Week 1): Pt will complete sit to stand for LB selfcare tasks with max assist. OT Short Term Goal 4 (Week 1): Pt will use the RUE at a gross assist level with min facilitation during selfcare tasks.  Skilled Therapeutic Interventions/Progress Updates:    Pt resting in bed upon arrival with daughter present. RN present. Pt incontinent of bladder and required assistance for hygiene and changing brief. Pt dependent for task. Rolling R/L in bed with max A. Supine>sit EOB with min A. Pt able to perform reciprocal scoot to EOB. Sit<>stand X 3 with max A. Pt required max A to scoot towards to [facility]. Sit>supine with max A. Dependent for repostioning in bed. Pt remained in bed with all needs within reach. Bed alarm activated.   Therapy Documentation Precautions:  Precautions Precautions: Fall Precaution Comments: HOH, sacral PI, R hemi Restrictions Weight Bearing Restrictions: No   Pain: Pt reports her "bottom" hurts; activity and repositioned  Therapy/Group: Individual Therapy  Rich Brave 06/02/2021, 9:05 AM

## 2021-06-02 NOTE — Progress Notes (Signed)
Speech Language Pathology Daily Session Note  Patient Details  Name: Sherry [name] MRN: [id number] Date of Birth: 1920/05/02  Today's Date: 06/02/2021 SLP Individual Time: 8469-6295 SLP Individual Time Calculation (min): 23 min  Short Term Goals: Week 1: SLP Short Term Goal 1 (Week 1): Pt will consume current diet without overt s/sx of aspiration with min A verbal cues for use of swallowing compensatory strategies SLP Short Term Goal 2 (Week 1): Patient will utilize speech intelligiblity strategies at the word level with mod-to-max A verbal cues to achieve 75-90% intelligiblity SLP Short Term Goal 3 (Week 1): Pt will utilize call bell to request assistance with mod A verbal cues in 50% of observable opportunities SLP Short Term Goal 4 (Week 1): Pt will communicate functional needs to staff with mod-to-max A verbal cues SLP Short Term Goal 5 (Week 1): Pt will demonstrate awareness of speech errors during 50% of occasions with mod-to-max A verbal cues  Skilled Therapeutic Interventions: Pt seen for skilled ST with focus on swallowing and speech goals, pt in recliner with daughter present throughout. Pt with decreased verbalizations this date, requiring cues and encouragement to increase length of responses to questions. Daughter reports pt wants to be able to speak like she used to, is self-conscious about change in voice/speech. Pt encouraged to increase verbalizations throughout day with family and staff with use of strategies as trained (overarticulation, loud, slow). Pt agreeable to a few bites Dys 1 snack, tolerating with no overt s/s aspiration. Daughter states pt appetite has increased since having BM, consuming Dys 1 trays adequately. Pt left in recliner with daughter present for all needs. Cont ST POC.   Pain Pain Assessment Pain Scale: 0-10 Pain Score: 0-No pain Faces Pain Scale: Hurts whole lot Pain Location: Sacrum Pain Intervention(s): Repositioned  Therapy/Group: Individual  Therapy  Tacey Ruiz 06/02/2021, 11:29 AM

## 2021-06-03 ENCOUNTER — Inpatient Hospital Stay (HOSPITAL_COMMUNITY): Payer: Medicare Other

## 2021-06-03 DIAGNOSIS — I63512 Cerebral infarction due to unspecified occlusion or stenosis of left middle cerebral artery: Secondary | ICD-10-CM | POA: Diagnosis not present

## 2021-06-03 LAB — TROPONIN I (HIGH SENSITIVITY)
Troponin I (High Sensitivity): 7 ng/L (ref <18)
Troponin I (High Sensitivity): 7 ng/L (ref <18)

## 2021-06-03 NOTE — Progress Notes (Signed)
Reviewed lab result and chest Xray. Awaiting on EKG results. Placed a call to [address] and spoke to Wyatt, she will call again for EKG. Awaiting results.

## 2021-06-03 NOTE — Progress Notes (Signed)
EKG : Atrial Flutter:  Sherry [name] denies chest pain or SOB at this time, [facility] called: Spoke with Dr Joyce Gross, he reviewed her EKG and stated "to continue to monitor at this time.  Placed a call to her nurse Nettie Elm regarding the above, she verbalizes understanding.

## 2021-06-03 NOTE — Progress Notes (Signed)
Pt c/o SOB at this time. Pt denies chest pain. Lungs clear bilaterally. O2 is 100%, BP 139/70. PA Rinaldo Cloud notified.No new orders at this time. Pt also c/o of sore throat, pt encouraged to do oral cares. Pt nodded yeah yes in agreement. Mylo Red, LPN

## 2021-06-03 NOTE — Discharge Instructions (Addendum)
Inpatient Rehab Discharge Instructions  Sherry [name] Discharge date and time:  06/11/21  Activities/Precautions/ Functional Status: Activity: activity as tolerated with assistance Diet: cardiac diet-pureed foods. Assist with meals--make sure that patient is upright and stays upright for 45 minutes after eating.  Wound Care: none needed   Functional status:  ___ No restrictions     ___ Walk up steps independently _X__ 24/7 supervision/assistance   ___ Walk up steps with assistance ___ Intermittent supervision/assistance  ___ Bathe/dress independently ___ Walk with walker     __X_ Bathe/dress with assistance ___ Walk Independently    ___ Shower independently _X__ Walk with assistance    ___ Shower with assistance _X__ No alcohol     ___ Return to work/school ________   Special Instructions:    COMMUNITY REFERRALS UPON DISCHARGE:    Home Health:   PT, SP, RN                  Agency: [facility] Phone:[phone]  RESUME PCS SERVICES-80 HOURS PER MONTH DAUGHTER WANTS PLACED ON CAP WAITING LIST AWARE OF LENGTH OF LIST  Medical Equipment/Items Ordered:HAS ALL NEEDED EQUIPMENT FROM PAST ADMISSIONS                                                 Agency/Supplier: NA     STROKE/TIA DISCHARGE INSTRUCTIONS SMOKING Cigarette smoking nearly doubles your risk of having a stroke & is the single most alterable risk factor  If you smoke or have smoked in the last 12 months, you are advised to quit smoking for your health. Most of the excess cardiovascular risk related to smoking disappears within a year of stopping. Ask you doctor about anti-smoking medications [place] Quit Line: 1-800-QUIT NOW Free Smoking Cessation Classes [phone]  CHOLESTEROL Know your levels; limit fat & cholesterol in your diet  Lipid Panel     Component Value Date/Time   CHOL 208 (H) 05/24/2021 0715   TRIG 100 05/24/2021 0715   HDL 53 05/24/2021 0715   CHOLHDL 3.9 05/24/2021 0715   VLDL 20  05/24/2021 0715   LDLCALC 135 (H) 05/24/2021 0715     Many patients benefit from treatment even if their cholesterol is at goal. Goal: Total Cholesterol (CHOL) less than 160 Goal:  Triglycerides (TRIG) less than 150 Goal:  HDL greater than 40 Goal:  LDL (LDLCALC) less than 100   BLOOD PRESSURE American Stroke Association blood pressure target is less that 120/80 mm/Hg  Your discharge blood pressure is:  BP: 139/73 Monitor your blood pressure Limit your salt and alcohol intake Many individuals will require more than one medication for high blood pressure  DIABETES (A1c is a blood sugar average for last 3 months) Goal HGBA1c is under 7% (HBGA1c is blood sugar average for last 3 months)  Diabetes:     Lab Results  Component Value Date   HGBA1C 5.3 05/24/2021    Your HGBA1c can be lowered with medications, healthy diet, and exercise. Check your blood sugar as directed by your physician Call your physician if you experience unexplained or low blood sugars.  PHYSICAL ACTIVITY/REHABILITATION Goal is 30 minutes at least 4 days per week  Activity: see above Therapies: see above Return to work: N/A Activity decreases your risk of heart attack and stroke and makes your heart stronger.  It helps control your weight  and blood pressure; helps you relax and can improve your mood. Participate in a regular exercise program. Talk with your doctor about the best form of exercise for you (dancing, walking, swimming, cycling).  DIET/WEIGHT Goal is to maintain a healthy weight  Your discharge diet is:  Diet Order             DIET - DYS 1 Room service appropriate? Yes with Assist; Fluid consistency: Thin  Diet effective now                   liquids Your height is:  Height: 5\' 1"  (154.9 cm) Your current weight is: Weight: 53.5 kg Your Body Mass Index (BMI) is:  BMI (Calculated): 22.3 Following the type of diet specifically designed for you will help prevent another stroke. Your goal weight  range is:   Your goal Body Mass Index (BMI) is 19-24. Healthy food habits can help reduce 3 risk factors for stroke:  High cholesterol, hypertension, and excess weight.  RESOURCES Stroke/Support Group:  Call [phone]   STROKE EDUCATION PROVIDED/REVIEWED AND GIVEN TO PATIENT Stroke warning signs and symptoms How to activate emergency medical system (call 911). Medications prescribed at discharge. Need for follow-up after discharge. Personal risk factors for stroke. Pneumonia vaccine given:  Flu vaccine given:  My questions have been answered, the writing is legible, and I understand these instructions.  I will adhere to these goals & educational materials that have been provided to me after my discharge from the hospital.      My questions have been answered and I understand these instructions. I will adhere to these goals and the provided educational materials after my discharge from the hospital.  Patient/Caregiver Signature _______________________________ Date __________  Clinician Signature _______________________________________ Date __________  Please bring this form and your medication list with you to all your follow-up doctor's appointments.

## 2021-06-03 NOTE — Progress Notes (Signed)
Placed another call regarding EKG results. Nettie Elm states the EKG was performed it's not in Epic to be reviewed, she was asked to get another EKG, if unable to transmit into Epic, to call rapid nurse to review the EKG, she verbalizes understanding.

## 2021-06-03 NOTE — Progress Notes (Signed)
Occupational Therapy Session Note  Patient Details  Name: Sherry [name] MRN: [id number] Date of Birth: 02/19/1920  Today's Date: 06/03/2021 OT Individual Time: 1000-1100 OT Individual Time Calculation (min): 60 min    Short Term Goals: Week 1:  OT Short Term Goal 1 (Week 1): Pt will self feed for 25% of meal with mod assist. OT Short Term Goal 2 (Week 1): Pt will complete UB dressing with mod assist to donn a pullover shirt. OT Short Term Goal 3 (Week 1): Pt will complete sit to stand for LB selfcare tasks with max assist. OT Short Term Goal 4 (Week 1): Pt will use the RUE at a gross assist level with min facilitation during selfcare tasks.  Skilled Therapeutic Interventions/Progress Updates:    Pt semi reclined in bed, requesting to use the bathroom.  Pt required total assist supine to sit and stand pivot transfer to [facility] needing max multimodal cues and increased time using RW.  Total assist clothing management.  Pt had continent episode of bowel and bladder however pt was unaware of BM event.  Pt having difficulty maintaining standing balance therefore returned to bed level with max assist for Stand pivot and total assist sit to supine with total assist for dependent pericare and brief donning.  Pt returned to sitting with total assist.  Stedy transfer to w/c with mod assist for sit<>stand.  Pt doffed shirt and donned clean long sleeve shirt with max assist.  Pt donned pants with max assist as well.  Requesting to return to bed due to next therapy not for another 3 hours.  Stedy to EOB with mod assist sit<>stand.  Total assist sit to supine.  Heels offloaded with pillow support.  Call bell in reach, bed alarm on.   Therapy Documentation Precautions:  Precautions Precautions: Fall Precaution Comments: HOH, sacral PI, R hemi Restrictions Weight Bearing Restrictions: No    Therapy/Group: Individual Therapy  Amie Critchley 06/03/2021, 3:15 PM

## 2021-06-03 NOTE — Progress Notes (Signed)
Lying in bed NAD--breathing non labored without wheezing or rhonchi. HOH--had to use daughter (from out of town) to help facilitate communication with patient. She reports chest hurting and has been short of breath "half the day" and nurse was here to check her out.  On exam non specific chest and abdominal tenderness noted. Will order EKG and CXR for work up.

## 2021-06-03 NOTE — Progress Notes (Signed)
Physical Therapy Session Note  Patient Details  Name: Sherry [name] MRN: [id number] Date of Birth: [birth year]  Today's Date: 06/03/2021 PT Individual Time: 1650-1735 PT Individual Time Calculation (min): 45 min   Short Term Goals: Week 1:  PT Short Term Goal 1 (Week 1): Patient will complete sit <> supine with MaxA x1 PT Short Term Goal 2 (Week 1): Patient will transfer bed <> wc with LRAD and MaxA x1 PT Short Term Goal 3 (Week 1): Patient will ambulate 12ft with LRAD, MaxA x1 and wc follow as needed  Skilled Therapeutic Interventions/Progress Updates:    Per chart, pt has been experiencing SOB but no new orders or restrictions placed for therapy. Pt received supine in bed with her daughter and son present. Pt reports "feeling bad" but unable to clarify, later states "I don't feel that bad, but I don't feel good either." With min encouragement pt agreeable to therapy session. SpO2 99% and HR 73bpm lying supine in bed. Pam, PA present to assess patient and reports pt may participate in room level therapy per pt tolerance. Lab tech in/out for blood draw. Vitals in supine: BP 128/78 (MAP 92), HR 65bpm, SpO2 99%  Supine>sitting L EOB, HOB slightly elevated and using bedrails, providing significant increased time with pt requiring min assist for trunk upright but mod assist via bed pads to scoot hips towards EOB. Sitting EOB vitals: BP 127/74 (MAP 90), HR 79bpm, SpO2 100%  No reports of SOB during therapy session but pt would note to have increased respiratory rate as expected with exercise.  Sitting EOB participated in the following:  - x10 reps long arc quads with CGA for trunk control due to gradual R posterior lean  - x10reps each LE ankle pumps with CGA/min assist for trunk as just described  - cross body alternating arm reaches focusing on anterior trunk lean  - R UE cross body arm reaches focusing on sitting balance with trunk rotation, supervision for safety and intermittent verbal/visual  cuing to prevent posterior trunk LOB  Sit>supine max assist for trunk descent and B LE management onto bed. +2 present to provide dependent scoot towards [facility] then pt noted to be incontinent of bladder - NT took over care of patient.  Therapy Documentation Precautions:  Precautions Precautions: Fall Precaution Comments: HOH, sacral PI, R hemi Restrictions Weight Bearing Restrictions: No   Pain:  No reports of pain throughout session.  Therapy/Group: Individual Therapy  Ginny Forth , PT, DPT, NCS, CSRS 06/03/2021, 5:02 PM

## 2021-06-03 NOTE — Progress Notes (Signed)
Results of triponin called to on call MD [name]. No new orders at this time. Waiting on EKG or chest xray. Mylo Red, LPN

## 2021-06-03 NOTE — Progress Notes (Signed)
PROGRESS NOTE   Subjective/Complaints: She initially reported no concerns this morning Working with SLP who notes she was complaining of a spot of pain on her sacrum- discussed with patient the importance of offloading q2H  ROS: Limited due to cognitive/behavioral   Objective:   No results found. Recent Labs    06/02/21 0517  WBC 3.5*  HGB 11.6*  HCT 35.4*  PLT 318    Recent Labs    06/02/21 0517  NA 133*  K 4.3  CL 99  CO2 26  GLUCOSE 95  BUN 15  CREATININE 0.94  CALCIUM 9.2     Intake/Output Summary (Last 24 hours) at 06/03/2021 0945 Last data filed at 06/03/2021 0528 Gross per 24 hour  Intake 760 ml  Output --  Net 760 ml         Physical Exam: Vital Signs Blood pressure 132/76, pulse 81, temperature 98.2 F (36.8 C), temperature source Oral, resp. rate 15, height 5\' 1"  (1.549 m), weight 52.4 kg, SpO2 100 %. Constitutional: No distress . Vital signs reviewed. Wrapped up in her quilt working with SLP HEENT: NCAT, EOMI, oral membranes moist Neck: supple Cardiovascular: RRR without murmur. No JVD    Respiratory/Chest: CTA Bilaterally without wheezes or rales. Normal effort    GI/Abdomen: BS +, non-tender, non-distended Ext: no clubbing, cyanosis, or edema Psych: pleasant and cooperative, expresses gratitude Skin: Keratotic horn on left shin.   Neuro: HOH. Oriented to self. Able to state age as 32 and DOB as 1[birth year]. Follows basic commands.  Right sided weakness noted --moves all 4's   Assessment/Plan: 1. Functional deficits which require 3+ hours per day of interdisciplinary therapy in a comprehensive inpatient rehab setting. Physiatrist is providing close team supervision and 24 hour management of active medical problems listed below. Physiatrist and rehab team continue to assess barriers to discharge/monitor patient progress toward functional and medical goals  Care Tool:  Bathing  Bathing  activity did not occur: Safety/medical concerns Body parts bathed by patient: Right arm, Chest, Abdomen, Right upper leg, Left upper leg, Face   Body parts bathed by helper: Buttocks, Front perineal area, Left lower leg, Right lower leg     Bathing assist Assist Level: 2 Helpers (sit to stand)     Upper Body Dressing/Undressing Upper body dressing   What is the patient wearing?: Pull over shirt    Upper body assist Assist Level: Total Assistance - Patient < 25%    Lower Body Dressing/Undressing Lower body dressing      What is the patient wearing?: Pants     Lower body assist Assist for lower body dressing: 2 Helpers     Toileting Toileting    Toileting assist Assist for toileting: 2 Helpers     Transfers Chair/bed transfer  Transfers assist     Chair/bed transfer assist level: 2 Helpers     Locomotion Ambulation   Ambulation assist      Assist level: 2 helpers Assistive device: Walker-rolling Max distance: 1   Walk 10 feet activity   Assist  Walk 10 feet activity did not occur: Safety/medical concerns        Walk 50 feet activity  Assist Walk 50 feet with 2 turns activity did not occur: Safety/medical concerns         Walk 150 feet activity   Assist Walk 150 feet activity did not occur: Safety/medical concerns         Walk 10 feet on uneven surface  activity   Assist Walk 10 feet on uneven surfaces activity did not occur: Safety/medical concerns         Wheelchair     Assist Is the patient using a wheelchair?: Yes Type of Wheelchair: Manual    Wheelchair assist level: Total Assistance - Patient < 25% Max wheelchair distance: 150    Wheelchair 50 feet with 2 turns activity    Assist        Assist Level: Total Assistance - Patient < 25%   Wheelchair 150 feet activity     Assist      Assist Level: Total Assistance - Patient < 25%   Blood pressure 132/76, pulse 81, temperature 98.2 F (36.8 C),  temperature source Oral, resp. rate 15, height 5\' 1"  (1.549 m), weight 52.4 kg, SpO2 100 %.  Medical Problem List and Plan: 1.  Left MCA CVA             -patient may shower             -ELOS/Goals: 2-3 weeks MinA  -Continue CIR therapies including PT, OT, and SLP   -B/C vitamin complex started for stroke recovery 2.  Antithrombotics: -DVT/anticoagulation:  Pharmaceutical: Lovenox             -antiplatelet therapy: DAPT X 3 months-->to follow up with neurology for further input.  3. Pain: continue Tylenol #3 1/2 tab at bedtime daily  11/13- denies pain, con't regimen             --tylenol prn  4. Mood: LCSW to follow for evaluation and support.              -antipsychotic agents: N/A 5. Neuropsych: This patient is not fully capable of making decisions on her own behalf. 6. Skin/Wound Care: Pressure relief measures.  --Turn every 2 hours while in bed.              --foam dressing for protection.  7. Fluids/Electrolytes/Nutrition: Monitor I/O.  8. A fib: Monitor HR TID--continue Cardizem 30 mg TID. Supplement IV magnesium for Mag <2 9. Chronic dysphagia: Will continue home diet of ensures with pureed food.   10. Persistent Hypokalemia: Will supplement today and tomorrow             --BMET monday. 11. Chronic constipation:  resolved with milk of magnesia, ordered daily PRN. Messaged Caryl Pina regarding when her last BM was.  12. Hypertension: resolved with magnesium supplementation, continue to monitor TID.  13. Suboptimal vitamin D: start on 1,00U daily supplement 14. Sacral pain: offload q2H to prevent pressure injury 15. Disposition: messaged April to schedule hospital follow-up with me.       LOS: 6 days A FACE TO FACE EVALUATION WAS PERFORMED  Martha Clan P Letty Salvi 06/03/2021, 9:45 AM

## 2021-06-03 NOTE — Progress Notes (Signed)
Physical Therapy Session Note  Patient Details  Name: Sherry [name] MRN: [id number] Date of Birth: [birth year]  Today's Date: 06/03/2021 PT Individual Time: 3335-4562 + 1345-1440 PT Individual Time Calculation (min): 26 min  + 55 min  Short Term Goals: Week 1:  PT Short Term Goal 1 (Week 1): Patient will complete sit <> supine with MaxA x1 PT Short Term Goal 2 (Week 1): Patient will transfer bed <> wc with LRAD and MaxA x1 PT Short Term Goal 3 (Week 1): Patient will ambulate 33f with LRAD, MaxA x1 and wc follow as needed  Skilled Therapeutic Interventions/Progress Updates:     1st session: Pt seen for unscheduled therapy. Presents supine in bed to start session, dtr at bedside. Pt agreeable to PT treatment - reports some buttock pain. Assisted in repositioning in bed at end of session for pressure relief, skin protection, and comfort. She's unintelligible at times - per recent  SLP note, ~70% intelligibility. Pt completed the following bed level exercises: -1x10 SLR, bilaterally -1x10 heel slides, bilaterally -1x10 hip abduction, bilaterally -1x20 ankle pumps, bilaterally *pt with some motor planning deficits during these exercises, especially while instructed to isolate muscle groups.  Required totalA for repositioning in bed, including scooting towards HOB. She was able to roll towards her R with minA and required maxA for rolling L. Multiple bouts completed to obtain comfortable semi-sidelying position. All needs met with bed alarm on at end of session.   2nd session: Pt supine in bed to start session with dtr at bedside. Pt agreeable to PT tx but requires convincing and encouragement. She reports unrated buttock pain, encouraged OOB mobility to offer pressure relief. Supine<>sit completed with very light minA with use of bed features - able to scoot towards EOB with light minA as well. Completes stand<>pivot transfer with maxA towards w/c with cues for forward weight shift and  sequencing. Requires modA for repositioning in chair and then transported to main rehab gym for time where she was wheeled in // bars. Completed x4 sit<>stand transfers in // bars with min/modA with with emphasis on forward lean to assist in natural movement pattern of standing. Once standing, she can stand with CGA with BUE support on // bars. Worked on standing tolerance for each stand where she stood for 60 seconds, 75 seconds, 75 seconds, and 80 seconds respectively. Also completed standing cone stacking with minA for balance, while using her RUE. Difficulty with reaching 2/2 shldr weakness and joint stiffness. Encouraged reaching outside BOS to challenge balance. Standing tolerance limited to BLE fatigue. Next, worked on functional gait training where she ambulated length of bars (817f twice with CGA/minA. Gait is very short shuffling steps with sometimes "sliding" her feet rather than lifting them, although she's able to lift with cues. Pt returned to her room where she requested to get back to bed. Assisted to bed via stand<>pivot transfer with maxA towards her L side. Required totalA for sit>supine for BLE and trunk management. Required totalA for repositioning in bed with semi-sidelying to offload buttock. Bed alarm on, all needs in reach, pt made comfortable with session concluded.   Therapy Documentation Precautions:  Precautions Precautions: Fall Precaution Comments: HOH, sacral PI, R hemi Restrictions Weight Bearing Restrictions: No General:    Therapy/Group: Individual Therapy  ChAlger Simons1/15/2022, 7:31 AM

## 2021-06-03 NOTE — Progress Notes (Signed)
Speech Language Pathology Daily Session Note  Patient Details  Name: Sherry [name] MRN: [id number] Date of Birth: 26-Jan-1920  Today's Date: 06/03/2021 SLP Individual Time: 7341-9379 SLP Individual Time Calculation (min): 44 min  Short Term Goals: Week 1: SLP Short Term Goal 1 (Week 1): Pt will consume current diet without overt s/sx of aspiration with min A verbal cues for use of swallowing compensatory strategies SLP Short Term Goal 2 (Week 1): Patient will utilize speech intelligiblity strategies at the word level with mod-to-max A verbal cues to achieve 75-90% intelligiblity SLP Short Term Goal 3 (Week 1): Pt will utilize call bell to request assistance with mod A verbal cues in 50% of observable opportunities SLP Short Term Goal 4 (Week 1): Pt will communicate functional needs to staff with mod-to-max A verbal cues SLP Short Term Goal 5 (Week 1): Pt will demonstrate awareness of speech errors during 50% of occasions with mod-to-max A verbal cues  Skilled Therapeutic Interventions: Pt seen for skilled ST with focus on speech and cognition goals. Pt continues to require encouragement to increase verbalizations with therapist, daughter states "I keep her talking". SLP providing pt with stimulus word and asked pt to create sentence with word, pt unable to complete this task despite max A cues. Pt reports she used to read the Bible often, provided with Book of Psalms and encouraged to read aloud independently. Daughter and pt appreciative of independent tasks to complete. Pt with some inconsistencies in reports of pain with SLP and MD. Pt initially reporting not feeling well to SLP, pain in bottom that daughter reports she has received tylenol for. When MD entered, pt with no complaints, pt was responsive to min-mod verbal verbal cues to increase communication of wants/needs/pain with MD. During unstructured conversation, pt intelligibility ~70% with pt demonstrating increased awareness of speech  errors and use of overarticulation to repair. Pt left in bed with daughter present and all needs within reach. Cont ST POC.  Pain Pain Assessment Pain Scale: 0-10 Pain Score: 0-No pain  Therapy/Group: Individual Therapy  Tacey Ruiz 06/03/2021, 9:35 AM

## 2021-06-04 MED ORDER — MAGNESIUM HYDROXIDE 400 MG/5ML PO SUSP: 15.0000 mL | Freq: Every day | ORAL | Status: DC

## 2021-06-04 MED ORDER — DICLOFENAC SODIUM 1 % EX GEL
2.0000 g | Freq: Four times a day (QID) | CUTANEOUS | Status: DC
  Administered 2021-06-05 – 2021-06-11 (×18): 2 g via TOPICAL
  Filled 2021-06-04: qty 100

## 2021-06-04 MED ORDER — MAGNESIUM HYDROXIDE 400 MG/5ML PO SUSP
30.0000 mL | Freq: Every day | ORAL | Status: DC
  Administered 2021-06-04 – 2021-06-10 (×7): 30 mL via ORAL
  Filled 2021-06-04 (×8): qty 30

## 2021-06-04 NOTE — Progress Notes (Addendum)
Patient ID: Sherry [name], female   DOB: [birth year], 87 y.o.   MRN: [id number] Met with pt, son ad daughter in-law to inform of team conference goals of mod-max level and discharge date 11/18. Son will let his sister's know of the plan and all in agreement with this plan. Pt is looking forward to go home and feels ready. Discussed will resume home health services and she has all needed equipment. Son reports she will transport her home and is used to doing this-wheelchair level and have a ramp at home.  2:20 PM Spoke with [name]-daughter who reports she-her Mom will need more care t discharge. She reports someone is always there but she wants more care from an agency for her. Will make PCS referral and [facility] will resume there services. Pt may be more appropriate for hospice services due to health issues and her age.   2:53 pm [name]-daughter called back pt has PCS 80 hours per month which covers M-F. She wanted weekend coverage told her family will need to do her care or hire assist. Want on CAPS list will place on waiting list, will do it but made aware of long wait for services.

## 2021-06-04 NOTE — Progress Notes (Signed)
PROGRESS NOTE   Subjective/Complaints: Looking much better today Has no new complaints Continues to have diffuse arthritis- discussed scheduling voltaren gel and her daughter is agreeable  ROS: +diffuse arthritic pain  Objective:   DG Chest 2 View  Result Date: 06/03/2021 CLINICAL DATA:  Shortness of breath, history of prior stroke EXAM: CHEST - 2 VIEW COMPARISON:  12/10/2020 FINDINGS: Cardiac shadow is enlarged but stable. Aortic calcifications and tortuosity are again seen and stable. The lungs are clear. No sizable effusion is noted. Degenerative changes of the shoulder joints are seen. Degenerative changes of the thoracic spine is noted as well. IMPRESSION: Chronic changes without acute abnormality. Electronically Signed   By: [name] M.D.   On: 06/03/2021 19:31   Recent Labs    06/02/21 0517  WBC 3.5*  HGB 11.6*  HCT 35.4*  PLT 318    Recent Labs    06/02/21 0517  NA 133*  K 4.3  CL 99  CO2 26  GLUCOSE 95  BUN 15  CREATININE 0.94  CALCIUM 9.2     Intake/Output Summary (Last 24 hours) at 06/04/2021 1207 Last data filed at 06/03/2021 1830 Gross per 24 hour  Intake 780 ml  Output --  Net 780 ml         Physical Exam: Vital Signs Blood pressure 138/81, pulse 68, temperature 97.8 F (36.6 C), temperature source Oral, resp. rate 16, height 5\' 1"  (1.549 m), weight 53.5 kg, SpO2 100 %. Constitutional: No distress . Vital signs reviewed. Wrapped up in her quilt working with SLP HEENT: NCAT, EOMI, oral membranes moist Neck: supple Cardiovascular: RRR without murmur. No JVD    Respiratory/Chest: CTA Bilaterally without wheezes or rales. Normal effort    GI/Abdomen: BS +, non-tender, non-distended Ext: no clubbing, cyanosis, or edema Psych: pleasant and cooperative, expresses gratitude Skin: Keratotic horn on left shin.   Neuro: HOH. Oriented to self. Able to state age as 65 and DOB as 1[birth year]. Follows  basic commands.  Right sided weakness noted --moves all 4's MSK: sitting at edge of bed with good postural balance   Assessment/Plan: 1. Functional deficits which require 3+ hours per day of interdisciplinary therapy in a comprehensive inpatient rehab setting. Physiatrist is providing close team supervision and 24 hour management of active medical problems listed below. Physiatrist and rehab team continue to assess barriers to discharge/monitor patient progress toward functional and medical goals  Care Tool:  Bathing  Bathing activity did not occur: Safety/medical concerns Body parts bathed by patient: Right arm, Chest, Abdomen, Right upper leg, Left upper leg, Face   Body parts bathed by helper: Buttocks, Front perineal area, Left lower leg, Right lower leg     Bathing assist Assist Level: 2 Helpers (sit to stand)     Upper Body Dressing/Undressing Upper body dressing   What is the patient wearing?: Pull over shirt    Upper body assist Assist Level: Total Assistance - Patient < 25%    Lower Body Dressing/Undressing Lower body dressing      What is the patient wearing?: Pants     Lower body assist Assist for lower body dressing: 2 Helpers     Toileting Toileting  Toileting assist Assist for toileting: 2 Helpers     Transfers Chair/bed transfer  Transfers assist     Chair/bed transfer assist level: 2 Helpers     Locomotion Ambulation   Ambulation assist      Assist level: 2 helpers Assistive device: Walker-rolling Max distance: 1   Walk 10 feet activity   Assist  Walk 10 feet activity did not occur: Safety/medical concerns        Walk 50 feet activity   Assist Walk 50 feet with 2 turns activity did not occur: Safety/medical concerns         Walk 150 feet activity   Assist Walk 150 feet activity did not occur: Safety/medical concerns         Walk 10 feet on uneven surface  activity   Assist Walk 10 feet on uneven surfaces  activity did not occur: Safety/medical concerns         Wheelchair     Assist Is the patient using a wheelchair?: Yes Type of Wheelchair: Manual    Wheelchair assist level: Total Assistance - Patient < 25% Max wheelchair distance: 150    Wheelchair 50 feet with 2 turns activity    Assist        Assist Level: Total Assistance - Patient < 25%   Wheelchair 150 feet activity     Assist      Assist Level: Total Assistance - Patient < 25%   Blood pressure 138/81, pulse 68, temperature 97.8 F (36.6 C), temperature source Oral, resp. rate 16, height 5\' 1"  (1.549 m), weight 53.5 kg, SpO2 100 %.  Medical Problem List and Plan: 1.  Left MCA CVA             -patient may shower             -ELOS/Goals: 2-3 weeks MinA  -Continue CIR therapies including PT, OT, and SLP   -B/C vitamin complex started for stroke recovery  -Interdisciplinary Team Conference today   2.  Impaired mobility: Continue Lovenox             -antiplatelet therapy: DAPT X 3 months-->to follow up with neurology for further input.  3. Diffuse arthritis: continue Tylenol #3 1/2 tab at bedtime daily. Scheduled voltaren gel 4 times per day             --tylenol prn  4. Mood: LCSW to follow for evaluation and support.              -antipsychotic agents: N/A 5. Neuropsych: This patient is not fully capable of making decisions on her own behalf. 6. Skin/Wound Care: Pressure relief measures.  --Turn every 2 hours while in bed.              --foam dressing for protection.  7. Fluids/Electrolytes/Nutrition: Monitor I/O.  8. A fib: Monitor HR TID--continue Cardizem 30 mg TID. Supplement IV magnesium for Mag <2 9. Chronic dysphagia: Will continue home diet of ensures with pureed food.   10. Persistent Hypokalemia: Will supplement today and tomorrow             --BMET monday. 11. Chronic constipation:  scheduled daily 83mL milk of magnesia 12. Hypertension: resolved with magnesium supplementation, continue  to monitor TID.  13. Suboptimal vitamin D: start on 1,00U daily supplement 14. Sacral pain: offload q2H to prevent pressure injury. Will try to get egg crate foam 15. Shortness of breath: EKG with aflutter. Resolved.  16. Disposition: messaged April to schedule hospital  follow-up with me.       LOS: 7 days A FACE TO FACE EVALUATION WAS PERFORMED  Sherry [name] 06/04/2021, 12:07 PM

## 2021-06-04 NOTE — Progress Notes (Signed)
Occupational Therapy Session Note  Patient Details  Name: Sherry [name] MRN: [id number] Date of Birth: 1920/06/23  Today's Date: 06/04/2021 OT Individual Time: 1300-1315 OT Individual Time Calculation (min): 15 min    Short Term Goals: Week 1:  OT Short Term Goal 1 (Week 1): Pt will self feed for 25% of meal with mod assist. OT Short Term Goal 2 (Week 1): Pt will complete UB dressing with mod assist to donn a pullover shirt. OT Short Term Goal 3 (Week 1): Pt will complete sit to stand for LB selfcare tasks with max assist. OT Short Term Goal 4 (Week 1): Pt will use the RUE at a gross assist level with min facilitation during selfcare tasks.  Skilled Therapeutic Interventions/Progress Updates:    First session: Pt semi reclined in supine in bed, c/o pain in buttocks "my bottom is sore".  Also reporting she feels too tired to get out of bed right now.  Total assist required to reposition pt in right sidelying and pillows placed for support and comfort.  Pt was able to assist in rolling to right somewhat using bed rail with max multimodal cues.  Son and daughter in law present, informed them therapist will re-attempt later in day.  Second session: Pt semi reclined in bed, requesting to get washed up; dtr present throughout session. Pt required total assist to bathe LB and doff/donn brief at bed level. Barrier cream applied over sacrum due to mild redness noted.  Pt then completed supine to sit with HOB elevated with mod assist and increased time.  Pt washed UB with mod assist with intermittent posterior lean needing mod assist to correct.  Pt donned shirt with max assist due to BUE stiffness.  Pt donned gown with max assist including buttons secured.  Sit to supine with total assist.  Repositioned towards [facility] with max assist and pt making effort to assist using bridging technique.  Call bell in reach, bed alarm on.  Therapy Documentation Precautions:  Precautions Precautions: Fall Precaution  Comments: HOH, sacral PI, R hemi Restrictions Weight Bearing Restrictions: No   Therapy/Group: Individual Therapy  Amie Critchley 06/04/2021, 6:36 PM

## 2021-06-04 NOTE — Progress Notes (Signed)
Speech Language Pathology Daily Session Note  Patient Details  Name: Sherry [name] MRN: [id number] Date of Birth: [birth year]  Today's Date: 06/04/2021 SLP Individual Time: 1002-1030 SLP Individual Time Calculation (min): 28 min  Short Term Goals: Week 1: SLP Short Term Goal 1 (Week 1): Pt will consume current diet without overt s/sx of aspiration with min A verbal cues for use of swallowing compensatory strategies SLP Short Term Goal 2 (Week 1): Patient will utilize speech intelligiblity strategies at the word level with mod-to-max A verbal cues to achieve 75-90% intelligiblity SLP Short Term Goal 3 (Week 1): Pt will utilize call bell to request assistance with mod A verbal cues in 50% of observable opportunities SLP Short Term Goal 4 (Week 1): Pt will communicate functional needs to staff with mod-to-max A verbal cues SLP Short Term Goal 5 (Week 1): Pt will demonstrate awareness of speech errors during 50% of occasions with mod-to-max A verbal cues  Skilled Therapeutic Interventions: Pt seen for skilled ST with focus on speech and swallowing goals, pt reporting pain in her buttocks that was distracting to pt during tx session. SLP providing repositioning in the bed to increase comfort throughout. When asked how to help patient or suggestions to increase comfort, pt would only state "I don't know". Pt does engage more with family, daughter returned to room at this point and was able to get patient to communicate wants/needs with improvement. Speech intelligibility a bit more clear this date, continues to benefit from min-mod A verbal cues at phrase/sentence level for overarticulation. Pt left in bed with daughter present for all needs. Cont ST POC.   Pain Pain Assessment Pain Scale: 0-10 Pain Score: 5  Pain Type: Acute pain Pain Location: Buttocks Pain Descriptors / Indicators: Aching Pain Intervention(s): Medication (See eMAR)  Therapy/Group: Individual Therapy  Tacey Ruiz 06/04/2021, 10:11 AM

## 2021-06-04 NOTE — Progress Notes (Signed)
Physical Therapy Session Note  Patient Details  Name: Sherry [name] MRN: [id number] Date of Birth: 03-Aug-1919  Today's Date: 06/04/2021 PT Individual Time: 5053-9767 PT Individual Time Calculation (min): 46 min   and  Today's Date: 06/04/2021 PT Missed Time: 14 Minutes Missed Time Reason: Patient fatigue  Short Term Goals: Week 1:  PT Short Term Goal 1 (Week 1): Patient will complete sit <> supine with MaxA x1 PT Short Term Goal 2 (Week 1): Patient will transfer bed <> wc with LRAD and MaxA x1 PT Short Term Goal 3 (Week 1): Patient will ambulate 44ft with LRAD, MaxA x1 and wc follow as needed  Skilled Therapeutic Interventions/Progress Updates:    Pt received supine in bed with her family present and pt agreeable to therapy session. Family had no specific questions/concerns regarding upcoming D/C and report pt has hired aid at home with plan for home health therapies as well. Therapist provided general education on importance of pressure relief every 30 minutes via either repositioning by rolling R/L in bed or coming to stand from her recliner with assistance - family verbalized understanding. Family confirms pt has hospital bed, wheelchair, and power recliner that assist her to coming to stand. Supine>sitting R EOB, HOB partially elevated and using bedrail, min assist for trunk upright but then heavy mod assist for scooting hips to EOB using bed pad. Pt reports need to use bathroom. Sit>stand partially elevated EOB>RW with max cuing to bring feet back under her BOS and lean forward with pt tending to place hands on RW to come to stand, which actually improves her anterior trunk lean therefore therapist allowed - mod assist for lifting to stand and balance. L stand pivot to [facility] with mod assist for balance due to posterior lean and for AD management when turning. Standing using B UE support on RW with heavy min assist for balance during total assist LB clothing management. Pt with poor eccentric  control lowering to sit on BSC lacking trunk/hip/knee flexion but instead leaning backwards. Pt continent of bowels and bladder. Sit>stand from BSC>RW with heavier mod assist for lifting to stand as pt's feet did not touch floor well and pt not able to scoot hips forward well - therapist had to completely block R foot as it would slide forward out from underneath her, which was made worse by pt's lack of anterior trunk lean - required 2nd attempt to come to standing safely. Standing with min/mod assist for balance due to posterior lean while +2 assist performed dependent peri-care and LB clothing management. R stand pivot back to EOB as described above but pt with more difficulty stepping R LE laterally during this transfer. Pt fatigued from toileting.and requesting to rest. Sit>supine with max cuing for sequencing and using bed features with mod assist for LE management. Pt left supine in bed with HOB elevated, needs in reach, bed alarm on, and family present. Missed 14 minutes of skilled physical therapy.  Therapy Documentation Precautions:  Precautions Precautions: Fall Precaution Comments: HOH, sacral PI, R hemi Restrictions Weight Bearing Restrictions: No   Pain:  Reports some R knee pain, unrated, and some swelling around the joint noticed - this did not limit pt's participation in therapy session.   Therapy/Group: Individual Therapy  Ginny Forth , PT, DPT, NCS, CSRS  06/04/2021, 3:15 PM

## 2021-06-04 NOTE — Progress Notes (Signed)
Physical Therapy Session Note  Patient Details  Name: Sherry [name] MRN: [id number] Date of Birth: [birth year]  Today's Date: 06/04/2021 PT Individual Time: 0900-0957 PT Individual Time Calculation (min): 57 min   Short Term Goals: Week 1:  PT Short Term Goal 1 (Week 1): Patient will complete sit <> supine with MaxA x1 PT Short Term Goal 2 (Week 1): Patient will transfer bed <> wc with LRAD and MaxA x1 PT Short Term Goal 3 (Week 1): Patient will ambulate 51ft with LRAD, MaxA x1 and wc follow as needed  Skilled Therapeutic Interventions/Progress Updates:     Pt seen supine in bed with dtr at bedside. Pt agreeable to PT treatment, reports buttock and general arthritic pains. Repositioning and mobility provided for pain management. Spoke at length with dtr regarding DC planning, home safety training, DME rec's, and f/u therapy care. Dtr and patient in agreement with sooner (rather than later) DC date as pt is near/at baseline mobility. MD arriving for morning rounding and collaborated with DC planning as well. Pt completed supine<>sit with HOB slightly elevated and minA for trunk support. Required modA for forward scooting to EOB with use of chuck pad for assist. She was able to maintain unsupported sitting balance with close supervision for >5 minutes while discussing DC planning with family and MD. With fatigue, she demonstrates posterior leaning in sitting. Completed sit<>stand to RW from EOB with modA and required min/modA for standing balance while completing stand<>pivot transfer to RW. Standing balance most impacted by her retrolean which she had difficulty correcting. Dtr reports that this is very close to how she was transferring at home. After transfer to w/c, noted to be incontinent of bowel and bladder. Assisted back to EOB in similar manner as above. Required totalA for sit>supine. Completed several bouts of rolling L/R in bed with minA towards her R side and maxA towards her L side.  Posterior pericare provided at dependant level and donned new clean brief as well. Repositioned in bed with totalA, including boosting up in bed and semi-sidelying to offload buttock. Pillows for floating heels and all needs in reach at conclusion of session. Bed alarm on.  Therapy Documentation Precautions:  Precautions Precautions: Fall Precaution Comments: HOH, sacral PI, R hemi Restrictions Weight Bearing Restrictions: No General:    Therapy/Group: Individual Therapy  Kamiya Acord P Aldridge Krzyzanowski PT 06/04/2021, 7:44 AM

## 2021-06-05 DIAGNOSIS — I63512 Cerebral infarction due to unspecified occlusion or stenosis of left middle cerebral artery: Secondary | ICD-10-CM | POA: Diagnosis not present

## 2021-06-05 LAB — CBC WITH DIFFERENTIAL/PLATELET
Abs Immature Granulocytes: 0.01 10*3/uL (ref 0.00–0.07)
Basophils Absolute: 0 10*3/uL (ref 0.0–0.1)
Basophils Relative: 1 %
Eosinophils Absolute: 0.1 10*3/uL (ref 0.0–0.5)
Eosinophils Relative: 3 %
HCT: 35.7 % — ABNORMAL LOW (ref 36.0–46.0)
Hemoglobin: 11.8 g/dL — ABNORMAL LOW (ref 12.0–15.0)
Immature Granulocytes: 0 %
Lymphocytes Relative: 36 %
Lymphs Abs: 1.4 10*3/uL (ref 0.7–4.0)
MCH: 27.6 pg (ref 26.0–34.0)
MCHC: 33.1 g/dL (ref 30.0–36.0)
MCV: 83.4 fL (ref 80.0–100.0)
Monocytes Absolute: 0.5 10*3/uL (ref 0.1–1.0)
Monocytes Relative: 14 %
Neutro Abs: 1.8 10*3/uL (ref 1.7–7.7)
Neutrophils Relative %: 46 %
Platelets: 367 10*3/uL (ref 150–400)
RBC: 4.28 MIL/uL (ref 3.87–5.11)
RDW: 17 % — ABNORMAL HIGH (ref 11.5–15.5)
WBC: 3.8 10*3/uL — ABNORMAL LOW (ref 4.0–10.5)
nRBC: 0 % (ref 0.0–0.2)

## 2021-06-05 NOTE — Progress Notes (Signed)
Occupational Therapy Session Note  Patient Details  Name: Sherry [name] MRN: [id number] Date of Birth: 06-08-1920  Today's Date: 06/05/2021 OT Individual Time: 9518-8416 OT Individual Time Calculation (min): 40 min  and Today's Date: 06/05/2021 OT Missed Time: 20 Minutes Missed Time Reason: Nursing care   Short Term Goals: Week 1:  OT Short Term Goal 1 (Week 1): Pt will self feed for 25% of meal with mod assist. OT Short Term Goal 2 (Week 1): Pt will complete UB dressing with mod assist to donn a pullover shirt. OT Short Term Goal 3 (Week 1): Pt will complete sit to stand for LB selfcare tasks with max assist. OT Short Term Goal 4 (Week 1): Pt will use the RUE at a gross assist level with min facilitation during selfcare tasks.   Skilled Therapeutic Interventions/Progress Updates:    Pt greeted at time of session semireclined in bed with family present who remained throughout session. Pt noted to have breakfast present in room but not eaten - wanting to eat first prior to mobility. Scoot up in bed with Max A, max multimodal cues for pt to process pushing through BLEs to push up in bed. HOB elevated and pt set up for breakfast. Note family feeding the pt, stating this is what was performed at home and it is their preference. When finished with breakfast, pt agreeable to get OOB and go to sink for ADL routine. Doffed SCDs and pt transitioned supine > sitting EOB with Max A, cues for sequencing throughout and extended time. Donned gown sitting EOB (did not have pants in room) with Max/total A. When preparing for transfer, nursing stating pt needed suppository and needed to be bed level for this activity. Pt returned to supine total/Max A and hand off to nursing. Missed 20 mins 2/2 nursing care.    Therapy Documentation Precautions:  Precautions Precautions: Fall Precaution Comments: HOH, sacral PI, R hemi Restrictions Weight Bearing Restrictions: No     Therapy/Group: Individual  Therapy  Erasmo Score 06/05/2021, 7:15 AM

## 2021-06-05 NOTE — Progress Notes (Addendum)
Patient ID: Sherry [name], female   DOB: [birth year], 87 y.o.   MRN: [id number] [name]-daughter-HCPOA called to express the family feels pt needs more rehab and to get more mobile before going home. Have messaged team and asked MD to call [facility]. Only option is going to a NH will address with her.  9;35 AM MD spoke with daughter-[name] who expressed concerns regarding Mom's care with just her one sister who is here. She is coming next week after getting released from MD in [place]. Plan now is to extend to [facility] 11/23 when [place] daughter will be here to assist her sister. Team and MD on board and will move discharge date to 11/23

## 2021-06-05 NOTE — Progress Notes (Shared)
Occupational Therapy Discharge Summary  Patient Details  Name: Sherry [name] MRN: [id number] Date of Birth: May 24, 1920    Patient has met {NUMBERS 0-12:18577} of {NUMBERS 0-12:18577} long term goals due to improved balance and postural control.  Patient to discharge at [facility] Max Assist level.  Patient's care partner is independent to provide the necessary physical and cognitive assistance at discharge.  The pt's progress has been limited 2/2 weakness, fatigue, and age related factors. Pt currently requires Mod/Max for stand pivot transfers with RW, dependent with self feeding (at PLOF), Max-total for LB ADLs and toileting, Mod/Max for UB ADLs. DC home with family to assist and has aide. Limited as well by cognition and aphasia.   Reasons goals not met: ***  Recommendation:  Patient will benefit from ongoing skilled OT services in home health setting to continue to advance functional skills in the area of BADL and Reduce care partner burden.  Equipment: No equipment provided  Reasons for discharge: discharge from hospital  Patient/family agrees with progress made and goals achieved: Yes  OT Discharge Precautions/Restrictions  Precautions Precautions: Fall Precaution Comments: Hard of hearing, sacral pressure injury, R hemi, arthritic Restrictions Weight Bearing Restrictions: No Vital Signs Therapy Vitals Temp: 97.8 F (36.6 C) Temp Source: Oral Pulse Rate: 75 Resp: 18 BP: 133/61 Patient Position (if appropriate): Lying Oxygen Therapy SpO2: 99 % O2 Device: Room Air Pain Pain Assessment Pain Scale: 0-10 Pain Score: 0-No pain ADL ADL Eating: Dependent Where Assessed-Eating: Bed level Grooming: Maximal assistance (per most recent staff report) Where Assessed-Grooming: Edge of bed Upper Body Bathing: Maximal assistance (per most recent staff report) Where Assessed-Upper Body Bathing: Edge of bed Lower Body Bathing: Dependent (per most recent staff report) Where  Assessed-Lower Body Bathing: Edge of bed Upper Body Dressing: Maximal assistance (per most recent staff report) Where Assessed-Upper Body Dressing: Edge of bed Lower Body Dressing: Dependent (per most recent staff report) Where Assessed-Lower Body Dressing: Edge of bed Toileting: Dependent (per most recent staff report) Where Assessed-Toileting: Bedside Commode Toilet Transfer: Moderate assistance (per most recent staff report) Toilet Transfer Method: Stand pivot Toilet Transfer Equipment: Radiographer, therapeutic: Not assessed Social research officer, government: Not assessed Vision Baseline Vision/History: 0 No visual deficits Patient Visual Report: No change from baseline Vision Assessment?: No apparent visual deficits Eye Alignment: Within Functional Limits Ocular Range of Motion: Within Functional Limits Visual Fields: No apparent deficits Perception  Perception: Impaired Inattention/Neglect: Impaired-to be further tested in functional context Praxis Praxis: Impaired Praxis Impairment Details: Motor planning;Initiation Cognition Overall Cognitive Status: History of cognitive impairments - at baseline Arousal/Alertness: Awake/alert Orientation Level: Oriented to situation;Disoriented to time;Disoriented to situation;Disoriented to place Year: 2022 Month: November Day of Week: Incorrect Attention: Focused;Sustained Focused Attention: Appears intact Sustained Attention: Impaired Sustained Attention Impairment: Functional basic;Verbal basic Memory: Impaired Memory Impairment: Storage deficit;Retrieval deficit Awareness: Impaired Awareness Impairment: Emergent impairment Problem Solving: Impaired Problem Solving Impairment: Functional basic;Verbal basic Safety/Judgment: Impaired Sensation Sensation Light Touch: Appears Intact Hot/Cold: Appears Intact Proprioception: Not tested Stereognosis: Not tested Coordination Gross Motor Movements are Fluid and Coordinated:  No Fine Motor Movements are Fluid and Coordinated: No Coordination and Movement Description: Impacted by global deconditioning/weakness as well as significant general arthritis in all major joints Heel Shin Test: limited AROM B Motor  Motor Motor: Hemiplegia;Abnormal postural alignment and control Motor - Discharge Observations: generalized weakness, R hemi, posterior lean in sitting/standing Mobility  Bed Mobility Bed Mobility: Rolling Right;Supine to Sit;Sit to Supine;Rolling Left;Scooting to [facility] Rolling Right: Minimal Assistance -  Patient > 75% Rolling Left: Total Assistance - Patient < 25% Supine to Sit: Minimal Assistance - Patient > 75% Sit to Supine: Total Assistance - Patient < 25% Scooting to HOB: Dependent - Patient equal 0% Transfers Sit to Stand: Moderate Assistance - Patient 50-74%;Maximal Assistance - Patient 25-49% (per most recent staff report) Stand to Sit: Moderate Assistance - Patient 50-74%;Maximal Assistance - Patient 25-49% (per most recent staff report)  Trunk/Postural Assessment  Cervical Assessment Cervical Assessment: Exceptions to [facility] Thoracic Assessment Thoracic Assessment: Exceptions to [facility] Lumbar Assessment Lumbar Assessment: Exceptions to [facility] Postural Control Postural Control: Deficits on evaluation Righting Reactions: delayed and inadequate Protective Responses: delayed and inadequate Postural Limitations: strong posterior bias  Balance Balance Balance Assessed: Yes Static Sitting Balance Static Sitting - Balance Support: Feet supported Static Sitting - Level of Assistance: 4: Min assist Dynamic Sitting Balance Dynamic Sitting - Balance Support: During functional activity Dynamic Sitting - Level of Assistance: 2: Max assist Static Standing Balance Static Standing - Balance Support: During functional activity;Bilateral upper extremity supported Static Standing - Level of Assistance: 3: Mod assist Dynamic Standing Balance Dynamic Standing -  Balance Support: Bilateral upper extremity supported;During functional activity Dynamic Standing - Level of Assistance: 2: Max assist (per staff) Extremity/Trunk Assessment RUE Assessment RUE Assessment: Exceptions to [facility] LUE Assessment LUE Assessment: Exceptions to [facility] 06/05/2021, 8:27 AM

## 2021-06-05 NOTE — Discharge Summary (Signed)
Physical Therapy Discharge Summary  Patient Details  Name: Sherry [name] MRN: [id number] Date of Birth: 1920/07/08  Patient has met 6 of 6 long term goals due to improved activity tolerance, improved postural control, increased strength, and functional use of  right lower extremity and left lower extremity.  Patient to discharge at a wheelchair level [place].   Patient's care partner is independent to provide the necessary physical assistance at discharge. Family training has been completed to provide appropriate assist level. Family also has hired Tax adviser to aid in support.  Reasons goals not met: n/a  Recommendation:  Patient will benefit from ongoing skilled PT services in home health setting to continue to advance safe functional mobility, address ongoing impairments in global weakness and deconditioning, functional mobility, sitting and standing balance to minimize fall risk and reduce caregiver burden.  Equipment: No equipment provided. Pt owns hospital bed, BSC, w/c, and chair lift recliner  Reasons for discharge: treatment goals met and discharge from hospital  Patient/family agrees with progress made and goals achieved: Yes  PT Discharge Precautions/Restrictions Precautions Precautions: Fall;Other (comment) Precaution Comments: Hard of hearing, sacral pressure injury, R hemi, arthritic Restrictions Weight Bearing Restrictions: No Pain Pain Assessment Pain Scale: 0-10 Pain Score: 2  Pain Type: Acute pain;Chronic pain Pain Location: Sacrum Pain Descriptors / Indicators: Aching Pain Onset: Gradual Pain Intervention(s): Ambulation/increased activity;Repositioned;Emotional support Pain Interference Pain Interference Pain Effect on Sleep: 2. Occasionally Pain Interference with Therapy Activities: 2. Occasionally Pain Interference with Day-to-Day Activities: 2. Occasionally Vision/Perception  Vision - History Ability to See in Adequate Light: 0  Adequate Vision - Assessment Eye Alignment: Within Functional Limits Ocular Range of Motion: Within Functional Limits Perception Perception: Impaired Inattention/Neglect: Impaired-to be further tested in functional context Praxis Praxis: Impaired Praxis Impairment Details: Motor planning;Initiation  Cognition Overall Cognitive Status: History of cognitive impairments - at baseline Arousal/Alertness: Awake/alert Orientation Level: Oriented to place;Disoriented to place;Oriented to situation;Disoriented to time Attention: Focused;Sustained Focused Attention: Appears intact Sustained Attention: Impaired Sustained Attention Impairment: Functional basic;Verbal basic Memory: Impaired Memory Impairment: Storage deficit;Retrieval deficit Awareness: Impaired Awareness Impairment: Emergent impairment Problem Solving: Impaired Problem Solving Impairment: Functional basic;Verbal basic Safety/Judgment: Impaired Sensation Sensation Light Touch: Appears Intact Hot/Cold: Appears Intact Proprioception: Not tested Stereognosis: Not tested Coordination Gross Motor Movements are Fluid and Coordinated: No Fine Motor Movements are Fluid and Coordinated: No Coordination and Movement Description: Impacted by global deconditioning/weakness as well as significant general arthritis in all major joints Heel Shin Test: limited AROM B Motor  Motor Motor: Hemiplegia;Abnormal postural alignment and control Motor - Discharge Observations: generalized weakness, R hemi, posterior lean in sitting/standing  Mobility Bed Mobility Bed Mobility: Rolling Right;Supine to Sit;Sit to Supine;Rolling Left;Scooting to [facility] Rolling Right: Minimal Assistance - Patient > 75% Rolling Left: Total Assistance - Patient < 25% Supine to Sit: Minimal Assistance - Patient > 75% Sit to Supine: Total Assistance - Patient < 25% Scooting to HOB: Dependent - Patient equal 0% Transfers Transfers: Sit to Stand;Stand to Sit;Stand  Pivot Transfers Sit to Stand: Moderate Assistance - Patient 50-74%;Maximal Assistance - Patient 25-49% Stand to Sit: Moderate Assistance - Patient 50-74%;Maximal Assistance - Patient 25-49% Stand Pivot Transfers: Moderate Assistance - Patient 50 - 74%;Maximal Assistance - Patient 25 - 49% (fluctuates b/w modA to maxA) Stand Pivot Transfer Details: Tactile cues for posture;Tactile cues for weight shifting;Tactile cues for initiation;Visual cues for safe use of DME/AE;Visual cues/gestures for precautions/safety;Visual cues/gestures for sequencing;Verbal cues for sequencing;Verbal cues for precautions/safety;Verbal cues for technique;Verbal cues for gait  pattern;Verbal cues for safe use of DME/AE;Manual facilitation for weight shifting;Manual facilitation for placement;Tactile cues for weight beaing Transfer (Assistive device): Rolling walker Locomotion  Gait Ambulation: Yes Gait Assistance: Minimal Assistance - Patient > 75% Gait Distance (Feet): 25 Feet Assistive device: Rolling walker Gait Assistance Details: Manual facilitation for placement;Manual facilitation for weight shifting;Manual facilitation for weight bearing;Verbal cues for safe use of DME/AE;Verbal cues for precautions/safety;Verbal cues for gait pattern;Tactile cues for placement;Tactile cues for weight beaing;Tactile cues for sequencing;Tactile cues for initiation;Tactile cues for weight shifting Gait Gait: Yes Gait Pattern: Impaired Gait Pattern: Step-to pattern;Decreased step length - left;Decreased step length - right;Shuffle;Trunk flexed;Narrow base of support Gait velocity: significantly decreased Stairs / Additional Locomotion Stairs: No Wheelchair Mobility Wheelchair Mobility: Yes Wheelchair Assistance: Dependent - Patient 0% Wheelchair Propulsion: Both upper extremities Wheelchair Parts Management: Needs assistance Distance: 150'  Trunk/Postural Assessment  Cervical Assessment Cervical Assessment: Exceptions to [facility]  (forward head) Thoracic Assessment Thoracic Assessment: Exceptions to [facility] (age related kyphosis and rounded shoulders) Lumbar Assessment Lumbar Assessment: Exceptions to [facility] (ridgid posterior pelvic tilt in sitting) Postural Control Postural Control: Deficits on evaluation Righting Reactions: delayed and inadequate Protective Responses: delayed and inadequate Postural Limitations: strong posterior bias  Balance Balance Balance Assessed: Yes Static Sitting Balance Static Sitting - Balance Support: Feet supported Static Sitting - Level of Assistance: 4: Min assist Dynamic Sitting Balance Dynamic Sitting - Balance Support: During functional activity Dynamic Sitting - Level of Assistance: 2: Max assist Static Standing Balance Static Standing - Balance Support: During functional activity;Bilateral upper extremity supported Static Standing - Level of Assistance: 3: Mod assist Dynamic Standing Balance Dynamic Standing - Balance Support: Bilateral upper extremity supported;During functional activity Dynamic Standing - Level of Assistance: 2: Max assist Extremity Assessment      RLE Assessment RLE Assessment: Exceptions to [facility] General Strength Comments: Grossly 3+/5 LLE Assessment LLE Assessment: Exceptions to [facility] General Strength Comments: Grossly 3+/5   Addi Pak P Donnisha Besecker PT 06/05/2021, 7:41 AM

## 2021-06-05 NOTE — Progress Notes (Signed)
Physical Therapy Session Note  Patient Details  Name: Sherry [name] MRN: [id number] Date of Birth: August 18, 1919  Today's Date: 06/05/2021 PT Individual Time: 1030-1053 PT Individual Time Calculation (min): 23 min   Short Term Goals: Week 1:  PT Short Term Goal 1 (Week 1): Patient will complete sit <> supine with MaxA x1 PT Short Term Goal 2 (Week 1): Patient will transfer bed <> wc with LRAD and MaxA x1 PT Short Term Goal 3 (Week 1): Patient will ambulate 66ft with LRAD, MaxA x1 and wc follow as needed  Skilled Therapeutic Interventions/Progress Updates:   Received pt sitting in [facility] with daughter present at bedside. Per pt/daughter, pt now discharging on Wednesday - see CSW note. Pt reported fatigue from previous therapies and requested to return to bed. Session with emphasis on functional mobility/transfers, generalized strengthening, and standing balance/coordination. Pt transferred sit<>stand with RW and mod/max A from low WC pulling up on RW with BUE - verbal/tactile cues to facilitate anterior weight shifting due to significant retropulsion and R lateral lean. Stand<>pivot WC<>bed to L with RW and mod A with manual facilitation to steer RW. Sit<>supine with max A for LE management and required max A for repositioning in bed. Therapist assisted in getting pt in comfortable position in bed and placed pillows underneath heels for pressure relief. Concluded session with pt semi-reclined in bed falling asleep, needs within reach, and bed alarm on.  Therapy Documentation Precautions:  Precautions Precautions: Fall Precaution Comments: HOH, sacral PI, R hemi Restrictions Weight Bearing Restrictions: No  Therapy/Group: Individual Therapy Martin Majestic PT, DPT   06/05/2021, 7:17 AM

## 2021-06-05 NOTE — Progress Notes (Addendum)
Inpatient Rehabilitation Care Coordinator Discharge Note   Patient Details  Name: NERI VIEYRA MRN: [id number] Date of Birth: [birth year]   Discharge location: HOME WITH FAMILY PROVIDING 24/7 CARE  Length of Stay:  14 DAYS  Discharge activity level: MOD/MAX LEVEL  Home/community participation: ACTIVE  Patient response DU:KGURKY Literacy - How often do you need to have someone help you when you read instructions, pamphlets, or other written material from your doctor or pharmacy?: Always  Patient response HC:WCBJSE Isolation - How often do you feel lonely or isolated from those around you?: Never  Services provided included: MD, RD, PT, OT, SLP, RN, CM, Pharmacy, SW  Financial Services:  Financial Services Utilized: Medicare    Choices offered to/list presented to: DAUGHTER'S  Follow-up services arranged:  Home Health, Other (Comment) Home Health Agency: [facility]-PT,SP,RN PCS 80 HOURS PER MONTH RESUME  HAS ALL DME FROM PREVIOUS ADMISSIONS HAS PRIVATE DUTY LIST TO HIRE NIGHT TIME CARE. HAS PCS SERVICES ALREADY        Patient response to transportation need: Is the patient able to respond to transportation needs?: Yes In the past 12 months, has lack of transportation kept you from medical appointments or from getting medications?: No In the past 12 months, has lack of transportation kept you from meetings, work, or from getting things needed for daily living?: No    Comments (or additional information): FAMILY WANTS MORE ASSISTANCE AT HOME, HAVE PLACED ON CAP WAITING LIST. ALREADY RECEIVES PCS AND HOME HEALTH SERVICES. HAS ALL NEEDED DME. FAMILY MEMBER HERE DAILY AND PARTICIPATES IN THERAPIES WITH MOM. DAUGHTER ASKED AGAIN ABOUT 24/7 CARE. PT DOES RECEIVE PCS 4 HR 5 DAYS PER WEEK AND IS ON THE CAP WAITING LIST WHICH IS ONE YEAR LONG. DISCUSSED AGAIN ALL OF THE FAMILY POOLING TIER MONEY AND HIRING ASSIST AT NIGHT. THERE ARE 6 GENERATIONS OF FAMILY MEMBERS. 101  GRANDCHILDREN ALONE. OFFERED AGAIN NHP AND THIS WAS DECLINED.  Patient/Family verbalized understanding of follow-up arrangements:  Yes  Individual responsible for coordination of the follow-up plan: [name]-DAUGHTER [phone]  Confirmed correct DME delivered: Lucy Chris 06/05/2021    Fujiko Picazo, Lemar Livings

## 2021-06-05 NOTE — Progress Notes (Signed)
PROGRESS NOTE   Subjective/Complaints: Feeling better today Discussed with daughter Sherry [name] her concerns regarding d/c tomorrow, have extended until Wednesday as that is when family support will be available to patient.   ROS: +diffuse arthritic pain, +chronic constipation  Objective:   DG Chest 2 View  Result Date: 06/03/2021 CLINICAL DATA:  Shortness of breath, history of prior stroke EXAM: CHEST - 2 VIEW COMPARISON:  12/10/2020 FINDINGS: Cardiac shadow is enlarged but stable. Aortic calcifications and tortuosity are again seen and stable. The lungs are clear. No sizable effusion is noted. Degenerative changes of the shoulder joints are seen. Degenerative changes of the thoracic spine is noted as well. IMPRESSION: Chronic changes without acute abnormality. Electronically Signed   By: Alcide Clever M.D.   On: 06/03/2021 19:31   Recent Labs    06/05/21 0500  WBC 3.8*  HGB 11.8*  HCT 35.7*  PLT 367    No results for input(s): NA, K, CL, CO2, GLUCOSE, BUN, CREATININE, CALCIUM in the last 72 hours.    Intake/Output Summary (Last 24 hours) at 06/05/2021 1126 Last data filed at 06/05/2021 0915 Gross per 24 hour  Intake 560 ml  Output --  Net 560 ml         Physical Exam: Vital Signs Blood pressure 133/61, pulse 75, temperature 97.8 F (36.6 C), temperature source Oral, resp. rate 18, height 5\' 1"  (1.549 m), weight 53.5 kg, SpO2 99 %. Constitutional: No distress . Vital signs reviewed. Wrapped up in her quilt working with SLP HEENT: NCAT, EOMI, oral membranes moist Neck: supple Cardiovascular: RRR without murmur. No JVD    Respiratory/Chest: CTA Bilaterally without wheezes or rales. Normal effort    GI/Abdomen: BS +, non-tender, non-distended Ext: no clubbing, cyanosis, or edema Psych: pleasant and cooperative, expresses gratitude Skin: Keratotic horn on left shin.   Neuro: HOH. Oriented to self. Able to state age as  42 and DOB as 1[birth year]1. Follows basic commands.  Right sided weakness noted --moves all 4's MSK: can sit at edge of bed with good postural balance. MaxA for repositioning in bed   Assessment/Plan: 1. Functional deficits which require 3+ hours per day of interdisciplinary therapy in a comprehensive inpatient rehab setting. Physiatrist is providing close team supervision and 24 hour management of active medical problems listed below. Physiatrist and rehab team continue to assess barriers to discharge/monitor patient progress toward functional and medical goals  Care Tool:  Bathing  Bathing activity did not occur: Safety/medical concerns Body parts bathed by patient: Right arm, Chest, Abdomen, Right upper leg, Left upper leg, Face (per most recent staff report)   Body parts bathed by helper: Buttocks, Front perineal area, Left lower leg, Right lower leg (per most recent staff report)     Bathing assist Assist Level: 2 Helpers (per most recent staff report)     Upper Body Dressing/Undressing Upper body dressing   What is the patient wearing?: Pull over shirt    Upper body assist Assist Level: Maximal Assistance - Patient 25 - 49% (per most recent staff report)    Lower Body Dressing/Undressing Lower body dressing      What is the patient wearing?: Pants  Lower body assist Assist for lower body dressing: 2 Helpers (per most recent staff report)     Toileting Toileting    Toileting assist Assist for toileting: 2 Helpers (per most recent staff report)     Transfers Chair/bed transfer  Transfers assist     Chair/bed transfer assist level: Moderate Assistance - Patient 50 - 74% Chair/bed transfer assistive device: Museum/gallery exhibitions officer assist      Assist level: 2 helpers Assistive device: Walker-rolling Max distance: 1   Walk 10 feet activity   Assist  Walk 10 feet activity did not occur: Safety/medical concerns        Walk 50 feet  activity   Assist Walk 50 feet with 2 turns activity did not occur: Safety/medical concerns         Walk 150 feet activity   Assist Walk 150 feet activity did not occur: Safety/medical concerns         Walk 10 feet on uneven surface  activity   Assist Walk 10 feet on uneven surfaces activity did not occur: Safety/medical concerns         Wheelchair     Assist Is the patient using a wheelchair?: Yes Type of Wheelchair: Manual    Wheelchair assist level: Total Assistance - Patient < 25% Max wheelchair distance: 150    Wheelchair 50 feet with 2 turns activity    Assist        Assist Level: Total Assistance - Patient < 25%   Wheelchair 150 feet activity     Assist      Assist Level: Total Assistance - Patient < 25%   Blood pressure 133/61, pulse 75, temperature 97.8 F (36.6 C), temperature source Oral, resp. rate 18, height 5\' 1"  (1.549 m), weight 53.5 kg, SpO2 99 %.  Medical Problem List and Plan: 1.  Left MCA CVA             -patient may shower             -ELOS/Goals: 2-3 weeks MinA  -Continue CIR therapies including PT, OT, and SLP   -B/C vitamin complex started for stroke recovery  Updated daughters 11/17 2.  Impaired mobility: Continue Lovenox             -antiplatelet therapy: DAPT X 3 months-->to follow up with neurology for further input.  3. Diffuse arthritis: continue Tylenol #3 1/2 tab at bedtime daily. Scheduled voltaren gel 4 times per day             --tylenol prn  4. Mood: LCSW to follow for evaluation and support.              -antipsychotic agents: N/A 5. Neuropsych: This patient is not fully capable of making decisions on her own behalf. 6. Skin/Wound Care: Pressure relief measures.  --Turn every 2 hours while in bed.              --foam dressing for protection.  7. Fluids/Electrolytes/Nutrition: Monitor I/O.  8. A fib: Monitor HR TID--continue Cardizem 30 mg TID. Supplement IV magnesium for Mag <2 9. Chronic  dysphagia: Will continue home diet of ensures with pureed food.   10. Persistent Hypokalemia: Will supplement today and tomorrow             --BMET monday. 11. Chronic constipation:  scheduled daily 28mL milk of magnesia. Discussed with family and patient 51. Hypertension: resolved with magnesium supplementation, continue to monitor TID.  13. Suboptimal  vitamin D: start on 1,00U daily supplement 14. Sacral pain: offload q2H to prevent pressure injury. Will try to get egg crate foam 15. Shortness of breath: EKG with aflutter. Resolved.  16. Disposition: messaged April to schedule hospital follow-up with me.       LOS: 8 days A FACE TO FACE EVALUATION WAS PERFORMED  Sherry [name] P Maverick Patman 06/05/2021, 11:26 AM

## 2021-06-05 NOTE — Patient Care Conference (Signed)
Inpatient RehabilitationTeam Conference and Plan of Care Update Date: 06/05/2021   Time: 11:11 AM    Patient Name: Sherry [name]      Medical Record Number: [id number]  Date of Birth: [birth year] Sex: Female         Room/Bed: 4W19C/4W19C-01 Payor Info: Payor: MEDICARE / Plan: MEDICARE PART A AND B / Product Type: *No Product type* /    Admit Date/Time:  05/28/2021  3:26 PM  Primary Diagnosis:  Acute ischemic left middle cerebral artery (MCA) stroke [facility])  Hospital Problems: Principal Problem:   Acute ischemic left middle cerebral artery (MCA) stroke [facility])    Expected Discharge Date: Expected Discharge Date: 06/06/21  Team Members Present: Physician leading conference: Dr. Leeroy Cha Social Worker Present: Ovidio Kin, LCSW Nurse Present: Dorien Chihuahua, RN PT Present: Ginnie Smart, PT OT Present: Leretha Pol, OT SLP Present: Lillie Columbia, SLP PPS Coordinator present : Ileana Ladd, PT     Current Status/Progress Goal Weekly Team Focus  Bowel/Bladder   incontinent of stool and bladder; BM 11/14  regain continence  admin stool softners as ordered; offer toileting; assess qshift and prn   Swallow/Nutrition/ Hydration   D1/thin with SPV  D1/thin SPV  signing off on swallow goals   ADL's   total- max assist self care, total assist and at times +2 functional transfers  mod assist- need to downgrade  functional transfers, balance, endurance, self care training   Mobility   mod to maxA for bed mobility with hospital features (pt owns a hospital bed), maxA for sit<>stand and stand<>pivot transfers, gait in // bars ~17ft with minA  modA overall. Gait goal set to 77ft  bed mobility, functional transfers, gait training as able, DC planning   Communication   Mod A for speech intelligibility strategies at word level  Mod A at phrase level  intelligibility strategies and family ed   Safety/Cognition/ Behavioral Observations  emergent awareness max A  mod A  increase  awareness and use of call button   Pain   c/o pain 6/10 BLE's; cramping  pain < 3  assess qshift and prn   Skin   skin intact; sacral foam dressing for prevention  skin remains intact  assess qshift and prn     Discharge Planning:  Home with daughter and family members who provide 24/7 care prior to admission. Daughter stays the night with her, would like her stronger before going home   Team Discussion: Patient with arthritis pain and constipation. Acute onset of shortness of breath; EKG ordered per MD, suspect muscular pain.  Constipation addressed; hx of constipation issues - colostomy and patient is incontinent of bladder. PRNs for pain ineffective; MD adjusted meds. Discolored area on sacrum healing.   Patient on target to meet rehab goals: Progress limited by pain and debility. Currently total assist - max assist overall. Goals for discharge set for Mod assist.   *See Care Plan and progress notes for long and short-term goals.   Revisions to Treatment Plan:  Downgraded goals   Teaching Needs: Safety, medication management, secondary risk management, transfers, etc  Current Barriers to Discharge: None  Possible Resolutions to Barriers: Family education w son and family HH follow up services continued as scheduled PTA     Medical Summary Current Status: diffuse arthritis, severe constipation, shortness of breath, impaired endurance,  hypomagnesemia, dysphagia  Barriers to Discharge: Medical stability  Barriers to Discharge Comments: diffuse arthritis, severe constipation, shortness of breath, impaired endurance, hypomagnesemia, dysphagia Possible Resolutions to  Barriers/Weekly Focus: start voltaren gel, change constipation medications to milk of magnesia daily, EKG obtained to assess shortness of breath, continue dysphagia 1 diet.   Continued Need for Acute Rehabilitation Level of Care: The patient requires daily medical management by a physician with specialized training  in physical medicine and rehabilitation for the following reasons: Direction of a multidisciplinary physical rehabilitation program to maximize functional independence : Yes Medical management of patient stability for increased activity during participation in an intensive rehabilitation regime.: Yes Analysis of laboratory values and/or radiology reports with any subsequent need for medication adjustment and/or medical intervention. : Yes   I attest that I was present, lead the team conference, and concur with the assessment and plan of the team.   Chana Bode B 06/05/2021, 8:26 AM

## 2021-06-05 NOTE — Progress Notes (Signed)
Physical Therapy Session Note  Patient Details  Name: Sherry [name] MRN: [id number] Date of Birth: 05/13/1920  Today's Date: 06/05/2021 PT Individual Time: 0900-0955 PT Individual Time Calculation (min): 55 min   Short Term Goals: Week 1:  PT Short Term Goal 1 (Week 1): Patient will complete sit <> supine with MaxA x1 PT Short Term Goal 2 (Week 1): Patient will transfer bed <> wc with LRAD and MaxA x1 PT Short Term Goal 3 (Week 1): Patient will ambulate 12ft with LRAD, MaxA x1 and wc follow as needed  Skilled Therapeutic Interventions/Progress Updates:     Pt greeted supine in bed with dtr at bedside. Pt agreeable to PT tx. No reports of pain. Dtr and pt report readiness for discharge tomorrow. However, other dtr reporting hesitancy regarding upcoming DC and is requesting a later date. Collobarted with CSW, MD, and therapy team. Pt noted to be incontinet of bladder, completed brief change, pericare at bed level with dependant assist. Donned pants at bed level as well, dependant assist. Performed several bouts of rolling during this tasks, requiring minA for rolling L and modA for rolling R with use of bed rails.   Supine<>sit completed at [facility] level with HOB elevated, required modA for forward scooting to EOB. Completed squat<>pivot transfer with maxA from EOB to w/c, towards her R side. Pt then transported to main rehab gym for time and energy conservation. Completed active warm up there-ex while seated in w/c, 2x10 of LAQ with 3# ankle weights.   Pt then instructed in functional gait training in rehab gym. Required modA for sit<>stand to RW from low w/c height, required R knee blocked as it slides forward as she tries to stand. She ambulated 24ft with minA and RW with +2 assist for w/c follow for safety. Primary gait deficits include short shuffling steps, R lateral lean, and forward flexed trunk.   Pt transported back to her room where she remained seated in w/c with all needs in reach, dtr  at bedside. Pt informed of upcoming therapy schedule.   Therapy Documentation Precautions:  Precautions Precautions: Fall Precaution Comments: Hard of hearing, sacral pressure injury, R hemi, arthritic Restrictions Weight Bearing Restrictions: No General:    Therapy/Group: Individual Therapy  Jaqulyn Chancellor P Virgene Tirone PT 06/05/2021, 9:28 AM

## 2021-06-05 NOTE — Progress Notes (Signed)
Speech Language Pathology Daily Session Note  Patient Details  Name: Sherry [name] MRN: [id number] Date of Birth: 06/10/1920  Today's Date: 06/05/2021 SLP Individual Time: 1450-1530 SLP Individual Time Calculation (min): 40 min  Short Term Goals: Week 1: SLP Short Term Goal 1 (Week 1): Pt will consume current diet without overt s/sx of aspiration with min A verbal cues for use of swallowing compensatory strategies SLP Short Term Goal 2 (Week 1): Patient will utilize speech intelligiblity strategies at the word level with mod-to-max A verbal cues to achieve 75-90% intelligiblity SLP Short Term Goal 3 (Week 1): Pt will utilize call bell to request assistance with mod A verbal cues in 50% of observable opportunities SLP Short Term Goal 4 (Week 1): Pt will communicate functional needs to staff with mod-to-max A verbal cues SLP Short Term Goal 5 (Week 1): Pt will demonstrate awareness of speech errors during 50% of occasions with mod-to-max A verbal cues  Skilled Therapeutic Interventions:  Patient seen for skilled ST session focusing on speech goals. Patient in bed, awake and alert when SLP entered room. She reported that her daughter had been here earlier but "she went to the cafeteria and never came back". Patient would periodically mention this, saying that the coat and shoes on chair by window belonged to her daughter. Patient was oriented to month and able to recall and describe at basic level, some therapy exercises completed today. She required overall modA verbal cues to maintain adequate speech intelligibility. Patient did exhibit instances of dysfluencies in form of blocks/abrupt halting in speech. She denied history of any speech, voice, fluency issues prior to this stroke. Patient completed oral reading aloud of bible passage and intelligibility improved when doing so. She continues to benefit from skilled SLP intervention prior to discharge home, however is approaching goal level.    Pain Pain Assessment Pain Scale: Faces Faces Pain Scale: Hurts a little bit Pain Type: Acute pain Pain Location: Buttocks Pain Descriptors / Indicators: Aching;Discomfort Pain Onset: Gradual Pain Intervention(s): Repositioned  Therapy/Group: Individual Therapy  Angela Nevin, MA, CCC-SLP Speech Therapy

## 2021-06-06 DIAGNOSIS — I63512 Cerebral infarction due to unspecified occlusion or stenosis of left middle cerebral artery: Secondary | ICD-10-CM | POA: Diagnosis not present

## 2021-06-06 NOTE — Progress Notes (Signed)
PROGRESS NOTE   Subjective/Complaints: Continues to feel better Daughter at bedside says she wants to go home, but she understands extension until Wednesday so that her other daughter and brothers can assist her at home  ROS: +diffuse arthritic pain, +chronic constipation, improved appetite  Objective:   No results found. Recent Labs    06/05/21 0500  WBC 3.8*  HGB 11.8*  HCT 35.7*  PLT 367    No results for input(s): NA, K, CL, CO2, GLUCOSE, BUN, CREATININE, CALCIUM in the last 72 hours.    Intake/Output Summary (Last 24 hours) at 06/06/2021 1522 Last data filed at 06/06/2021 0816 Gross per 24 hour  Intake 240 ml  Output --  Net 240 ml         Physical Exam: Vital Signs Blood pressure 114/64, pulse 72, temperature (!) 97.4 F (36.3 C), resp. rate 16, height 5\' 1"  (1.549 m), weight 53.5 kg, SpO2 100 %. Constitutional: No distress . Vital signs reviewed. Eating food from daughter well. Wrapped up in her quilt. HEENT: NCAT, EOMI, oral membranes moist Neck: supple Cardiovascular: RRR without murmur. No JVD    Respiratory/Chest: CTA Bilaterally without wheezes or rales. Normal effort    GI/Abdomen: BS +, non-tender, non-distended Ext: no clubbing, cyanosis, or edema Psych: pleasant and cooperative, expresses gratitude Skin: Keratotic horn on left shin.   Neuro: HOH. Oriented to self. Able to state age as 27 and DOB as 1[birth year]1. Follows basic commands.  Right sided weakness noted --moves all 4's MSK: can sit at edge of bed with good postural balance. MaxA for repositioning in bed   Assessment/Plan: 1. Functional deficits which require 3+ hours per day of interdisciplinary therapy in a comprehensive inpatient rehab setting. Physiatrist is providing close team supervision and 24 hour management of active medical problems listed below. Physiatrist and rehab team continue to assess barriers to discharge/monitor patient  progress toward functional and medical goals  Care Tool:  Bathing  Bathing activity did not occur: Safety/medical concerns Body parts bathed by patient: Right arm, Chest, Abdomen, Face, Front perineal area   Body parts bathed by helper: Left arm, Buttocks, Right upper leg, Left upper leg, Left lower leg, Right lower leg     Bathing assist Assist Level: Maximal Assistance - Patient 24 - 49%     Upper Body Dressing/Undressing Upper body dressing   What is the patient wearing?: Button up shirt, Dress, Pull over shirt (turtleneck and housecoat)    Upper body assist Assist Level: Maximal Assistance - Patient 25 - 49%    Lower Body Dressing/Undressing Lower body dressing      What is the patient wearing?: Incontinence brief     Lower body assist Assist for lower body dressing: Dependent - Patient 0%     Toileting Toileting    Toileting assist Assist for toileting: 2 Helpers (per most recent staff report)     Transfers Chair/bed transfer  Transfers assist     Chair/bed transfer assist level: Maximal Assistance - Patient 25 - 49% Chair/bed transfer assistive device: Programmer, multimedia   Ambulation assist      Assist level: 2 helpers Assistive device: Walker-rolling Max distance: 1  Walk 10 feet activity   Assist  Walk 10 feet activity did not occur: Safety/medical concerns        Walk 50 feet activity   Assist Walk 50 feet with 2 turns activity did not occur: Safety/medical concerns         Walk 150 feet activity   Assist Walk 150 feet activity did not occur: Safety/medical concerns         Walk 10 feet on uneven surface  activity   Assist Walk 10 feet on uneven surfaces activity did not occur: Safety/medical concerns         Wheelchair     Assist Is the patient using a wheelchair?: Yes Type of Wheelchair: Manual    Wheelchair assist level: Total Assistance - Patient < 25% Max wheelchair distance: 150     Wheelchair 50 feet with 2 turns activity    Assist        Assist Level: Total Assistance - Patient < 25%   Wheelchair 150 feet activity     Assist      Assist Level: Total Assistance - Patient < 25%   Blood pressure 114/64, pulse 72, temperature (!) 97.4 F (36.3 C), resp. rate 16, height 5\' 1"  (1.549 m), weight 53.5 kg, SpO2 100 %.  Medical Problem List and Plan: 1.  Left MCA CVA             -patient may shower             -ELOS/Goals: 2-3 weeks MinA  -Continue CIR therapies including PT, OT, and SLP   -B/C vitamin complex started for stroke recovery  Updated daughters 11/17 2.  Impaired mobility: Continue Lovenox             -antiplatelet therapy: DAPT X 3 months-->to follow up with neurology for further input.  3. Diffuse arthritis: Continue Tylenol #3 1/2 tab at bedtime daily. Scheduled voltaren gel 4 times per day             --tylenol prn  4. Mood: LCSW to follow for evaluation and support.              -antipsychotic agents: N/A 5. Neuropsych: This patient is not fully capable of making decisions on her own behalf. 6. Skin/Wound Care: Pressure relief measures.  --Turn every 2 hours while in bed.              --foam dressing for protection.  7. Fluids/Electrolytes/Nutrition: Monitor I/O.  8. A fib: Monitor HR TID--continue Cardizem 30 mg TID. Supplement IV magnesium for Mag <2 9. Chronic dysphagia: Will continue home diet of ensures with pureed food.   10. Persistent Hypokalemia: Repeat BMP Monday 11. Chronic constipation:  scheduled daily 34mL milk of magnesia. Discussed with family and patient 83. Hypertension: resolved with magnesium supplementation, continue to monitor TID.  13. Suboptimal vitamin D: start on 1,00U daily supplement 14. Sacral pain: offload q2H to prevent pressure injury. Will try to get egg crate foam 15. Shortness of breath: EKG with aflutter. Resolved.  16. Disposition: messaged April to schedule hospital follow-up with me.        LOS: 9 days A FACE TO FACE EVALUATION WAS PERFORMED  May P Eydan Chianese 06/06/2021, 3:22 PM

## 2021-06-06 NOTE — Plan of Care (Signed)
  Problem: RH Balance Goal: LTG: Patient will maintain dynamic sitting balance (OT) Description: LTG:  Patient will maintain dynamic sitting balance with assistance during activities of daily living (OT) Flowsheets (Taken 06/06/2021 1307) LTG: Pt will maintain dynamic sitting balance during ADLs with: (LTG downgraded due to limited progress.) Moderate Assistance - Patient 50 - 74% Note: LTG downgraded due to limited progress.   Problem: Sit to Stand Goal: LTG:  Patient will perform sit to stand in prep for activites of daily living with assistance level (OT) Description: LTG:  Patient will perform sit to stand in prep for activites of daily living with assistance level (OT) Flowsheets (Taken 06/06/2021 1307) LTG: PT will perform sit to stand in prep for activites of daily living with assistance level: (LTG downgraded due to limited progress.) Moderate Assistance - Patient 50 - 74% Note: LTG downgraded due to limited progress.   Problem: RH Bathing Goal: LTG Patient will bathe all body parts with assist levels (OT) Description: LTG: Patient will bathe all body parts with assist levels (OT) Flowsheets (Taken 06/06/2021 1307) LTG: Pt will perform bathing with assistance level/cueing: (LTG downgraded due to limited progress.) Maximal Assistance - Patient 25 - 49% Note: LTG downgraded due to limited progress.   Problem: RH Dressing Goal: LTG Patient will perform upper body dressing (OT) Description: LTG Patient will perform upper body dressing with assist, with/without cues (OT). Flowsheets (Taken 06/06/2021 1307) LTG: Pt will perform upper body dressing with assistance level of: (LTG downgraded due to limited progress.) Maximal Assistance - Patient 25 - 49% Note: LTG downgraded due to limited progress.   Problem: RH Toileting Goal: LTG Patient will perform toileting task (3/3 steps) with assistance level (OT) Description: LTG: Patient will perform toileting task (3/3 steps) with assistance  level (OT)  Flowsheets (Taken 06/06/2021 1307) LTG: Pt will perform toileting task (3/3 steps) with assistance level: (LTG  discontinued as pt has been incontinent.) -- Note: LTG discontinued as pt has been incontinent and with her posterior lean she is not safe sitting on a BSC.   Problem: RH Functional Use of Upper Extremity Goal: LTG Patient will use RT/LT upper extremity as a (OT) Description: LTG: Patient will use right/left upper extremity as a stabilizer/gross assist/diminished/nondominant/dominant level with assist, with/without cues during functional activity (OT) Flowsheets (Taken 06/06/2021 1307) LTG: Use of upper extremity in functional activities: (LTG downgraded due to limited progress.) -- LTG: Pt will use upper extremity in functional activity with assistance level of: Minimal Assistance - Patient > 75% Note: LTG downgraded due to limited progress.   Problem: RH Toilet Transfers Goal: LTG Patient will perform toilet transfers w/assist (OT) Description: LTG: Patient will perform toilet transfers with assist, with/without cues using equipment (OT) Flowsheets (Taken 06/06/2021 1307) LTG: Pt will perform toilet transfers with assistance level of: (LTG discontinued as pt has been incontinent and with her posterior lean she is not safe sitting on a BSC.) -- Note: LTG discontinued as pt has been incontinent and with her posterior lean she is not safe sitting on a BSC.

## 2021-06-06 NOTE — Progress Notes (Signed)
Physical Therapy Weekly Progress Note  Patient Details  Name: Sherry [name] MRN: [id number] Date of Birth: 29-Jan-1920  Beginning of progress report period: May 29, 2021 End of progress report period: June 06, 2021  Today's Date: 06/06/2021 PT Missed Time: 75 Minutes Missed Time Reason: Patient fatigue  Patient has met 5 of 7 long term goals. Pt making slow but appropriate progress towards goals. Overall, she is able to roll in bed towards her R with minA, towards her L with maxA, supine<>sit with minA with HOB elevated, and requires modA for forward scooting to EOB. She requires minA for static sitting balance due to posterior leans, requires mod to maxA for sit<>stand transfers and mod to maxA for stand<>pivot transfers with use of the RW. She has been able to ambulate ~80f with minA and RW with +2 assist for w/c folow for safety.   Patient continues to demonstrate the following deficits muscle weakness and muscle joint tightness, decreased cardiorespiratoy endurance, motor apraxia, decreased coordination, and decreased motor planning, decreased motor planning, decreased initiation, decreased attention, decreased awareness, decreased problem solving, decreased safety awareness, decreased memory, and delayed processing, and decreased sitting balance, decreased standing balance, decreased postural control, hemiplegia, and decreased balance strategies and therefore will continue to benefit from skilled PT intervention to increase functional independence with mobility.  Patient progressing toward long term goals..  Continue plan of care.  PT Short Term Goals Week 2:  PT Short Term Goal 1 (Week 2): STG = LTG due to ELOS  Skilled Therapeutic Interventions/Progress Updates:     Pt supine in bed sleeping on arrival. Dtr at the bedside. Pt pleasantly refuses therapy due to fatigue, requesting to stay in bed. Dtr in agreement. Pt missed 75 minutes of skilled physical therapy.   Therapy  Documentation Precautions:  Precautions Precautions: Fall Precaution Comments: Hard of hearing, sacral pressure injury, R hemi, arthritic Restrictions Weight Bearing Restrictions: No General:    Therapy/Group: Individual Therapy  CAlger Simons11/18/2022, 7:36 AM

## 2021-06-06 NOTE — Progress Notes (Signed)
Speech Language Pathology Weekly Progress and Session Note  Patient Details  Name: Sherry [name] MRN: [id number] Date of Birth: 02/23/1920  Beginning of progress report period: 05/29/2021 End of progress report period:  06/06/2021  Today's Date: 06/06/2021 SLP Individual Time:  -     Short Term Goals: Week 1: SLP Short Term Goal 1 (Week 1): Pt will consume current diet without overt s/sx of aspiration with min A verbal cues for use of swallowing compensatory strategies SLP Short Term Goal 1 - Progress (Week 1): Met SLP Short Term Goal 2 (Week 1): Patient will utilize speech intelligiblity strategies at the word level with mod-to-max A verbal cues to achieve 75-90% intelligiblity SLP Short Term Goal 2 - Progress (Week 1): Met SLP Short Term Goal 3 (Week 1): Pt will utilize call bell to request assistance with mod A verbal cues in 50% of observable opportunities SLP Short Term Goal 3 - Progress (Week 1): Met SLP Short Term Goal 4 (Week 1): Pt will communicate functional needs to staff with mod-to-max A verbal cues SLP Short Term Goal 4 - Progress (Week 1): Met SLP Short Term Goal 5 (Week 1): Pt will demonstrate awareness of speech errors during 50% of occasions with mod-to-max A verbal cues SLP Short Term Goal 5 - Progress (Week 1): Not met    New Short Term Goals: Week 2: SLP Short Term Goal 1 (Week 2): LTG=STG  Weekly Progress Updates:  Patient met 4/5 conservatively set goals for speech/communication and cognitive function as well as meeting goal for swallow function. As per family report, patient was on a pureed diet, thin liquids prior to admission and she is currently tolerating this diet without overt s/s aspiration or penetration. Patient did not meet goal of awareness to errors in speech, but she has been demonstrating improved speech intelligibility overall and is able to improve speech production when cued to repeat, slow down, speak slower. SLP has discharged swallow function  goals but plans to continue with basic level speech production/intelligibility and basic level cognitive function goals to maximize her function in these areas prior to discharge.   Intensity: Minumum of 1-2 x/day, 30 to 90 minutes Frequency: 3 to 5 out of 7 days Duration/Length of Stay: 11/23 Treatment/Interventions: Speech/Language facilitation;Cueing hierarchy;Dysphagia/aspiration precaution training;Patient/family education;Therapeutic Activities    Sonia Baller, MA, CCC-SLP Speech Therapy

## 2021-06-06 NOTE — Progress Notes (Signed)
Occupational Therapy Weekly Progress Note  Patient Details  Name: Sherry [name] MRN: [id number] Date of Birth: Sep 24, 1919  Beginning of progress report period: May 29, 2021 End of progress report period: June 06, 2021  Today's Date: 06/06/2021 OT Individual Time: 0930-1030 OT Individual Time Calculation (min): 60 min    Patient has met 1 of 4 short term goals.  Pt has not been able to make a great deal of progress with her self care, mostly because she is accustomed to her family providing a great deal of A at home, she has a CNA that assists her also.  She is doing well with sit to stands and static standing balance, which decreases burden of care with LB care. Her posterior lean continues to limit her dynamic balance and transfers.  Patient continues to demonstrate the following deficits: muscle weakness and muscle joint tightness, decreased cardiorespiratoy endurance, abnormal tone, unbalanced muscle activation, and decreased coordination, decreased attention to right, and decreased sitting balance, decreased standing balance, decreased postural control, hemiplegia, and decreased balance strategies and therefore will continue to benefit from skilled OT intervention to enhance overall performance with BADL and Reduce care partner burden.  Patient not progressing toward long term goals.  See goal revision..  Plan of care revisions: .Marland Kitchen Problem: RH Balance Goal: LTG: Patient will maintain dynamic sitting balance (OT) Description: LTG:  Patient will maintain dynamic sitting balance with assistance during activities of daily living (OT) Flowsheets (Taken 06/06/2021 1307) LTG: Pt will maintain dynamic sitting balance during ADLs with: (LTG downgraded due to limited progress.) Moderate Assistance - Patient 50 - 74% Note: LTG downgraded due to limited progress.   Problem: Sit to Stand Goal: LTG:  Patient will perform sit to stand in prep for activites of daily living with assistance  level (OT) Description: LTG:  Patient will perform sit to stand in prep for activites of daily living with assistance level (OT) Flowsheets (Taken 06/06/2021 1307) LTG: PT will perform sit to stand in prep for activites of daily living with assistance level: (LTG downgraded due to limited progress.) Moderate Assistance - Patient 50 - 74% Note: LTG downgraded due to limited progress.   Problem: RH Bathing Goal: LTG Patient will bathe all body parts with assist levels (OT) Description: LTG: Patient will bathe all body parts with assist levels (OT) Flowsheets (Taken 06/06/2021 1307) LTG: Pt will perform bathing with assistance level/cueing: (LTG downgraded due to limited progress.) Maximal Assistance - Patient 25 - 49% Note: LTG downgraded due to limited progress.   Problem: RH Dressing Goal: LTG Patient will perform upper body dressing (OT) Description: LTG Patient will perform upper body dressing with assist, with/without cues (OT). Flowsheets (Taken 06/06/2021 1307) LTG: Pt will perform upper body dressing with assistance level of: (LTG downgraded due to limited progress.) Maximal Assistance - Patient 25 - 49% Note: LTG downgraded due to limited progress.   Problem: RH Toileting Goal: LTG Patient will perform toileting task (3/3 steps) with assistance level (OT) Description: LTG: Patient will perform toileting task (3/3 steps) with assistance level (OT)  Flowsheets (Taken 06/06/2021 1307) LTG: Pt will perform toileting task (3/3 steps) with assistance level: (LTG  discontinued as pt has been incontinent.) -- Note: LTG discontinued as pt has been incontinent and with her posterior lean she is not safe sitting on a BSC.   Problem: RH Functional Use of Upper Extremity Goal: LTG Patient will use RT/LT upper extremity as a (OT) Description: LTG: Patient will use right/left upper extremity as a  stabilizer/gross assist/diminished/nondominant/dominant level with assist, with/without cues during  functional activity (OT) Flowsheets (Taken 06/06/2021 1307) LTG: Use of upper extremity in functional activities: (LTG downgraded due to limited progress.) -- LTG: Pt will use upper extremity in functional activity with assistance level of: Minimal Assistance - Patient > 75% Note: LTG downgraded due to limited progress.   Problem: RH Toilet Transfers Goal: LTG Patient will perform toilet transfers w/assist (OT) Description: LTG: Patient will perform toilet transfers with assist, with/without cues using equipment (OT) Flowsheets (Taken 06/06/2021 1307) LTG: Pt will perform toilet transfers with assistance level of: (LTG discontinued as pt has been incontinent and with her posterior lean she is not safe sitting on a BSC.) -- Note: LTG discontinued as pt has been incontinent and with her posterior lean she is not safe sitting on a BSC.  OT Short Term Goals Week 1:  OT Short Term Goal 1 (Week 1): Pt will self feed for 25% of meal with mod assist. OT Short Term Goal 1 - Progress (Week 1): Not progressing OT Short Term Goal 2 (Week 1): Pt will complete UB dressing with mod assist to donn a pullover shirt. OT Short Term Goal 2 - Progress (Week 1): Not progressing OT Short Term Goal 3 (Week 1): Pt will complete sit to stand for LB selfcare tasks with max assist. OT Short Term Goal 3 - Progress (Week 1): Met OT Short Term Goal 4 (Week 1): Pt will use the RUE at a gross assist level with min facilitation during selfcare tasks. OT Short Term Goal 4 - Progress (Week 1): Progressing toward goal Week 2:  OT Short Term Goal 1 (Week 2): STGs = LTGs  Skilled Therapeutic Interventions/Progress Updates:    Pt received in bed with dtr in room, pt in a soaking wet brief.  Pt worked on rolling in bed for therapist to assist her with brief change. When pt on her R side she was able to use L hand to reach back and partially wash bottom.  On her back pt cued to wash her perineal area, which she did.  Pt then sat to  EOB to engage in self care.  See ADL doc for details.  At EOB, pt leaning back and to her R requiring MOD A at times, she can sit statically with close S but does lean to the R.   Due to posterior lean and motor planning deficits, pt needed MAX to sq pivot to wc to her R.   With self care, needed mod cues to engage use of RUE. She has fairly good ROM and strength but a mod impaired neglect of RUE.  Pt asked to stand at sink.  Once standing, she had a posterior lean requiring mod A but able to stand fairly upright.    Worked on active movement of R hand to "clean the sink" with a paper towel in standing.   From wc, used dowel bar for BUE A/arom using L to help the R arm with increased ROM.  Then used a ball for pt to hold between her hands, pt moved ball forward and side to side with cues.  Good participation with exercises.  R grasp 3+/5. Pt resting in wc with all needs met. Belt alarm on and dtr in the room with the patient.     Therapy Documentation Precautions:  Precautions Precautions: Fall Precaution Comments: Hard of hearing, sacral pressure injury, R hemi, arthritic Restrictions Weight Bearing Restrictions: No     Pain:  No c/o pain ADL: ADL Eating: Dependent Where Assessed-Eating: Bed level Grooming: Maximal assistance (per most recent staff report) Where Assessed-Grooming: Edge of bed Upper Body Bathing: Moderate assistance Where Assessed-Upper Body Bathing: Edge of bed Lower Body Bathing: Maximal assistance Where Assessed-Lower Body Bathing: Edge of bed Upper Body Dressing: Maximal assistance  Where Assessed-Upper Body Dressing: Edge of bed Lower Body Dressing: Dependent  Where Assessed-Lower Body Dressing: Edge of bed Toileting: Dependent (per most recent staff report) Where Assessed-Toileting: Bedside Commode Toilet Transfer: Maximal assistance Toilet Transfer Method: Stand pivot Science writer: Engineer, technical sales Transfer: Not  assessed Social research officer, government: Not assessed   Therapy/Group: Individual Therapy  Rocky Ripple 06/06/2021, 1:24 PM

## 2021-06-07 DIAGNOSIS — I10 Essential (primary) hypertension: Secondary | ICD-10-CM

## 2021-06-07 DIAGNOSIS — I63512 Cerebral infarction due to unspecified occlusion or stenosis of left middle cerebral artery: Secondary | ICD-10-CM | POA: Diagnosis not present

## 2021-06-07 DIAGNOSIS — M1990 Unspecified osteoarthritis, unspecified site: Secondary | ICD-10-CM

## 2021-06-07 DIAGNOSIS — E871 Hypo-osmolality and hyponatremia: Secondary | ICD-10-CM | POA: Diagnosis not present

## 2021-06-07 DIAGNOSIS — J029 Acute pharyngitis, unspecified: Secondary | ICD-10-CM

## 2021-06-07 MED ORDER — PHENOL 1.4 % MT LIQD
1.0000 | OROMUCOSAL | Status: DC | PRN
  Administered 2021-06-10: 1 via OROMUCOSAL
  Filled 2021-06-07: qty 177

## 2021-06-07 NOTE — Progress Notes (Signed)
PROGRESS NOTE   Subjective/Complaints: Patient seen laying in bed this morning.  Daughter at bedside.  Daughter states patient slept well overnight.  Daughter states patient's mouth is sore.  ROS:?  Reliability due to cognition/HOH  Objective:   No results found. Recent Labs    06/05/21 0500  WBC 3.8*  HGB 11.8*  HCT 35.7*  PLT 367     No results for input(s): NA, K, CL, CO2, GLUCOSE, BUN, CREATININE, CALCIUM in the last 72 hours.    Intake/Output Summary (Last 24 hours) at 06/07/2021 1227 Last data filed at 06/07/2021 0810 Gross per 24 hour  Intake 480 ml  Output --  Net 480 ml          Physical Exam: Vital Signs Blood pressure (!) 146/81, pulse 76, temperature 98 F (36.7 C), temperature source Oral, resp. rate 16, height 5\' 1"  (1.549 m), weight 53.5 kg, SpO2 100 %. Constitutional: No distress . Vital signs reviewed. HENT: Normocephalic.  Atraumatic.  No lesions noted in mouth. Eyes: EOMI. No discharge. Cardiovascular: No JVD.  Irregularly irregular. Respiratory: Normal effort.  No stridor.  Bilateral clear to auscultation. GI: Non-distended.  BS +. Skin: Warm and dry.  Intact. Psych: Slowed.  Confused. Musc: No edema in extremities.  No tenderness in extremities. Neuro: [facility] Moving all extremities.   Assessment/Plan: 1. Functional deficits which require 3+ hours per day of interdisciplinary therapy in a comprehensive inpatient rehab setting. Physiatrist is providing close team supervision and 24 hour management of active medical problems listed below. Physiatrist and rehab team continue to assess barriers to discharge/monitor patient progress toward functional and medical goals  Care Tool:  Bathing  Bathing activity did not occur: Safety/medical concerns Body parts bathed by patient: Right arm, Chest, Abdomen, Face, Front perineal area   Body parts bathed by helper: Left arm, Buttocks,  Right upper leg, Left upper leg, Left lower leg, Right lower leg     Bathing assist Assist Level: Maximal Assistance - Patient 24 - 49%     Upper Body Dressing/Undressing Upper body dressing   What is the patient wearing?: Button up shirt, Dress, Pull over shirt (turtleneck and housecoat)    Upper body assist Assist Level: Maximal Assistance - Patient 25 - 49%    Lower Body Dressing/Undressing Lower body dressing      What is the patient wearing?: Incontinence brief     Lower body assist Assist for lower body dressing: Dependent - Patient 0%     Toileting Toileting    Toileting assist Assist for toileting: 2 Helpers (per most recent staff report)     Transfers Chair/bed transfer  Transfers assist     Chair/bed transfer assist level: Minimal Assistance - Patient > 75% Chair/bed transfer assistive device: Programmer, multimedia   Ambulation assist      Assist level: 2 helpers Assistive device: Walker-rolling Max distance: 1   Walk 10 feet activity   Assist  Walk 10 feet activity did not occur: Safety/medical concerns        Walk 50 feet activity   Assist Walk 50 feet with 2 turns activity did not occur: Safety/medical concerns  Walk 150 feet activity   Assist Walk 150 feet activity did not occur: Safety/medical concerns         Walk 10 feet on uneven surface  activity   Assist Walk 10 feet on uneven surfaces activity did not occur: Safety/medical concerns         Wheelchair     Assist Is the patient using a wheelchair?: Yes Type of Wheelchair: Manual    Wheelchair assist level: Total Assistance - Patient < 25% Max wheelchair distance: 150    Wheelchair 50 feet with 2 turns activity    Assist        Assist Level: Total Assistance - Patient < 25%   Wheelchair 150 feet activity     Assist      Assist Level: Total Assistance - Patient < 25%   Blood pressure (!) 146/81, pulse 76,  temperature 98 F (36.7 C), temperature source Oral, resp. rate 16, height 5\' 1"  (1.549 m), weight 53.5 kg, SpO2 100 %.  Medical Problem List and Plan: 1.  Left MCA CVA  Continue CIR  -B/C vitamin complex started for stroke recovery 2.  Impaired mobility: Continue Lovenox             -antiplatelet therapy: DAPT X 3 months-->to follow up with neurology for further input.  3. Diffuse arthritis: Continue Tylenol #3 1/2 tab at bedtime daily. Scheduled voltaren gel 4 times per day             --tylenol prn   Controlled with meds on 11/19 4. Mood: LCSW to follow for evaluation and support.              -antipsychotic agents: N/A 5. Neuropsych: This patient is not fully capable of making decisions on her own behalf. 6. Skin/Wound Care: Pressure relief measures.  --Turn every 2 hours while in bed.              --foam dressing for protection.  7. Fluids/Electrolytes/Nutrition: Monitor I/Os.  8. A fib: Monitor HR TID--continue Cardizem 30 mg TID. Supplement IV magnesium for Mag <2  Rate controlled on 11/19 9. Chronic dysphagia: Will continue home diet of ensures with pureed food.    D1 thins 10. Hypokalemia: Resolved 11. Chronic constipation:  scheduled daily 44mL milk of magnesia.  12. Hypertension: resolved with magnesium supplementation, continue to monitor TID.   Relatively controlled 11/19 13. Suboptimal vitamin D: start on 1,00U daily supplement 14. Sacral pain: offload q2H to prevent pressure injury. Will try to get egg crate foam 15. Shortness of breath: EKG with aflutter.   16.  Hyponatremia  Sodium 132 on 11/14, continue to monitor. 17.  Sore throat  Chloraseptic Spray ordered  LOS: 10 days A FACE TO FACE EVALUATION WAS PERFORMED  Ahna Konkle 12/14 06/07/2021, 12:27 PM

## 2021-06-07 NOTE — Progress Notes (Signed)
Patient refused scd's.

## 2021-06-07 NOTE — Progress Notes (Signed)
Physical Therapy Session Note  Patient Details  Name: Sherry [name] MRN: [id number] Date of Birth: January 11, 1920  Today's Date: 06/07/2021 PT Individual Time: 0805-0850 PT Individual Time Calculation (min): 45 min   and  Today's Date: 06/07/2021 PT Missed Time: 15 Minutes Missed Time Reason: Patient fatigue  Short Term Goals: Week 2:  PT Short Term Goal 1 (Week 2): STG = LTG due to ELOS  Skilled Therapeutic Interventions/Progress Updates:    Pt received supine in bed with nursing staff present for morning medication administration and pt's family present. With encouragement, pt agreeable to therapy session. Supine>sitting R EOB, HOB flat but using bedrail, with heavy min assist for bringing trunk forward and upright but then pt able to scoot to EOB with only heavy min assist today demoing improved anterior trunk lean. Sitting EOB donned shoes total assist. Sit>stand EOB>RW with heavy min assist for lifting to stand, blocking R foot from sliding forawrd on ground for safety but not really happening at this time. L stand pivot to BSC using RW with min assist for balance and AD management as pt demos improved ability to step with no posterior lean today. Continent of bowels and bladder. Pt's back noted to be wet from sweat therefore doffed shirt and gown with mod assist, performed UB  bathing with mod assist and donned clean shirt and gown max assist. Sit>stand BSC>RW with heavy min assist for lifting to come to stand and therapist continuing to block R foot from sliding forward, which occurs more from this surface because it is too high causing her feet not to touch the floor fully. Standing with B UE support on RW and CGA for balance (much improved compared to last time seen by this therapist) while +2 assist provided total assist peri-care and LB clothing management. R stand pivot to EOB using RW with light min assist for balance as described above. Pt politely declining continued participation in  therapy session at this time and requesting to return to bed to rest. Sit>supine with supervision and pt able to slightly scoot up towards [facility] without assist. Therapist encouraging pt to lie R semi-sidelying for pressure relief of sacrum - pillows provided for positioning. Pt left in bed with her family present, needs in reach, and bed alarm on. Missed 15 minutes of skilled physical therapy.  Therapy Documentation Precautions:  Precautions Precautions: Fall Precaution Comments: Hard of hearing, sacral pressure injury, R hemi, arthritic Restrictions Weight Bearing Restrictions: No   Pain:  No reports of pain throughout session.    Therapy/Group: Individual Therapy  Ginny Forth , PT, DPT, NCS, CSRS  06/07/2021, 7:53 AM

## 2021-06-09 DIAGNOSIS — I63512 Cerebral infarction due to unspecified occlusion or stenosis of left middle cerebral artery: Secondary | ICD-10-CM | POA: Diagnosis not present

## 2021-06-09 LAB — BASIC METABOLIC PANEL
Anion gap: 7 (ref 5–15)
BUN: 11 mg/dL (ref 8–23)
CO2: 26 mmol/L (ref 22–32)
Calcium: 9 mg/dL (ref 8.9–10.3)
Chloride: 101 mmol/L (ref 98–111)
Creatinine, Ser: 1.12 mg/dL — ABNORMAL HIGH (ref 0.44–1.00)
GFR, Estimated: 44 mL/min — ABNORMAL LOW (ref >60)
Glucose, Bld: 90 mg/dL (ref 70–99)
Potassium: 4.7 mmol/L (ref 3.5–5.1)
Sodium: 134 mmol/L — ABNORMAL LOW (ref 135–145)

## 2021-06-09 LAB — CBC
HCT: 37.4 % (ref 36.0–46.0)
Hemoglobin: 12 g/dL (ref 12.0–15.0)
MCH: 27.5 pg (ref 26.0–34.0)
MCHC: 32.1 g/dL (ref 30.0–36.0)
MCV: 85.8 fL (ref 80.0–100.0)
Platelets: 406 10*3/uL — ABNORMAL HIGH (ref 150–400)
RBC: 4.36 MIL/uL (ref 3.87–5.11)
RDW: 17.5 % — ABNORMAL HIGH (ref 11.5–15.5)
WBC: 4.4 10*3/uL (ref 4.0–10.5)
nRBC: 0 % (ref 0.0–0.2)

## 2021-06-09 MED ORDER — ACETAMINOPHEN 325 MG PO TABS
650.0000 mg | ORAL_TABLET | Freq: Two times a day (BID) | ORAL | Status: DC
  Administered 2021-06-10 – 2021-06-11 (×3): 650 mg via ORAL
  Filled 2021-06-09 (×4): qty 2

## 2021-06-09 NOTE — Progress Notes (Signed)
Physical Therapy Session Note  Patient Details  Name: Sherry [name] MRN: [id number] Date of Birth: 12-15-1919  Today's Date: 06/09/2021 PT Individual Time: 9528-4132 PT Individual Time Calculation (min): 30 min   Short Term Goals: Week 1:  PT Short Term Goal 1 (Week 1): Patient will complete sit <> supine with MaxA x1 PT Short Term Goal 2 (Week 1): Patient will transfer bed <> wc with LRAD and MaxA x1 PT Short Term Goal 3 (Week 1): Patient will ambulate 62ft with LRAD, MaxA x1 and wc follow as needed Week 2:  PT Short Term Goal 1 (Week 2): STG = LTG due to ELOS Week 3:     Skilled Therapeutic Interventions/Progress Updates:     Pt denies pain.  Pt initially supine and agreeable to session.  Supine to sit w/bedrail and min assist.  Sits and scoots w/cga and cues.  Sit to stand repeated 4X during session w/cues and therapist blocking R foot to prevent sliding, min assist. Gait 77ft x 1, 72ft x 2 including turning to sit to chair w/min assist for management of walker and balance.   Pt left oob in recliner w/chair alarm set and needs in reach.  Therapy Documentation Precautions:  Precautions Precautions: Fall Precaution Comments: Hard of hearing, sacral pressure injury, R hemi, arthritic Restrictions Weight Bearing Restrictions: No     Therapy/Group: Individual Therapy [name], PT   [name] 06/09/2021, 12:09 PM

## 2021-06-09 NOTE — Progress Notes (Signed)
Patient resting in bed, C/O ABD pain. PRN suppository given as ordered this am. Patent still had not had a BM and was continued to C/O ABD pain. PRN enema given and effective this evening. Enema effective, large BM. Safety maintained. Daughter at bedside.

## 2021-06-09 NOTE — Progress Notes (Signed)
PROGRESS NOTE   Subjective/Complaints: Patient working in gym with Ephriam Knuckles this morning- she says she does not feel very well and is not sure why. She notes her last BM was 2 days ago- discussed with nursing and will give suppository now  ROS: +constipation  Objective:   No results found. Recent Labs    06/09/21 0540  WBC 4.4  HGB 12.0  HCT 37.4  PLT 406*    Recent Labs    06/09/21 0540  NA 134*  K 4.7  CL 101  CO2 26  GLUCOSE 90  BUN 11  CREATININE 1.12*  CALCIUM 9.0      Intake/Output Summary (Last 24 hours) at 06/09/2021 1112 Last data filed at 06/09/2021 0830 Gross per 24 hour  Intake 620 ml  Output --  Net 620 ml         Physical Exam: Vital Signs Blood pressure 131/78, pulse 66, temperature (!) 97.4 F (36.3 C), resp. rate 16, height 5\' 1"  (1.549 m), weight 53.5 kg, SpO2 100 %. Constitutional: No distress . Vital signs reviewed. HENT: Normocephalic.  Atraumatic.  No lesions noted in mouth. Eyes: EOMI. No discharge. Cardiovascular: No JVD.  Irregularly irregular. Respiratory: Normal effort.  No stridor.  Bilateral clear to auscultation. GI: Non-distended.  BS +. Skin:  Small sacral opening Psych: Slowed.  Confused. Musc: No edema in extremities.  No tenderness in extremities. Neuro: [facility] Moving all extremities.   Assessment/Plan: 1. Functional deficits which require 3+ hours per day of interdisciplinary therapy in a comprehensive inpatient rehab setting. Physiatrist is providing close team supervision and 24 hour management of active medical problems listed below. Physiatrist and rehab team continue to assess barriers to discharge/monitor patient progress toward functional and medical goals  Care Tool:  Bathing  Bathing activity did not occur: Safety/medical concerns Body parts bathed by patient: Right arm, Chest, Abdomen, Face, Front perineal area   Body parts bathed by  helper: Left arm, Buttocks, Right upper leg, Left upper leg, Left lower leg, Right lower leg     Bathing assist Assist Level: Maximal Assistance - Patient 24 - 49%     Upper Body Dressing/Undressing Upper body dressing   What is the patient wearing?: Button up shirt, Dress, Pull over shirt (turtleneck and housecoat)    Upper body assist Assist Level: Maximal Assistance - Patient 25 - 49%    Lower Body Dressing/Undressing Lower body dressing      What is the patient wearing?: Incontinence brief     Lower body assist Assist for lower body dressing: Dependent - Patient 0%     Toileting Toileting    Toileting assist Assist for toileting: 2 Helpers (per most recent staff report)     Transfers Chair/bed transfer  Transfers assist     Chair/bed transfer assist level: Minimal Assistance - Patient > 75% Chair/bed transfer assistive device: [facility]   Ambulation assist      Assist level: 2 helpers Assistive device: Walker-rolling Max distance: 1   Walk 10 feet activity   Assist  Walk 10 feet activity did not occur: Safety/medical concerns        Walk 50 feet activity  Assist Walk 50 feet with 2 turns activity did not occur: Safety/medical concerns         Walk 150 feet activity   Assist Walk 150 feet activity did not occur: Safety/medical concerns         Walk 10 feet on uneven surface  activity   Assist Walk 10 feet on uneven surfaces activity did not occur: Safety/medical concerns         Wheelchair     Assist Is the patient using a wheelchair?: Yes Type of Wheelchair: Manual    Wheelchair assist level: Total Assistance - Patient < 25% Max wheelchair distance: 150    Wheelchair 50 feet with 2 turns activity    Assist        Assist Level: Total Assistance - Patient < 25%   Wheelchair 150 feet activity     Assist      Assist Level: Total Assistance - Patient < 25%   Blood pressure  131/78, pulse 66, temperature (!) 97.4 F (36.3 C), resp. rate 16, height 5\' 1"  (1.549 m), weight 53.5 kg, SpO2 100 %.  Medical Problem List and Plan: 1.  Left MCA CVA  Continue CIR  -B/C vitamin complex started for stroke recovery 2.  Impaired mobility: Continue Lovenox             -antiplatelet therapy: DAPT X 3 months-->to follow up with neurology for further input.  3. Diffuse arthritis: Continue Tylenol #3 1/2 tab at bedtime daily. Scheduled voltaren gel 4 times per day 4. Mood: LCSW to follow for evaluation and support.              -antipsychotic agents: N/A 5. Neuropsych: This patient is not fully capable of making decisions on her own behalf. 6. Small sacral opening: Pressure relief measures.  --Turn every 2 hours while in bed.              --foam dressing for protection. -schedule tylenol with breakfast and lunch as well. Check LFTs tomorrow morning.  7. Fluids/Electrolytes/Nutrition: Monitor I/Os.  8. A fib: Monitor HR TID--continue Cardizem 30 mg TID. Supplement IV magnesium for Mag <2  Rate controlled on 11/19 9. Chronic dysphagia: Will continue home diet of ensures with pureed food.    D1 thins 10. Hypokalemia: Resolved 11. Chronic constipation:  scheduled daily 71mL milk of magnesia. Suppository today, check magnesium level tomorrow morning.  12. Hypertension: resolved with magnesium supplementation, continue to monitor TID.   Relatively controlled 11/19 13. Suboptimal vitamin D: start on 1,00U daily supplement 14. Sacral pain: offload q2H to prevent pressure injury. Will try to get egg crate foam 15. Shortness of breath: EKG with aflutter.   16.  Hyponatremia  Sodium 132 on 11/14, continue to monitor. 17.  Sore throat  Chloraseptic Spray ordered 18. AKI: placed nursing order to encourage  6-8 glasses of water per day, repeat creatinine tomorrow morning.   LOS: 12 days A FACE TO FACE EVALUATION WAS PERFORMED  Emmauel Hallums P Quorra Rosene 06/09/2021, 11:12 AM

## 2021-06-09 NOTE — Progress Notes (Signed)
Occupational Therapy Session Note  Patient Details  Name: Sherry [name] MRN: [id number] Date of Birth: 08/03/1919  Today's Date: 06/09/2021 OT Individual Time: 9629-5284 OT Individual Time Calculation (min): 55 min    Short Term Goals: Week 2:  OT Short Term Goal 1 (Week 2): STGs = LTGs   Skilled Therapeutic Interventions/Progress Updates:    Pt semi reclined in bed, reports no pain. Dtr requesting pt to bathe and pt agreeable to do so sitting EOB. Supine to sit with min assist, increased time and step by step cues.  Pt able to sit EOB with CGA. Sit<>stand at [facility] with mod assist for LB dressing and bathing.  Pt doffed shirt with mod assist and brief/slip skirt with mod assist.  Pt bathed UB and LB with min asssit seated and standing.  After drying off, pt donned shirt with mod assist.  Donned skirt and brief with mod assist at sit<>stand level at RW.  Pt needing multimodal cues for hand placement during all transfers and manual facilitation when standing to weight shift forward. Returned to supine with max assist.  Pt able to reposition towards [facility] with step by step cues and increased time. Pt showed improved activity tolerance today with increased independence during LB bathing and dressing. Call bell in reach, seat alarm on at end of session.     Therapy Documentation Precautions:  Precautions Precautions: Fall Precaution Comments: Hard of hearing, sacral pressure injury, R hemi, arthritic Restrictions Weight Bearing Restrictions: No General:   Vital Signs: Therapy Vitals Temp: 98 F (36.7 C) Pulse Rate: 82 Resp: 16 BP: 123/78 Patient Position (if appropriate): Lying Oxygen Therapy SpO2: 100 % O2 Device: Room Air Pain:   ADL: ADL Eating: Dependent Where Assessed-Eating: Bed level Grooming: Maximal assistance (per most recent staff report) Where Assessed-Grooming: Edge of bed Upper Body Bathing: Moderate assistance Where Assessed-Upper Body Bathing: Edge of bed Lower  Body Bathing: Maximal assistance Where Assessed-Lower Body Bathing: Edge of bed Upper Body Dressing: Maximal assistance (per most recent staff report) Where Assessed-Upper Body Dressing: Edge of bed Lower Body Dressing: Dependent (per most recent staff report) Where Assessed-Lower Body Dressing: Edge of bed Toileting: Dependent (per most recent staff report) Where Assessed-Toileting: Bedside Commode Toilet Transfer: Maximal assistance Toilet Transfer Method: Stand pivot Toilet Transfer Equipment: Animator Transfer: Not assessed Film/video editor: Not assessed Vision   Perception    Praxis   Exercises:   Other Treatments:     Therapy/Group: Individual Therapy  Amie Critchley 06/09/2021, 3:13 PM

## 2021-06-09 NOTE — Progress Notes (Signed)
Physical Therapy Session Note  Patient Details  Name: Sherry [name] MRN: [id number] Date of Birth: Mar 04, 1920  Today's Date: 06/09/2021 PT Individual Time: 1015-1100 PT Individual Time Calculation (min): 45 min   Short Term Goals: Week 2:  PT Short Term Goal 1 (Week 2): STG = LTG due to ELOS  Skilled Therapeutic Interventions/Progress Updates:     Pt seated in recliner at start of session, dtr at the bedside. Pt agreeable to PT tx but reports some buttock pain, related to her sacral pressure injury. Pain unrated - rest breaks and mobility provided for pain management, MD also made aware during morning rounds. Pt completed sit<>stand to RW with min/modA, R foot noted to slide forward, requiring blocking. With RW, she ambulated within her room, ~47ft, with minA - demo's forward flexed trunk with short shuffling steps, BLE ridged with minimal knee flexion in swing. Transported to main rehab gym in w/c for time and energy conservation. Worked on standing activities to promote sacral offloading for skin protection. She required modA for sit<>stand from w/c height to high/low table. She required minA for standing balance as she completed simple puzzle that required max verbal cues for accuracy. Standing tolerance up to 3 minutes for each stand (x2 stands) with crepitus noted in R knee/hip in standing. Transported back to her room in w/c and assisted back to bed with modA stand<>pivot transfer with use of bed rail. Required totalA for sit>supine and then totalA for scooting up in bed. Pt positioned in semi-sidelying, HOB slightly elevated, all needs in reach. Bed alarm on with dtr at bedside.   Therapy Documentation Precautions:  Precautions Precautions: Fall Precaution Comments: Hard of hearing, sacral pressure injury, R hemi, arthritic Restrictions Weight Bearing Restrictions: No General:    Therapy/Group: Individual Therapy  Orrin Brigham 06/09/2021, 7:32 AM

## 2021-06-10 ENCOUNTER — Inpatient Hospital Stay (HOSPITAL_COMMUNITY): Payer: Medicare Other

## 2021-06-10 LAB — COMPREHENSIVE METABOLIC PANEL
ALT: 14 U/L (ref 0–44)
AST: 30 U/L (ref 15–41)
Albumin: 3 g/dL — ABNORMAL LOW (ref 3.5–5.0)
Alkaline Phosphatase: 116 U/L (ref 38–126)
Anion gap: 7 (ref 5–15)
BUN: 10 mg/dL (ref 8–23)
CO2: 25 mmol/L (ref 22–32)
Calcium: 9.1 mg/dL (ref 8.9–10.3)
Chloride: 100 mmol/L (ref 98–111)
Creatinine, Ser: 1.1 mg/dL — ABNORMAL HIGH (ref 0.44–1.00)
GFR, Estimated: 45 mL/min — ABNORMAL LOW (ref >60)
Glucose, Bld: 94 mg/dL (ref 70–99)
Potassium: 4.4 mmol/L (ref 3.5–5.1)
Sodium: 132 mmol/L — ABNORMAL LOW (ref 135–145)
Total Bilirubin: 0.9 mg/dL (ref 0.3–1.2)
Total Protein: 6.4 g/dL — ABNORMAL LOW (ref 6.5–8.1)

## 2021-06-10 LAB — MAGNESIUM: Magnesium: 2.4 mg/dL (ref 1.7–2.4)

## 2021-06-10 NOTE — Progress Notes (Signed)
PROGRESS NOTE   Subjective/Complaints:  No issues overnight, labs reviewed, patient having some issues with phlegm in throat.  Occasional cough  ROS: +constipation, denies nausea vomiting or chest pain, difficult to obtain history for reflux.  Objective:   No results found. Recent Labs    06/09/21 0540  WBC 4.4  HGB 12.0  HCT 37.4  PLT 406*     Recent Labs    06/09/21 0540 06/10/21 0516  NA 134* 132*  K 4.7 4.4  CL 101 100  CO2 26 25  GLUCOSE 90 94  BUN 11 10  CREATININE 1.12* 1.10*  CALCIUM 9.0 9.1       Intake/Output Summary (Last 24 hours) at 06/10/2021 0924 Last data filed at 06/10/2021 1610 Gross per 24 hour  Intake 760 ml  Output 1 ml  Net 759 ml          Physical Exam: Vital Signs Blood pressure 124/72, pulse 77, temperature 97.9 F (36.6 C), resp. rate 18, height 5\' 1"  (1.549 m), weight 53.5 kg, SpO2 100 %.  General: No acute distress Mood and affect are appropriate Heart: Regular rate and rhythm no rubs murmurs or extra sounds Lungs: Clear to auscultation, breathing unlabored, no rales or wheezes Abdomen: Positive bowel sounds, soft nontender to palpation, nondistended Extremities: No clubbing, cyanosis, or edema Skin: No evidence of breakdown, no evidence of rash   Neuro: Alert HOH Moving all extremities.  4/5 bilateral biceps triceps deltoid, hip flexor knee extensor ankle dorsiflexor.   Assessment/Plan: 1. Functional deficits which require 3+ hours per day of interdisciplinary therapy in a comprehensive inpatient rehab setting. Physiatrist is providing close team supervision and 24 hour management of active medical problems listed below. Physiatrist and rehab team continue to assess barriers to discharge/monitor patient progress toward functional and medical goals  Care Tool:  Bathing  Bathing activity did not occur: Safety/medical concerns Body parts bathed by patient:  Right arm, Chest, Abdomen, Face, Front perineal area   Body parts bathed by helper: Left arm, Buttocks, Right upper leg, Left upper leg, Left lower leg, Right lower leg     Bathing assist Assist Level: Maximal Assistance - Patient 24 - 49%     Upper Body Dressing/Undressing Upper body dressing   What is the patient wearing?: Button up shirt, Dress, Pull over shirt (turtleneck and housecoat)    Upper body assist Assist Level: Maximal Assistance - Patient 25 - 49%    Lower Body Dressing/Undressing Lower body dressing      What is the patient wearing?: Incontinence brief     Lower body assist Assist for lower body dressing: Dependent - Patient 0%     Toileting Toileting    Toileting assist Assist for toileting: 2 Helpers (per most recent staff report)     Transfers Chair/bed transfer  Transfers assist     Chair/bed transfer assist level: Minimal Assistance - Patient > 75% Chair/bed transfer assistive device:   Ambulation assist      Assist level: 2 helpers Assistive device: Walker-rolling Max distance: 1   Walk 10 feet activity   Assist  Walk 10 feet activity did not occur: Safety/medical concerns  Walk 50 feet activity   Assist Walk 50 feet with 2 turns activity did not occur: Safety/medical concerns         Walk 150 feet activity   Assist Walk 150 feet activity did not occur: Safety/medical concerns         Walk 10 feet on uneven surface  activity   Assist Walk 10 feet on uneven surfaces activity did not occur: Safety/medical concerns         Wheelchair     Assist Is the patient using a wheelchair?: Yes Type of Wheelchair: Manual    Wheelchair assist level: Total Assistance - Patient < 25% Max wheelchair distance: 150    Wheelchair 50 feet with 2 turns activity    Assist        Assist Level: Total Assistance - Patient < 25%   Wheelchair 150 feet activity      Assist      Assist Level: Total Assistance - Patient < 25%   Blood pressure 124/72, pulse 77, temperature 97.9 F (36.6 C), resp. rate 18, height 5\' 1"  (1.549 m), weight 53.5 kg, SpO2 100 %.  Medical Problem List and Plan: 1.  Left MCA CVA  Continue CIR, team conf in am   -B/C vitamin complex started for stroke recovery 2.  Impaired mobility: Continue Lovenox             -antiplatelet therapy: DAPT X 3 months-->to follow up with neurology for further input.  3. Diffuse arthritis: Continue Tylenol #3 1/2 tab at bedtime daily. Scheduled voltaren gel 4 times per day 4. Mood: LCSW to follow for evaluation and support.              -antipsychotic agents: N/A 5. Neuropsych: This patient is not fully capable of making decisions on her own behalf. 6. Small sacral opening: Pressure relief measures.  --Turn every 2 hours while in bed.              --foam dressing for protection. -schedule tylenol with breakfast and lunch as well. Check LFTs tomorrow morning.  7. Fluids/Electrolytes/Nutrition: Monitor I/Os.  8. A fib: Monitor HR TID--continue Cardizem 30 mg TID. Supplement IV magnesium for Mag <2  Rate controlled on 11/19 9. Chronic dysphagia: Will continue home diet of ensures with pureed food.    D1 thins 10. Hypokalemia: Resolved 11. Chronic constipation:  scheduled daily 52mL milk of magnesia. Suppository today, check magnesium level tomorrow morning.  12. Hypertension: resolved with magnesium supplementation, continue to monitor TID.  Vitals:   06/09/21 1945 06/10/21 0533  BP: (!) 144/71 124/72  Pulse: 78 77  Resp: 17 18  Temp: 98.3 F (36.8 C) 97.9 F (36.6 C)  SpO2: 100% 100%  Good control 11/22  13. Suboptimal vitamin D: start on 1,00U daily supplement 14. Sacral pain: offload q2H to prevent pressure injury. Will try to get egg crate foam 15. Shortness of breath: EKG with aflutter.   16.  Hyponatremia  Sodium 132 on 11/14, continue to monitor. 31.  "Phlegm in  throat"  lung exam ok, check CXR, may be reflux vs dysphagia due to CVA, trial guafenesin 18. AKI: placed nursing order to encourage  6-8 glasses of water per day, repeat creatinine tomorrow morning.   LOS: 13 days A FACE TO FACE EVALUATION WAS PERFORMED  Charlett Blake 06/10/2021, 9:24 AM

## 2021-06-10 NOTE — Plan of Care (Signed)
  Problem: RH Swallowing Goal: LTG Patient will consume least restrictive diet using compensatory strategies with assistance (SLP) Description: LTG:  Patient will consume least restrictive diet using compensatory strategies with assistance (SLP) Outcome: Completed/Met   Problem: RH Expression Communication Goal: LTG Patient will express needs/wants via multi-modal(SLP) Description: LTG:  Patient will express needs/wants via multi-modal communication (gestures/written, etc) with cues (SLP) Outcome: Completed/Met Goal: LTG Patient will verbally express basic/complex needs(SLP) Description: LTG:  Patient will verbally express basic/complex needs, wants or ideas with cues  (SLP) Outcome: Completed/Met Goal: LTG Patient will increase speech intelligibility (SLP) Description: LTG: Patient will increase speech intelligibility at word/phrase/conversation level with cues, % of the time (SLP) Outcome: Completed/Met   Problem: RH Awareness Goal: LTG: Patient will demonstrate awareness during functional activites type of (SLP) Description: LTG: Patient will demonstrate awareness during functional activites type of (SLP) Outcome: Completed/Met

## 2021-06-10 NOTE — Plan of Care (Signed)
DC goal Problem: RH Wheelchair Mobility Goal: LTG Patient will propel w/c in home environment (PT) Description: LTG: Patient will propel wheelchair in home environment, # of feet with assistance (PT). Outcome: Not Applicable Note: Pt will have 24/7 assist and caregivers will provide transportation in w/c

## 2021-06-10 NOTE — Progress Notes (Signed)
Physical Therapy Session Note  Patient Details  Name: Sherry [name] MRN: [id number] Date of Birth: May 29, 1920  Today's Date: 06/10/2021 PT Individual Time: 1015-1057 PT Individual Time Calculation (min): 42 min   Short Term Goals: Week 2:  PT Short Term Goal 1 (Week 2): STG = LTG due to ELOS  Skilled Therapeutic Interventions/Progress Updates:     Pt supine in bed at start of session. Dtr at bedside. Lengthy discussion regarding DC planning, home safety, f/u therapies, pt's current mobility status, PT goals, general precautions and fall prevention strategies, etc. All questions/concerns addressed. Pt completed supine to long sitting with supervision with HOB slightly elevated. Donned night-gown with totalA for time. Supine<>sit with minA and minA needed for forward scooting to EOB. Completes sit<>stand transfer with modA to RW and then stand<>pivot transfers to w/c with minA and RW. She then ambulates ~9ft with minA and RW with +2 assist for w/c follow for safety. VC for increasing R step length and keeping body within walker frame. Dtr pleased to observe her mobility level and feels more confident/comfortable with taking her home tomorrow. Transported to day room rehab gym for time in w/c. Completed sit<>stand to high/low table with modA. She required minA for static standing with BUE support on table while she was instructed to organize #1-#6 in ascending order and then in descending order. She required max verbal cues for sequencing and accuracy with task. She used her paretic RUE to assist in manipulating paper #'s in order. She tolerated standing for 5 minutes while doing this task. Pt transported back to her room where she remained seated in w/c with all needs in reach and made comfortable. Dtr at bedside.   Therapy Documentation Precautions:  Precautions Precautions: Fall Precaution Comments: Hard of hearing, sacral pressure injury, R hemi, arthritic Restrictions Weight Bearing  Restrictions: No General:    Therapy/Group: Individual Therapy  Banessa Mao P Brylan Dec 06/10/2021, 7:25 AM

## 2021-06-10 NOTE — Progress Notes (Signed)
Patient and family refused bath tonight when approached by nurse tech and nurse. Patient states she would prefer her bath in the morning. Family present at bedside on the phone with another family member and requests patient get bath in the morning because other family member took all of patient's clothing home today. Patient to d/c 06/11/21. Will report to oncoming nurse.

## 2021-06-10 NOTE — Progress Notes (Signed)
Speech Language Pathology Discharge Summary  Patient Details  Name: Sherry [name] MRN: [id number] Date of Birth: Feb 13, 1920  Today's Date: 06/10/2021 SLP Individual Time: 1100-1130 SLP Individual Time Calculation (min): 30 min  Skilled Therapeutic Interventions:  Skilled ST treatment focused on communication goals. SLP facilitated session by providing min A verbal cues to maintain speech intelligiblity at sentence and conversation level. Pt with one occurrence of dysfluent speech production (initial consonant repetition) however independently repaired by pausing and re-starting. SLP facilitated generative naming task through naming items within common categories. Patient completed with sup A and contributed to basic conversation with minimal observed word finding difficulty. Patient was left in chair with alarm activated and immediate needs within reach at end of session.  Patient has met 5 of 5 long term goals.  Patient to discharge at overall Mod;Min level.   Reasons goals not met: N/A   Clinical Impression/Discharge Summary: Patient has made functional progress and has met 5 of 5 long-term goals this admission due to improved speech intelligibility, as well as maintained swallow function for tolerance of current diet. Patient is currently an overall min-to-mod A for implementing speech intelligibility strategies which enhances her ability to communicate her functional needs. As per family report, patient was on a pureed diet, thin liquids prior to admission and she is currently tolerating this diet without overt s/s aspiration or penetration. Patient and family education is complete and patient to discharge home with 24 hour supervision. Patient's care partner is independent to provide the necessary physical and cognitive assistance at discharge. Follow up skilled services are not recommended at this time as patient is near/at cognitive and swallowing baseline.   Care Partner:  Caregiver Able  to Provide Assistance: Yes  Type of Caregiver Assistance: Physical;Cognitive  Recommendation:  24 hour supervision/assistance  Rationale for SLP Follow Up: Other (comment) (Follow up SLP services are not recommended at this time)   Equipment: N/A   Reasons for discharge: Treatment goals met;Discharged from hospital   Patient/Family Agrees with Progress Made and Goals Achieved: Yes    Patty Sermons 06/10/2021, 7:44 AM

## 2021-06-10 NOTE — Progress Notes (Signed)
Occupational Therapy Discharge Summary  Patient Details  Name: Sherry [name] MRN: [id number] Date of Birth: [birth year]  Today's Date: 06/10/2021 OT Individual Time: 1330-1355 OT Individual Time Calculation (min): 25 min    Patient has met 11 of 11 long term goals due to improved activity tolerance, improved balance, postural control, ability to compensate for deficits, and functional use of  RIGHT upper extremity.  Patient to discharge at overall Mod Assist level.  Patient's care partner is independent to provide the necessary physical and cognitive assistance at discharge.    Recommendation:  Patient will benefit from ongoing skilled OT services in home health setting to continue to advance functional skills in the area of BADL.  Equipment: No equipment provided  Reasons for discharge: treatment goals met and discharge from hospital  Patient/family agrees with progress made and goals achieved: Yes  OT Discharge Precautions/Restrictions  Precautions Precautions: Fall Precaution Comments: Hard of hearing, sacral pressure injury, R hemi, arthritic Restrictions Weight Bearing Restrictions: No Pain Pain Assessment Pain Scale: 0-10 Pain Score: 0-No pain Pain Type: Acute pain;Chronic pain Pain Location: Sacrum Pain Descriptors / Indicators: Aching Pain Onset: Gradual Pain Intervention(s): Ambulation/increased activity;Repositioned;Emotional support ADL ADL Eating: Maximal assistance, Maximal cueing Where Assessed-Eating: Bed level Grooming: Moderate assistance Where Assessed-Grooming: Edge of bed Upper Body Bathing: Minimal assistance Where Assessed-Upper Body Bathing: Edge of bed Lower Body Bathing: Moderate assistance Where Assessed-Lower Body Bathing: Edge of bed Upper Body Dressing: Moderate assistance Where Assessed-Upper Body Dressing: Edge of bed Lower Body Dressing: Moderate assistance Where Assessed-Lower Body Dressing: Edge of bed Toileting: Maximal  assistance Where Assessed-Toileting: Bedside Commode Toilet Transfer: Minimal assistance Toilet Transfer Method: Stand pivot Science writer: Radiographer, therapeutic: Not assessed Social research officer, government: Not assessed Vision Baseline Vision/History: 0 No visual deficits Vision Assessment?: No apparent visual deficits Perception  Perception: Within Functional Limits Praxis Praxis: Impaired Praxis Impairment Details: Motor planning;Initiation Cognition Overall Cognitive Status: History of cognitive impairments - at baseline Arousal/Alertness: Awake/alert Year: 2022 Month: November Day of Week: Incorrect Attention: Focused;Sustained Focused Attention: Appears intact Sustained Attention: Impaired Memory: Impaired Memory Impairment: Storage deficit;Retrieval deficit Immediate Memory Recall:  (unable to recall 3/3 words) Memory Recall Sock: Not able to recall Memory Recall Blue: Not able to recall Memory Recall Bed: Not able to recall Awareness: Impaired Awareness Impairment: Emergent impairment Problem Solving: Impaired Problem Solving Impairment: Functional basic;Verbal basic Safety/Judgment: Impaired Sensation Sensation Light Touch: Appears Intact Hot/Cold: Appears Intact Proprioception: Appears Intact Stereognosis: Not tested Coordination Gross Motor Movements are Fluid and Coordinated: No Fine Motor Movements are Fluid and Coordinated: No Coordination and Movement Description: Impacted by global deconditioning/weakness as well as significant general arthritis in all major joints Motor  Motor Motor: Hemiplegia;Abnormal postural alignment and control Motor - Discharge Observations: generalized weakness, mild R hemi Mobility  Bed Mobility Bed Mobility: Rolling Right;Supine to Sit;Sit to Supine;Rolling Left;Scooting to [facility] Rolling Right: Minimal Assistance - Patient > 75% Rolling Left: Moderate Assistance - Patient 50-74% Supine to Sit: Minimal  Assistance - Patient > 75% Sit to Supine: Total Assistance - Patient < 25% Scooting to HOB: Minimal Assistance - Patient > 75% Transfers Sit to Stand: Moderate Assistance - Patient 50-74% Stand to Sit: Moderate Assistance - Patient 50-74%  Trunk/Postural Assessment  Cervical Assessment Cervical Assessment: Exceptions to [facility] (forward head) Thoracic Assessment Thoracic Assessment: Exceptions to [facility] (kyphotic) Lumbar Assessment Lumbar Assessment: Exceptions to [facility] (posterior pelvic tilt) Postural Control Postural Control: Deficits on evaluation Righting Reactions: delayed and inadequate Protective Responses: delayed and  inadequate Postural Limitations: moderate posterior bias  Balance Balance Balance Assessed: Yes Static Sitting Balance Static Sitting - Balance Support: Feet supported Static Sitting - Level of Assistance: 5: Stand by assistance Dynamic Sitting Balance Dynamic Sitting - Balance Support: During functional activity Dynamic Sitting - Level of Assistance: 5: Stand by assistance Static Standing Balance Static Standing - Balance Support: During functional activity;Bilateral upper extremity supported Static Standing - Level of Assistance: 4: Min assist Dynamic Standing Balance Dynamic Standing - Balance Support: Bilateral upper extremity supported;During functional activity Dynamic Standing - Level of Assistance: 3: Mod assist Extremity/Trunk Assessment RUE Assessment Passive Range of Motion (PROM) Comments: shoulder flexion 0-130 degrees approximately, limitations in full elbow extension as well as digit flexion secondary to arthritic changes in the fingers Active Range of Motion (AROM) Comments: Brunnstrum stage IV in the arm and hand. LUE Assessment Active Range of Motion (AROM) Comments: shoulder flexion 0-100 degrees, elbow flexion AROM WFLs, digit flexion limited secondary to arthritic changes in the digits. General Strength Comments: 3+/5 throughout   Ezekiel Slocumb 06/10/2021, 2:03 PM

## 2021-06-10 NOTE — Progress Notes (Signed)
Inpatient Rehabilitation Discharge Medication Review by a Pharmacist  A complete drug regimen review was completed for this patient to identify any potential clinically significant medication issues.  High Risk Drug Classes Is patient taking? Indication by Medication  Antipsychotic No   Anticoagulant No   Antibiotic No   Opioid No   Antiplatelet Yes Aspirin, Plavix for CVA  Hypoglycemics/insulin No   Vasoactive Medication Yes Diltiazem for BP, Afib  Chemotherapy No   Other No      Type of Medication Issue Identified Description of Issue Recommendation(s)  Drug Interaction(s) (clinically significant)     Duplicate Therapy     Allergy     No Medication Administration End Date     Incorrect Dose     Additional Drug Therapy Needed     Significant med changes from prior encounter (inform family/care partners about these prior to discharge).    Other       Clinically significant medication issues were identified that warrant physician communication and completion of prescribed/recommended actions by midnight of the next day:  No  Pharmacist comments: None  Time spent performing this drug regimen review (minutes):  20 minutes   Elwin Sleight 06/10/2021 9:31 AM

## 2021-06-11 MED ORDER — VITAMIN D3 25 MCG PO TABS: 1000.0000 [IU] | ORAL_TABLET | Freq: Every day | ORAL | 0 refills | Status: DC

## 2021-06-11 MED ORDER — MAGNESIUM HYDROXIDE 400 MG/5ML PO SUSP: 30.0000 mL | Freq: Every day | ORAL | 0 refills | Status: DC

## 2021-06-11 MED ORDER — ASPIRIN 81 MG PO CHEW: 81.0000 mg | CHEWABLE_TABLET | Freq: Every day | ORAL | 1 refills | Status: DC

## 2021-06-11 MED ORDER — DILTIAZEM HCL 30 MG PO TABS: 30.0000 mg | ORAL_TABLET | Freq: Four times a day (QID) | ORAL | 0 refills | Status: DC

## 2021-06-11 MED ORDER — DOCUSATE SODIUM 100 MG PO CAPS: 100.0000 mg | ORAL_CAPSULE | Freq: Two times a day (BID) | ORAL | 0 refills | Status: DC

## 2021-06-11 MED ORDER — ACETAMINOPHEN 325 MG PO TABS: 650.0000 mg | ORAL_TABLET | Freq: Two times a day (BID) | ORAL | Status: DC

## 2021-06-11 MED ORDER — CLOPIDOGREL BISULFATE 75 MG PO TABS
75.0000 mg | ORAL_TABLET | Freq: Every day | ORAL | 0 refills | Status: DC
Start: 2021-06-11 — End: 2021-09-30

## 2021-06-11 MED ORDER — PANTOPRAZOLE SODIUM 40 MG PO TBEC: 40.0000 mg | DELAYED_RELEASE_TABLET | Freq: Every day | ORAL | 0 refills | Status: DC

## 2021-06-11 MED ORDER — B COMPLEX-C PO TABS: 1.0000 | ORAL_TABLET | Freq: Every day | ORAL | 0 refills | Status: DC

## 2021-06-11 NOTE — Discharge Summary (Signed)
Physician Discharge Summary  Patient ID: Sherry [name] MRN: [id number] DOB/AGE: October 19, 1919 87 y.o.  Admit date: 05/28/2021 Discharge date: 06/11/2021  Discharge Diagnoses:  Principal Problem:   Acute ischemic left middle cerebral artery (MCA) stroke (HCC) Active Problems:   Atrial fibrillation (HCC)   Slow transit constipation   Sore throat   Hyponatremia   Essential hypertension   Generalized arthritis   Discharged Condition: stable  Significant Diagnostic Studies: DG Chest 2 View  Result Date: 06/03/2021 CLINICAL DATA:  Shortness of breath, history of prior stroke EXAM: CHEST - 2 VIEW COMPARISON:  12/10/2020 FINDINGS: Cardiac shadow is enlarged but stable. Aortic calcifications and tortuosity are again seen and stable. The lungs are clear. No sizable effusion is noted. Degenerative changes of the shoulder joints are seen. Degenerative changes of the thoracic spine is noted as well. IMPRESSION: Chronic changes without acute abnormality. Electronically Signed   By: [name] M.D.   On: 06/03/2021 19:31    DG CHEST PORT 1 VIEW  Result Date: 06/10/2021 CLINICAL DATA:  Cough EXAM: PORTABLE CHEST 1 VIEW COMPARISON:  Previous studies including the examination of 06/03/2021 FINDINGS: Transverse diameter of heart is increased. Thoracic aorta is tortuous and ectatic. Apparent shift of mediastinum to the right may be due to rotation. There are no signs of pulmonary edema or new focal infiltrates. There are numerous diverticula in the colon. Severe degenerative changes are noted in both shoulders. IMPRESSION: Cardiomegaly. There are no signs of pulmonary edema or focal pulmonary consolidation. Other findings as described in the body of the report. Electronically Signed   By: Elmer Picker M.D.   On: 06/10/2021 09:56    Labs:  Basic Metabolic Panel: Recent Labs  Lab 06/09/21 0540 06/10/21 0516  NA 134* 132*  K 4.7 4.4  CL 101 100  CO2 26 25  GLUCOSE 90 94  BUN 11 10   CREATININE 1.12* 1.10*  CALCIUM 9.0 9.1  MG  --  2.4    CBC: Recent Labs  Lab 06/09/21 0540  WBC 4.4  HGB 12.0  HCT 37.4  MCV 85.8  PLT 406*    CBG: No results for input(s): GLUCAP in the last 168 hours.  Brief HPI:   Sherry [name] is a 87 y.o. female with history of HTN, A. fib, advanced OA right knee, who was admitted on 05/25/2019 with onset of right-sided weakness with inability to speak and right visual field deficits.  CTA head was negative for LVO but showed left ICA stenosis with radiographic string sign, 65 to 70% right ICA stenosis and heavily calcified bilateral ICA siphons.  She received TN K and follow-up CT was negative for bleed.  She did develop APP a flutter on 11/07 and Cardizem was resumed to monitor heart rate.  Dr. Leonie Man felt that stroke was embolic from A. fib/a flutter and recommended aspirin Plavix x3 months.  Her mentation was improving with improvement in verbal output.  She continued to be limited by weakness with posterior bias on standing attempts, had delay in processing with anxiety and weakness affecting mobility and ADLs.  CIR was recommended to functional decline.   Hospital Course: Sherry [name] was admitted to rehab 05/28/2021 for inpatient therapies to consist of PT, ST and OT at least three hours five days a week. Past admission physiatrist, therapy team and rehab RN have worked together to provide customized collaborative inpatient rehab.  Her blood pressures and heart rate were monitored on TID basis and have been stable.  Milk of magnesia was added to help manage chronic constipation.  She was started on 1000 units vitamin D supplements due to borderline vitamin D levels.  Hypokalemia has resolved with brief supplementation.  Serial check of be met shows hyponatremia to be stable.    She was maintained on DAPT and is tolerating this without side effects.  Follow-up CBC shows H&H and platelets to be stable.  She was maintained on her home diet of  pured food with Ensure supplements.  Staff has assisted with feeding to help maintain adequate intake.  She did report some issues with phlegm and cough prior to discharge.  Chest x-ray done was negative for acute changes.  Question symptoms due to reflux versus dysphagia and guaifenesin was added to manage symptoms.  Tylenol 3 was discontinued and pain has been managed with scheduled Tylenol 650 mg twice daily.  Her endurance and energy levels have improved and she is currently progressed to min to mod assist.  She will continue receive follow-up home therapies after discharge.    Rehab course: During patient's stay in rehab weekly team conferences were held to monitor patient's progress, set goals and discuss barriers to discharge. At admission, patient required total assist with basic ADL tasks and with mobility. She exhibited expressive greater than receptive aphasia and speech was 50 to 75% intelligible at phrase sentence level. She  has had improvement in activity tolerance, balance, postural control as well as ability to compensate for deficits. She has had improvement in functional use RUE and RLE  as well as improvement in awareness.  She is able to complete ADL tasks with mod assist. She requires min assist with transfers and is able to ambulate 15 feet with close to min assist and use of rolling walker. She requires min to mod assist for speech strategies and is tolerating current diet without any signs or symptoms of aspiration or penetration.  Family education was completed.  Disposition: Home.  Diet: Pureed diet, thin liquid  Special Instructions:  Discharge Instructions     Ambulatory referral to Neurology   Complete by: As directed    An appointment is requested in approximately: 2 weeks for stroke follow up   Ambulatory referral to Physical Medicine Rehab   Complete by: As directed    Hospital follow up      Allergies as of 06/11/2021   No Known Allergies      Medication  List     TAKE these medications    acetaminophen 325 MG tablet Commonly known as: TYLENOL Take 2 tablets (650 mg total) by mouth 2 (two) times daily with breakfast and lunch. What changed:  when to take this reasons to take this   aspirin 81 MG chewable tablet Chew 1 tablet (81 mg total) by mouth daily. Notes to patient: Need to purchase this over the counter   B-complex with vitamin C tablet Take 1 tablet by mouth daily.   clopidogrel 75 MG tablet Commonly known as: PLAVIX Take 1 tablet (75 mg total) by mouth daily.   diltiazem 30 MG tablet Commonly known as: CARDIZEM Take 1 tablet (30 mg total) by mouth every 6 (six) hours.   docusate sodium 100 MG capsule Commonly known as: COLACE Take 1 capsule (100 mg total) by mouth 2 (two) times daily.   magnesium hydroxide 400 MG/5ML suspension Commonly known as: MILK OF MAGNESIA Take 30 mLs by mouth daily. Notes to patient: Daily or as needed for constipation. Can use miralax daily in place  of this if you prefer. Both medications can be purchased over the counter   pantoprazole 40 MG tablet Commonly known as: PROTONIX Take 1 tablet (40 mg total) by mouth at bedtime.   Vitamin D3 25 MCG tablet Commonly known as: Vitamin D Take 1 tablet (1,000 Units total) by mouth daily.        Follow-up Information     Raulkar, Clide Deutscher, MD Follow up.   Specialty: Physical Medicine and Rehabilitation Why: 08/05/21 please arrive at 1:00pm for 1:20pm follow-up appointment, thank you! Contact information: [address] [place] [address] [phone]         [facility]. Call.   Why: for appointment if you have not heard from them in a week. Contact information: [address]     [address] [place] [address] [phone]        Dixie Dials, MD. Call.   Specialty: Cardiology Why: for post hospital follow up Contact information: [place]  [address] [phone]                 Signed: Bary Leriche 06/15/2021, 11:40 PM

## 2021-06-11 NOTE — Progress Notes (Signed)
PROGRESS NOTE   Subjective/Complaints:  CXR no signs of pneumonia , still feels "phlegm in throat"  ROS: +constipation, denies nausea vomiting or chest pain, difficult to obtain history for reflux.  Objective:   DG CHEST PORT 1 VIEW  Result Date: 06/10/2021 CLINICAL DATA:  Cough EXAM: PORTABLE CHEST 1 VIEW COMPARISON:  Previous studies including the examination of 06/03/2021 FINDINGS: Transverse diameter of heart is increased. Thoracic aorta is tortuous and ectatic. Apparent shift of mediastinum to the right may be due to rotation. There are no signs of pulmonary edema or new focal infiltrates. There are numerous diverticula in the colon. Severe degenerative changes are noted in both shoulders. IMPRESSION: Cardiomegaly. There are no signs of pulmonary edema or focal pulmonary consolidation. Other findings as described in the body of the report. Electronically Signed   By: Ernie Avena M.D.   On: 06/10/2021 09:56   Recent Labs    06/09/21 0540  WBC 4.4  HGB 12.0  HCT 37.4  PLT 406*     Recent Labs    06/09/21 0540 06/10/21 0516  NA 134* 132*  K 4.7 4.4  CL 101 100  CO2 26 25  GLUCOSE 90 94  BUN 11 10  CREATININE 1.12* 1.10*  CALCIUM 9.0 9.1       Intake/Output Summary (Last 24 hours) at 06/11/2021 0908 Last data filed at 06/11/2021 0700 Gross per 24 hour  Intake 720 ml  Output --  Net 720 ml          Physical Exam: Vital Signs Blood pressure 139/73, pulse 80, temperature (!) 97.5 F (36.4 C), temperature source Oral, resp. rate 18, height 5\' 1"  (1.549 m), weight 53.5 kg, SpO2 100 %.  General: No acute distress Mood and affect are appropriate Heart: Regular rate and rhythm no rubs murmurs or extra sounds Lungs: Clear to auscultation, breathing unlabored, no rales or wheezes Abdomen: Positive bowel sounds, soft nontender to palpation, nondistended Extremities: No clubbing, cyanosis, or  edema Skin: No evidence of breakdown, no evidence of rash Neuro: Alert HOH Moving all extremities.  4/5 bilateral biceps triceps deltoid, hip flexor knee extensor ankle dorsiflexor.   Assessment/Plan: 1. Functional deficits Stable for d/c Stable for D/C today F/u PCP in 3-4 weeks F/u PM&R 2 weeks See D/C summary See D/C instructions   Care Tool:  Bathing  Bathing activity did not occur: Safety/medical concerns Body parts bathed by patient: Right arm, Chest, Abdomen, Face, Front perineal area   Body parts bathed by helper: Left arm, Buttocks, Right upper leg, Left upper leg, Left lower leg, Right lower leg     Bathing assist Assist Level: Moderate Assistance - Patient 50 - 74%     Upper Body Dressing/Undressing Upper body dressing   What is the patient wearing?: Button up shirt, Dress, Pull over shirt (turtleneck and housecoat)    Upper body assist Assist Level: Moderate Assistance - Patient 50 - 74%    Lower Body Dressing/Undressing Lower body dressing      What is the patient wearing?: Incontinence brief     Lower body assist Assist for lower body dressing: Moderate Assistance - Patient 50 - 74%  Toileting Toileting    Toileting assist Assist for toileting: Total Assistance - Patient < 25%     Transfers Chair/bed transfer  Transfers assist     Chair/bed transfer assist level: Minimal Assistance - Patient > 75% Chair/bed transfer assistive device: Geologist, engineering   Ambulation assist      Assist level: 2 helpers Assistive device: Walker-rolling Max distance: 66ft   Walk 10 feet activity   Assist  Walk 10 feet activity did not occur: Safety/medical concerns  Assist level: 2 helpers Assistive device: Walker-rolling   Walk 50 feet activity   Assist Walk 50 feet with 2 turns activity did not occur: Safety/medical concerns         Walk 150 feet activity   Assist Walk 150 feet activity did not occur: Safety/medical  concerns         Walk 10 feet on uneven surface  activity   Assist Walk 10 feet on uneven surfaces activity did not occur: Safety/medical concerns         Wheelchair     Assist Is the patient using a wheelchair?: Yes Type of Wheelchair: Manual    Wheelchair assist level: Total Assistance - Patient < 25% Max wheelchair distance: 150    Wheelchair 50 feet with 2 turns activity    Assist        Assist Level: Total Assistance - Patient < 25%   Wheelchair 150 feet activity     Assist      Assist Level: Total Assistance - Patient < 25%   Blood pressure 139/73, pulse 80, temperature (!) 97.5 F (36.4 C), temperature source Oral, resp. rate 18, height 5\' 1"  (1.549 m), weight 53.5 kg, SpO2 100 %.  Medical Problem List and Plan: 1.  Left MCA CVA  d/c home today   -B/C vitamin complex started for stroke recovery 2.  Impaired mobility: Continue Lovenox             -antiplatelet therapy: DAPT X 3 months-->to follow up with neurology for further input.  3. Diffuse arthritis: Continue Tylenol #3 1/2 tab at bedtime daily. Scheduled voltaren gel 4 times per day 4. Mood: LCSW to follow for evaluation and support.              -antipsychotic agents: N/A 5. Neuropsych: This patient is not fully capable of making decisions on her own behalf. 6. Small sacral opening: Pressure relief measures.  --Turn every 2 hours while in bed.              --foam dressing for protection. -schedule tylenol with breakfast and lunch as well. Check LFTs tomorrow morning.  7. Fluids/Electrolytes/Nutrition: Monitor I/Os.  8. A fib: Monitor HR TID--continue Cardizem 30 mg TID. Supplement IV magnesium for Mag <2  Rate controlled on 11/19 9. Chronic dysphagia: Will continue home diet of ensures with pureed food.    D1 thins 10. Hypokalemia: Resolved 11. Chronic constipation:  scheduled daily 74mL milk of magnesia. Suppository today, check magnesium level tomorrow morning.  12.  Hypertension: resolved with magnesium supplementation, continue to monitor TID.  Vitals:   06/10/21 1953 06/11/21 0411  BP: 113/68 139/73  Pulse: 70 80  Resp: 20 18  Temp: 97.7 F (36.5 C) (!) 97.5 F (36.4 C)  SpO2: 100% 100%  Good control 11/23  13. Suboptimal vitamin D: start on 1,00U daily supplement 14. Sacral pain: offload q2H to prevent pressure injury. Will try to get egg crate foam 15. Shortness of breath: EKG  with aflutter.   16.  Hyponatremia  Sodium 132 on 11/14, continue to monitor. 17.  "Phlegm in throat"  lung exam ok, check CXR, may be reflux vs dysphagia due to CVA, trial guafenesin, pt daughter states this started prior to CVA, f/u with PCP on this  May try OTC pepcid qhs  18. AKI: placed nursing order to encourage  6-8 glasses of water per day, repeat creatinine tomorrow morning.   LOS: 14 days A FACE TO FACE EVALUATION WAS PERFORMED  Erick Colace 06/11/2021, 9:08 AM

## 2021-06-11 NOTE — Progress Notes (Signed)
INPATIENT REHABILITATION DISCHARGE NOTE   Discharge instructions by: Elita Quick PA-C   Verbalized understanding: yes   Skin care/Wound care healing? No wounds   Pain: no pain   IV's: no current IV's   Tubes/Drains: none   O2:room air   Safety instructions: given   Patient belongings: given to family   Discharged HU:TMLY   Discharged via:w/c   Notes:

## 2021-06-16 ENCOUNTER — Telehealth: Payer: Self-pay

## 2021-06-16 DIAGNOSIS — R1310 Dysphagia, unspecified: Secondary | ICD-10-CM

## 2021-06-16 NOTE — Telephone Encounter (Signed)
Unk Lightning, PA  [place], [place], [place] Can you schedule patient for a modified barium swallow.  Thanks.  JL L.        Previous Messages   ----- Message -----  From: Wendall Stade  Sent: 06/16/2021  11:18 AM EST  To: Gabriela Eves, [place]   Good Morning   In order for this patient to get scheduled for a ST evaluation, she would first need to have a Modified Barium Swallow Study (MBSS) done because of her dx of dysphagia. After the patient has been has had the MBSS done she can call the office to get scheduled for a ST evaluation.

## 2021-06-16 NOTE — Telephone Encounter (Signed)
MBSS order in epic. Left a detailed voice message for speech pathology to return call to schedule. I provided them with my direct contact number.

## 2021-06-17 ENCOUNTER — Other Ambulatory Visit (HOSPITAL_COMMUNITY): Payer: Self-pay

## 2021-06-17 DIAGNOSIS — R059 Cough, unspecified: Secondary | ICD-10-CM

## 2021-06-17 DIAGNOSIS — R1310 Dysphagia, unspecified: Secondary | ICD-10-CM

## 2021-06-17 NOTE — Telephone Encounter (Signed)
Called and spoke with Sherry [name] at speech pathology. She informed us that pt is scheduled for her study on 06/26/21 and she has relayed the appt information to pt's family.

## 2021-06-19 ENCOUNTER — Telehealth: Payer: Self-pay | Admitting: *Deleted

## 2021-06-19 NOTE — Telephone Encounter (Signed)
Theora Gianotti ST [facility] called for POC of 1 wk4.  Approval given.

## 2021-06-26 ENCOUNTER — Ambulatory Visit (HOSPITAL_COMMUNITY): Payer: Medicare Other

## 2021-06-26 ENCOUNTER — Encounter (HOSPITAL_COMMUNITY): Payer: Medicare Other

## 2021-07-09 ENCOUNTER — Ambulatory Visit: Payer: Medicare Other | Admitting: Podiatry

## 2021-07-14 ENCOUNTER — Other Ambulatory Visit: Payer: Self-pay | Admitting: Physician Assistant

## 2021-07-17 NOTE — Progress Notes (Signed)
Reviewed and agree with documentation and assessment and plan. K. Veena Issachar Broady , MD   

## 2021-07-23 ENCOUNTER — Inpatient Hospital Stay: Payer: Medicare Other | Admitting: Adult Health

## 2021-07-23 ENCOUNTER — Encounter: Payer: Self-pay | Admitting: Adult Health

## 2021-08-05 ENCOUNTER — Other Ambulatory Visit: Payer: Self-pay

## 2021-08-05 ENCOUNTER — Encounter: Payer: 59 | Attending: Physical Medicine and Rehabilitation | Admitting: Physical Medicine and Rehabilitation

## 2021-08-05 DIAGNOSIS — K5909 Other constipation: Secondary | ICD-10-CM

## 2021-08-05 DIAGNOSIS — L84 Corns and callosities: Secondary | ICD-10-CM

## 2021-08-05 NOTE — Progress Notes (Signed)
° °  Subjective:    Patient ID: Sherry [name], female    DOB: [birth year], 88 y.o.   MRN: [id number]  HPI An audio/video tele-health visit is felt to be the most appropriate encounter for this patient at this time. This is a follow up tele-visit via phone. The patient is at home. MD is at office. Prior to scheduling this appointment, our staff discussed the limitations of evaluation and management by telemedicine and the availability of in-person appointments. The patient expressed understanding and agreed to proceed.   Sherry [name] is a 88 year old woman who presents for follow-up of constipation.  1) Constipation -she is taking stool softeners, magnesium, miralax -she was told to stop taking the milk of magnesia by her doctor.   2) Callus -has appointment with her podiatrist tomorrow.  3) MCA stroke -she has not been following with therapy as it was so cold   Review of Systems +constipation    Objective:   Physical Exam  Not performed as patient was seen via phone      Assessment & Plan:  1) Constipation -recommended resuming milk of magnesium  -discussed that magnesium is safe and healthy for the body.   2) Callus -continue to follow-up with podiatry.   3) MCA stroke -will restart her outpatient therapy when the weather is warmer.   6 minutes spent in discussion of her stroke, constipation, restarting milk of magneisum as needed, podiatry follow-up for callus

## 2021-08-06 ENCOUNTER — Other Ambulatory Visit: Payer: Self-pay

## 2021-08-06 ENCOUNTER — Ambulatory Visit (INDEPENDENT_AMBULATORY_CARE_PROVIDER_SITE_OTHER): Payer: 59 | Admitting: Podiatry

## 2021-08-06 DIAGNOSIS — M79676 Pain in unspecified toe(s): Secondary | ICD-10-CM | POA: Diagnosis not present

## 2021-08-06 DIAGNOSIS — B351 Tinea unguium: Secondary | ICD-10-CM | POA: Diagnosis not present

## 2021-08-06 NOTE — Progress Notes (Signed)
This patient presents to the office with chief complaint of long thick painful nails.  Patient says the nails are painful walking and wearing shoes.  This patient is unable to self treat.  This patient is unable to trim her nails since she is unable to reach her nails. She has history of CVA. She presents to the office for preventative foot care services.   She presents to the office with her daughter. And son.  She presents in a wheelchair..  General Appearance  Alert, conversant and in no acute stress.  Vascular  Dorsalis pedis and posterior tibial  pulses are weakly  palpable  bilaterally.  Capillary return is within normal limits  bilaterally. Temperature is within normal limits  bilaterally.  Neurologic  Senn-Weinstein monofilament wire test within normal limits  bilaterally. Muscle power within normal limits bilaterally.  Nails Thick disfigured discolored nails with subungual debris  from hallux to fifth toes bilaterally. No evidence of bacterial infection or drainage bilaterally.  Orthopedic  No limitations of motion  feet .  No crepitus or effusions noted.  No bony pathology or digital deformities noted.  HAV  B/L.  Hammer toes  B/L.  Skin  normotropic skin with no porokeratosis noted bilaterally.  No signs of infections or ulcers noted.     Onychomycosis  Nails  B/L.  Pain in right toes  Pain in left toes    Debridement of nails both feet followed trimming the nails with dremel tool.  Debride callus with  # 15 blade and dremel tool.   RTC  10 weeks    Helane Gunther DPM

## 2021-08-12 ENCOUNTER — Ambulatory Visit: Payer: 59 | Admitting: Podiatry

## 2021-08-28 ENCOUNTER — Other Ambulatory Visit: Payer: Self-pay

## 2021-08-28 NOTE — Patient Outreach (Signed)
[facility]) Care Management  08/28/2021  Ellesse Antenucci [facility] 1919-12-31 [id number]   Telephone outreach to patient to obtain mRS was successfully completed. MRS= 5  Vanice Sarah [facility] Care Management Assistant [phone]

## 2021-09-03 ENCOUNTER — Other Ambulatory Visit: Payer: Self-pay | Admitting: Physical Medicine and Rehabilitation

## 2021-09-29 ENCOUNTER — Other Ambulatory Visit: Payer: Self-pay | Admitting: Physical Medicine and Rehabilitation

## 2021-10-07 ENCOUNTER — Inpatient Hospital Stay (HOSPITAL_COMMUNITY): Payer: 59

## 2021-10-07 ENCOUNTER — Emergency Department (HOSPITAL_COMMUNITY): Payer: 59

## 2021-10-07 ENCOUNTER — Other Ambulatory Visit: Payer: Self-pay

## 2021-10-07 ENCOUNTER — Encounter (HOSPITAL_COMMUNITY): Payer: Self-pay | Admitting: Emergency Medicine

## 2021-10-07 ENCOUNTER — Inpatient Hospital Stay (HOSPITAL_COMMUNITY)
Admission: EM | Admit: 2021-10-07 | Discharge: 2021-10-17 | DRG: 100 | Disposition: A | Discharge status: Hospice | Payer: 59 | Attending: Cardiovascular Disease | Admitting: Cardiovascular Disease

## 2021-10-07 DIAGNOSIS — I4892 Unspecified atrial flutter: Secondary | ICD-10-CM | POA: Diagnosis present

## 2021-10-07 DIAGNOSIS — I350 Nonrheumatic aortic (valve) stenosis: Secondary | ICD-10-CM | POA: Diagnosis present

## 2021-10-07 DIAGNOSIS — I452 Bifascicular block: Secondary | ICD-10-CM | POA: Diagnosis present

## 2021-10-07 DIAGNOSIS — Z8249 Family history of ischemic heart disease and other diseases of the circulatory system: Secondary | ICD-10-CM

## 2021-10-07 DIAGNOSIS — Z79899 Other long term (current) drug therapy: Secondary | ICD-10-CM

## 2021-10-07 DIAGNOSIS — Z7902 Long term (current) use of antithrombotics/antiplatelets: Secondary | ICD-10-CM

## 2021-10-07 DIAGNOSIS — E46 Unspecified protein-calorie malnutrition: Secondary | ICD-10-CM | POA: Diagnosis present

## 2021-10-07 DIAGNOSIS — Z7189 Other specified counseling: Secondary | ICD-10-CM | POA: Diagnosis not present

## 2021-10-07 DIAGNOSIS — N182 Chronic kidney disease, stage 2 (mild): Secondary | ICD-10-CM | POA: Diagnosis present

## 2021-10-07 DIAGNOSIS — I480 Paroxysmal atrial fibrillation: Secondary | ICD-10-CM | POA: Diagnosis present

## 2021-10-07 DIAGNOSIS — E872 Acidosis, unspecified: Secondary | ICD-10-CM | POA: Diagnosis present

## 2021-10-07 DIAGNOSIS — G40101 Localization-related (focal) (partial) symptomatic epilepsy and epileptic syndromes with simple partial seizures, not intractable, with status epilepticus: Principal | ICD-10-CM | POA: Diagnosis present

## 2021-10-07 DIAGNOSIS — Z8673 Personal history of transient ischemic attack (TIA), and cerebral infarction without residual deficits: Secondary | ICD-10-CM | POA: Diagnosis not present

## 2021-10-07 DIAGNOSIS — I129 Hypertensive chronic kidney disease with stage 1 through stage 4 chronic kidney disease, or unspecified chronic kidney disease: Secondary | ICD-10-CM | POA: Diagnosis present

## 2021-10-07 DIAGNOSIS — Z87891 Personal history of nicotine dependence: Secondary | ICD-10-CM | POA: Diagnosis not present

## 2021-10-07 DIAGNOSIS — Z7982 Long term (current) use of aspirin: Secondary | ICD-10-CM | POA: Diagnosis not present

## 2021-10-07 DIAGNOSIS — E785 Hyperlipidemia, unspecified: Secondary | ICD-10-CM | POA: Diagnosis present

## 2021-10-07 DIAGNOSIS — M1990 Unspecified osteoarthritis, unspecified site: Secondary | ICD-10-CM | POA: Diagnosis present

## 2021-10-07 DIAGNOSIS — E8721 Acute metabolic acidosis: Secondary | ICD-10-CM | POA: Diagnosis present

## 2021-10-07 DIAGNOSIS — K219 Gastro-esophageal reflux disease without esophagitis: Secondary | ICD-10-CM | POA: Diagnosis present

## 2021-10-07 DIAGNOSIS — R569 Unspecified convulsions: Secondary | ICD-10-CM | POA: Diagnosis present

## 2021-10-07 DIAGNOSIS — Z515 Encounter for palliative care: Secondary | ICD-10-CM

## 2021-10-07 DIAGNOSIS — Z20822 Contact with and (suspected) exposure to covid-19: Secondary | ICD-10-CM | POA: Diagnosis present

## 2021-10-07 DIAGNOSIS — Z789 Other specified health status: Secondary | ICD-10-CM | POA: Diagnosis not present

## 2021-10-07 DIAGNOSIS — G9341 Metabolic encephalopathy: Secondary | ICD-10-CM | POA: Diagnosis not present

## 2021-10-07 DIAGNOSIS — Z7901 Long term (current) use of anticoagulants: Secondary | ICD-10-CM

## 2021-10-07 DIAGNOSIS — R4182 Altered mental status, unspecified: Secondary | ICD-10-CM

## 2021-10-07 DIAGNOSIS — G40901 Epilepsy, unspecified, not intractable, with status epilepticus: Secondary | ICD-10-CM | POA: Diagnosis not present

## 2021-10-07 DIAGNOSIS — Z66 Do not resuscitate: Secondary | ICD-10-CM | POA: Diagnosis present

## 2021-10-07 DIAGNOSIS — J9601 Acute respiratory failure with hypoxia: Secondary | ICD-10-CM | POA: Diagnosis present

## 2021-10-07 LAB — CBC WITH DIFFERENTIAL/PLATELET
Abs Immature Granulocytes: 0.01 10*3/uL (ref 0.00–0.07)
Basophils Absolute: 0 10*3/uL (ref 0.0–0.1)
Basophils Relative: 1 %
Eosinophils Absolute: 0.2 10*3/uL (ref 0.0–0.5)
Eosinophils Relative: 5 %
HCT: 41.8 % (ref 36.0–46.0)
Hemoglobin: 13 g/dL (ref 12.0–15.0)
Immature Granulocytes: 0 %
Lymphocytes Relative: 32 %
Lymphs Abs: 1.3 10*3/uL (ref 0.7–4.0)
MCH: 27.6 pg (ref 26.0–34.0)
MCHC: 31.1 g/dL (ref 30.0–36.0)
MCV: 88.7 fL (ref 80.0–100.0)
Monocytes Absolute: 0.4 10*3/uL (ref 0.1–1.0)
Monocytes Relative: 9 %
Neutro Abs: 2.1 10*3/uL (ref 1.7–7.7)
Neutrophils Relative %: 53 %
Platelets: 233 10*3/uL (ref 150–400)
RBC: 4.71 MIL/uL (ref 3.87–5.11)
RDW: 15.9 % — ABNORMAL HIGH (ref 11.5–15.5)
WBC: 3.9 10*3/uL — ABNORMAL LOW (ref 4.0–10.5)
nRBC: 0 % (ref 0.0–0.2)

## 2021-10-07 LAB — URINALYSIS, ROUTINE W REFLEX MICROSCOPIC
Bilirubin Urine: NEGATIVE
Glucose, UA: NEGATIVE mg/dL
Hgb urine dipstick: NEGATIVE
Ketones, ur: NEGATIVE mg/dL
Leukocytes,Ua: NEGATIVE
Nitrite: NEGATIVE
Protein, ur: NEGATIVE mg/dL
Specific Gravity, Urine: 1.012 (ref 1.005–1.030)
pH: 7 (ref 5.0–8.0)

## 2021-10-07 LAB — COMPREHENSIVE METABOLIC PANEL
ALT: 20 U/L (ref 0–44)
AST: 44 U/L — ABNORMAL HIGH (ref 15–41)
Albumin: 2.9 g/dL — ABNORMAL LOW (ref 3.5–5.0)
Alkaline Phosphatase: 106 U/L (ref 38–126)
Anion gap: 14 (ref 5–15)
BUN: 26 mg/dL — ABNORMAL HIGH (ref 8–23)
CO2: 21 mmol/L — ABNORMAL LOW (ref 22–32)
Calcium: 9 mg/dL (ref 8.9–10.3)
Chloride: 108 mmol/L (ref 98–111)
Creatinine, Ser: 1.37 mg/dL — ABNORMAL HIGH (ref 0.44–1.00)
GFR, Estimated: 34 mL/min — ABNORMAL LOW (ref >60)
Glucose, Bld: 97 mg/dL (ref 70–99)
Potassium: 3.9 mmol/L (ref 3.5–5.1)
Sodium: 143 mmol/L (ref 135–145)
Total Bilirubin: 0.7 mg/dL (ref 0.3–1.2)
Total Protein: 5.9 g/dL — ABNORMAL LOW (ref 6.5–8.1)

## 2021-10-07 LAB — I-STAT ARTERIAL BLOOD GAS, ED
Acid-base deficit: 2 mmol/L (ref 0.0–2.0)
Bicarbonate: 22.9 mmol/L (ref 20.0–28.0)
Calcium, Ion: 1.28 mmol/L (ref 1.15–1.40)
HCT: 40 % (ref 36.0–46.0)
Hemoglobin: 13.6 g/dL (ref 12.0–15.0)
O2 Saturation: 99 %
Potassium: 3.8 mmol/L (ref 3.5–5.1)
Sodium: 141 mmol/L (ref 135–145)
TCO2: 24 mmol/L (ref 22–32)
pCO2 arterial: 39.4 mmHg (ref 32–48)
pH, Arterial: 7.373 (ref 7.35–7.45)
pO2, Arterial: 161 mmHg — ABNORMAL HIGH (ref 83–108)

## 2021-10-07 LAB — RAPID URINE DRUG SCREEN, HOSP PERFORMED
Amphetamines: NOT DETECTED
Barbiturates: NOT DETECTED
Benzodiazepines: POSITIVE — AB
Cocaine: NOT DETECTED
Opiates: NOT DETECTED
Tetrahydrocannabinol: NOT DETECTED

## 2021-10-07 LAB — CBG MONITORING, ED: Glucose-Capillary: 103 mg/dL — ABNORMAL HIGH (ref 70–99)

## 2021-10-07 LAB — MAGNESIUM: Magnesium: 2 mg/dL (ref 1.7–2.4)

## 2021-10-07 LAB — PHOSPHORUS: Phosphorus: 3.8 mg/dL (ref 2.5–4.6)

## 2021-10-07 LAB — LACTIC ACID, PLASMA: Lactic Acid, Venous: 3.2 mmol/L (ref 0.5–1.9)

## 2021-10-07 LAB — CK: Total CK: 23 U/L — ABNORMAL LOW (ref 38–234)

## 2021-10-07 LAB — TROPONIN I (HIGH SENSITIVITY): Troponin I (High Sensitivity): 16 ng/L (ref <18)

## 2021-10-07 MED ORDER — LEVETIRACETAM IN NACL 500 MG/100ML IV SOLN
500.0000 mg | Freq: Once | INTRAVENOUS | Status: AC
  Administered 2021-10-07: 500 mg via INTRAVENOUS
  Filled 2021-10-07: qty 100

## 2021-10-07 MED ORDER — HEPARIN SODIUM (PORCINE) 5000 UNIT/ML IJ SOLN
5000.0000 [IU] | Freq: Three times a day (TID) | INTRAMUSCULAR | Status: DC
  Administered 2021-10-07 – 2021-10-08 (×3): 5000 [IU] via SUBCUTANEOUS
  Filled 2021-10-07 (×3): qty 1

## 2021-10-07 MED ORDER — LORAZEPAM 2 MG/ML IJ SOLN
1.0000 mg | Freq: Once | INTRAMUSCULAR | Status: AC
  Administered 2021-10-07: 1 mg via INTRAVENOUS
  Filled 2021-10-07: qty 1

## 2021-10-07 MED ORDER — LEVETIRACETAM IN NACL 1000 MG/100ML IV SOLN
1000.0000 mg | Freq: Once | INTRAVENOUS | Status: AC
  Administered 2021-10-07: 1000 mg via INTRAVENOUS

## 2021-10-07 MED ORDER — SODIUM CHLORIDE 0.9 % IV BOLUS
500.0000 mL | Freq: Once | INTRAVENOUS | Status: AC
  Administered 2021-10-07: 500 mL via INTRAVENOUS

## 2021-10-07 MED ORDER — LORAZEPAM 2 MG/ML IJ SOLN: 2.0000 mg | Freq: Once | INTRAMUSCULAR | Status: DC

## 2021-10-07 MED ORDER — SODIUM CHLORIDE 0.9 % IV SOLN: INTRAVENOUS | Status: DC

## 2021-10-07 MED ORDER — SODIUM CHLORIDE 0.9 % IV SOLN: 2000.0000 mg | Freq: Once | INTRAVENOUS | Status: DC

## 2021-10-07 MED ORDER — LEVETIRACETAM IN NACL 1000 MG/100ML IV SOLN
1000.0000 mg | Freq: Once | INTRAVENOUS | Status: AC
  Administered 2021-10-07: 1000 mg via INTRAVENOUS
  Filled 2021-10-07: qty 100

## 2021-10-07 MED ORDER — LORAZEPAM 2 MG/ML IJ SOLN
1.0000 mg | Freq: Once | INTRAMUSCULAR | Status: AC
  Administered 2021-10-07: 1 mg via INTRAVENOUS

## 2021-10-07 MED ORDER — LEVETIRACETAM IN NACL 500 MG/100ML IV SOLN
500.0000 mg | Freq: Two times a day (BID) | INTRAVENOUS | Status: DC
Start: 2021-10-07 — End: 2021-10-08
  Administered 2021-10-07 – 2021-10-08 (×2): 500 mg via INTRAVENOUS
  Filled 2021-10-07 (×2): qty 100

## 2021-10-07 MED ORDER — LEVETIRACETAM IN NACL 1500 MG/100ML IV SOLN
1500.0000 mg | Freq: Once | INTRAVENOUS | Status: DC
  Filled 2021-10-07: qty 100

## 2021-10-07 NOTE — ED Triage Notes (Signed)
Pt arrives via EMS from home, alert, oriented x4 at baseline, no prev hx of seizures. Pt began seizing around 10:30 pt continues seizing on arrival to room. Pts seizures involve primarily the face and right arm. PT received 7.5mg  versed PTA. 22g left arm.  ?

## 2021-10-07 NOTE — Consult Note (Signed)
Neurology Consult H&P ? ?[name] ?MR# [id number] ?10/07/2021 ? ? ?CC: seizure ? ?History is obtained from: family and chart. ? ?HPI: Sherry [name] is a 88 y.o. female PMHx as reviewed below previous history of stroke at baseline is AAOx4 family noticed seizure activity which began at 1030 this morning. ? ?Family noticed synchronous jerking of the head, right side of the face as well and palate elevation ? ?EMS provied 7.5mg  midazolam PTA. ?And received LEV 1,500mg  bolus in ED. ? ?Family sates she has never had this before and has neve had seizure in the past. ? ? ?ROS: Unable to assess due to encephalopathy. ? ?Past Medical History:  ?Diagnosis Date  ? Acid reflux   ? Arthritis   ? Atrial fibrillation ([place])   ? Chronic constipation   ? GERD (gastroesophageal reflux disease)   ? Hyperlipidemia   ? Hypertension   ? Osteoarthritis   ? Pressure ulcer of sacral region   ? ? ? ?Family History  ?Problem Relation Age of Onset  ? Hypertension Mother   ? Other Brother   ?     malignant neoplastic disease  ? Colon cancer Neg Hx   ? Stomach cancer Neg Hx   ? Esophageal cancer Neg Hx   ? Pancreatic cancer Neg Hx   ? ? ?Social History:  reports that she has quit smoking. She has never used smokeless tobacco. She reports that she does not drink alcohol and does not use drugs. ? ? ?Prior to Admission medications   ?Medication Sig Start Date End Date Taking? Authorizing Provider  ?acetaminophen (TYLENOL) 325 MG tablet Take 2 tablets (650 mg total) by mouth 2 (two) times daily with breakfast and lunch. 06/11/21  Yes Love, Ivan Anchors, PA-C  ?aspirin 81 MG chewable tablet Chew 1 tablet (81 mg total) by mouth daily. 06/11/21  Yes Love, Ivan Anchors, PA-C  ?B Complex-C (B-COMPLEX WITH VITAMIN C) tablet Take 1 tablet by mouth daily. 06/12/21  Yes Love, Ivan Anchors, PA-C  ?cholecalciferol (VITAMIN D) 25 MCG tablet Take 1 tablet (1,000 Units total) by mouth daily. 06/12/21  Yes Love, Ivan Anchors, PA-C  ?clopidogrel (PLAVIX) 75 MG tablet TAKE 1  TABLET(75 MG) BY MOUTH DAILY ?Patient taking differently: Take 75 mg by mouth daily. 09/30/21  Yes Raulkar, Clide Deutscher, MD  ?diltiazem (CARDIZEM) 30 MG tablet TAKE 1 TABLET(30 MG) BY MOUTH EVERY 6 HOURS ?Patient taking differently: Take 30 mg by mouth every 6 (six) hours. 09/03/21  Yes Raulkar, Clide Deutscher, MD  ?docusate sodium (COLACE) 100 MG capsule Take 1 capsule (100 mg total) by mouth 2 (two) times daily. 06/11/21  Yes Love, Ivan Anchors, PA-C  ?magnesium hydroxide (MILK OF MAGNESIA) 400 MG/5ML suspension Take 30 mLs by mouth daily. 06/12/21  Yes Love, Ivan Anchors, PA-C  ?metoprolol tartrate (LOPRESSOR) 50 MG tablet Take 50 mg by mouth 2 (two) times daily. 08/12/21  Yes [provider]  ?pantoprazole (PROTONIX) 40 MG tablet Take 1 tablet (40 mg total) by mouth at bedtime. 06/11/21  Yes Love, Ivan Anchors, PA-C  ?atorvastatin (LIPITOR) 20 MG tablet Take 20 mg by mouth daily. ?Patient not taking: Reported on 10/07/2021    [provider]  ?famotidine (PEPCID) 20 MG tablet Take 20 mg by mouth daily. ?Patient not taking: Reported on 10/07/2021 06/01/21   [provider]  ?omeprazole (PRILOSEC) 40 MG capsule omeprazole 40 mg capsule,delayed release ?Patient not taking: Reported on 10/07/2021    [provider]  ? ? ?Exam: ?Current  vital signs: ?BP 123/90   Pulse (!) 101   Temp 98.5 ?F (36.9 ?C) (Rectal)   Resp (!) 36   Ht 5\' 1"  (1.549 m)   Wt 47.4 kg   SpO2 100%   BMI 19.75 kg/m?  ? ?Physical Exam  ?Constitutional: Appears well-developed and well-nourished.  ?Psych: unable to assess. ?Eyes: No scleral injection ?HENT: No OP obstruction. ?Head: Normocephalic.  ?Cardiovascular: Normal rate and regular rhythm.  ?Respiratory: Effort normal, symmetric excursions bilaterally, no audible wheezing. ?GI: Soft.  No distension. There is no tenderness.  ?Skin: WDI ? ?Exam was dificult due to receiving 7.5mg  midazolam in the field and an additional 1mg  lorazepam iv. ? ?Neuro: ?Mental Status: Obtunded non  verbal. ?Visual Fields unable to assess due to forced eye closure ?EOMI without ptosis or diplopia.  ?Tone is normal. Bulk is normal. Does not move extremities to noxious Stimulus. ?Gait - Deferred ? ?I have reviewed labs in epic and the pertinent results are: ?CBG 103 ? ?I have reviewed the chronic infarct in the left frontal operculum. ? ?Assessment: Sherry [name] is a 88 y.o. female PMHx recent  stroke (ASA and clopidogrel) first time seizure. Seizure seminology with synchronous myoclonic activity in right upper extremity, perioral, periorbital and head. She did not respond to LEV 1,500mg  and she was loaded with an additional LEV 2,000mg  seizures subsided and will continue 500mg  bid renally adjusted. ? ?Her baseline is not entirely clear and therefore we will continue  ?Family states there was no cognitive decline or behavioral changes noted. No recent  illness and there fore lower suspicion for anti-leucine-rich-glioma inactivated 1, CJD or other autoimmune encephalitis. Once she is stable may consider  lumbar puncture if indicated. ? ?Plan: ?- Reload LEV 2,000mg  stat. ?- STAT routine to LTM EEG  use MRI safe leads. ?- LEV maintenance dose 500mg  bid for now and may need to maximize if needed. ?- MRI brain without contrast when stable. ?- She may need lumbar puncture. ?- Recommend metabolic/infectious workup  ?- Recommend PT/OT/SLP consult. ?- Lorazepam 2mg  IV as needed seizure lasting more than 5 minutes.  Please notify neurology if administered. ? ?This patient is critically ill and at significant risk of neurological worsening, death and care requires constant monitoring of vital signs, hemodynamics,respiratory and cardiac monitoring, neurological assessment, discussion with family, other specialists and medical decision making of high complexity. I spent 75 minutes of neurocritical care time  in the care of  this patient. This was time spent independent of any time provided by nurse practitioner or  PA. ? ?Electronically signed by:  ?Lynnae Sandhoff, MD ?Page: [id number] ?10/07/2021, 4:35 PM ? ?If 7pm- 7am, please page neurology on call as listed in [place]. ? ?

## 2021-10-07 NOTE — Progress Notes (Signed)
LTM hook up. [facility] not monitoring until patient in permanent room. MRI leads were applied.  ?

## 2021-10-07 NOTE — ED Notes (Signed)
Report attempted x 1 to 3W. Charge RN to place purple man in order to handoff report to RN.  ?

## 2021-10-07 NOTE — Progress Notes (Signed)
EEG complete - results pending 

## 2021-10-07 NOTE — ED Provider Notes (Signed)
?[facility] ?Provider Note ? ? ?CSN: [id number] ?Arrival date & time: 10/07/21  1129 ? ?  ? ?History ? ?Chief Complaint  ?Patient presents with  ? Seizures  ? ? ?Sherry [name] is a 88 y.o. female. ? ?Patient presents with persistent seizures and altered mental status.  Seizures mostly in the right arm and face started around 1030 and have been constant since.  Decreased severity however persistent despite 2 dose of midazolam on route by EMS.  No history of seizures.  No trauma.  Patient did have a stroke last year.  Patient is normally alert and oriented x4, conversant with family, walks with a walker.  No fevers or infectious symptoms.  Family arrived later to provide further details unable to get details from patient. ? ? ?  ? ?Home Medications ?Prior to Admission medications   ?Medication Sig Start Date End Date Taking? Authorizing Provider  ?acetaminophen (TYLENOL) 325 MG tablet Take 2 tablets (650 mg total) by mouth 2 (two) times daily with breakfast and lunch. 06/11/21  Yes Love, Ivan Anchors, PA-C  ?aspirin 81 MG chewable tablet Chew 1 tablet (81 mg total) by mouth daily. 06/11/21  Yes Love, Ivan Anchors, PA-C  ?B Complex-C (B-COMPLEX WITH VITAMIN C) tablet Take 1 tablet by mouth daily. 06/12/21  Yes Love, Ivan Anchors, PA-C  ?cholecalciferol (VITAMIN D) 25 MCG tablet Take 1 tablet (1,000 Units total) by mouth daily. 06/12/21  Yes Love, Ivan Anchors, PA-C  ?clopidogrel (PLAVIX) 75 MG tablet TAKE 1 TABLET(75 MG) BY MOUTH DAILY ?Patient taking differently: Take 75 mg by mouth daily. 09/30/21  Yes Raulkar, Clide Deutscher, MD  ?diltiazem (CARDIZEM) 30 MG tablet TAKE 1 TABLET(30 MG) BY MOUTH EVERY 6 HOURS ?Patient taking differently: Take 30 mg by mouth every 6 (six) hours. 09/03/21  Yes Raulkar, Clide Deutscher, MD  ?docusate sodium (COLACE) 100 MG capsule Take 1 capsule (100 mg total) by mouth 2 (two) times daily. 06/11/21  Yes Love, Ivan Anchors, PA-C  ?magnesium hydroxide (MILK OF MAGNESIA) 400 MG/5ML  suspension Take 30 mLs by mouth daily. 06/12/21  Yes Love, Ivan Anchors, PA-C  ?metoprolol tartrate (LOPRESSOR) 50 MG tablet Take 50 mg by mouth 2 (two) times daily. 08/12/21  Yes [provider]  ?pantoprazole (PROTONIX) 40 MG tablet Take 1 tablet (40 mg total) by mouth at bedtime. 06/11/21  Yes Love, Ivan Anchors, PA-C  ?atorvastatin (LIPITOR) 20 MG tablet Take 20 mg by mouth daily. ?Patient not taking: Reported on 10/07/2021    [provider]  ?famotidine (PEPCID) 20 MG tablet Take 20 mg by mouth daily. ?Patient not taking: Reported on 10/07/2021 06/01/21   [provider]  ?omeprazole (PRILOSEC) 40 MG capsule omeprazole 40 mg capsule,delayed release ?Patient not taking: Reported on 10/07/2021    [provider]  ?   ? ?Allergies    ?Patient has no known allergies.   ? ?Review of Systems   ?Review of Systems  ?Unable to perform ROS: Mental status change  ? ?Physical Exam ?Updated Vital Signs ?BP 123/90   Pulse (!) 101   Temp 98.5 ?F (36.9 ?C) (Rectal)   Resp (!) 36   Ht 5\' 1"  (1.549 m)   Wt 47.4 kg   SpO2 100%   BMI 19.75 kg/m?  ?Physical Exam ?Vitals and nursing note reviewed.  ?Constitutional:   ?   General: She is not in acute distress. ?   Appearance: She is well-developed.  ?HENT:  ?   Head: Normocephalic  and atraumatic.  ?   Nose: No congestion.  ?   Mouth/Throat:  ?   Mouth: Mucous membranes are moist.  ?Eyes:  ?   General:     ?   Right eye: No discharge.     ?   Left eye: No discharge.  ?   Conjunctiva/sclera: Conjunctivae normal.  ?Neck:  ?   Trachea: No tracheal deviation.  ?Cardiovascular:  ?   Rate and Rhythm: Normal rate and regular rhythm.  ?Pulmonary:  ?   Effort: Pulmonary effort is normal.  ?   Breath sounds: Normal breath sounds.  ?Abdominal:  ?   General: There is no distension.  ?   Palpations: Abdomen is soft.  ?   Tenderness: There is no abdominal tenderness. There is no guarding.  ?Musculoskeletal:  ?   Cervical back: Normal range of motion and neck supple.  No rigidity.  ?Skin: ?   General: Skin is warm.  ?   Capillary Refill: Capillary refill takes less than 2 seconds.  ?   Findings: No rash.  ?Neurological:  ?   Mental Status: She is alert.  ?   Cranial Nerves: Cranial nerve deficit present.  ?   Comments: Patient has intermittent twitching to the right arm and hand and lower face.  Patient has general weakness, no verbal response.  Patient will not follow commands.  Pupils equal 2 mm bilateral.  ?Psychiatric:  ?   Comments: Altered mental, no verbal discernible response  ? ? ?ED Results / Procedures / Treatments   ?Labs ?(all labs ordered are listed, but only abnormal results are displayed) ?Labs Reviewed  ?COMPREHENSIVE METABOLIC PANEL - Abnormal; Notable for the following components:  ?    Result Value  ? CO2 21 (*)   ? BUN 26 (*)   ? Creatinine, Ser 1.37 (*)   ? Total Protein 5.9 (*)   ? Albumin 2.9 (*)   ? AST 44 (*)   ? GFR, Estimated 34 (*)   ? All other components within normal limits  ?CBC WITH DIFFERENTIAL/PLATELET - Abnormal; Notable for the following components:  ? WBC 3.9 (*)   ? RDW 15.9 (*)   ? All other components within normal limits  ?RAPID URINE DRUG SCREEN, HOSP PERFORMED - Abnormal; Notable for the following components:  ? Benzodiazepines POSITIVE (*)   ? All other components within normal limits  ?CK - Abnormal; Notable for the following components:  ? Total CK 23 (*)   ? All other components within normal limits  ?CBG MONITORING, ED - Abnormal; Notable for the following components:  ? Glucose-Capillary 103 (*)   ? All other components within normal limits  ?I-STAT ARTERIAL BLOOD GAS, ED - Abnormal; Notable for the following components:  ? pO2, Arterial 161 (*)   ? All other components within normal limits  ?URINE CULTURE  ?CULTURE, BLOOD (ROUTINE X 2)  ?CULTURE, BLOOD (ROUTINE X 2)  ?URINALYSIS, ROUTINE W REFLEX MICROSCOPIC  ?MAGNESIUM  ?PHOSPHORUS  ?BLOOD GAS, ARTERIAL  ?LACTIC ACID, PLASMA  ?VITAMIN B12  ?BASIC METABOLIC PANEL  ?CBC  ?VITAMIN  E  ?CBG MONITORING, ED  ?TROPONIN I (HIGH SENSITIVITY)  ? ? ?EKG ?EKG Interpretation ? ?Date/Time:  Tuesday October 07 2021 11:35:30 EDT ?Ventricular Rate:  135 ?PR Interval:    ?QRS Duration: 137 ?QT Interval:  366 ?QTC Calculation: 549 ?R Axis:   -57 ?Text Interpretation: Atrial fibrillation RBBB and LAFB poor baseline Confirmed by Elnora Morrison [phone]) on 10/07/2021 11:38:35 AM ? ?  Radiology ?CT Head Wo Contrast ? ?Result Date: 10/07/2021 ?CLINICAL DATA:  Seizure, new-onset, no history of trauma seizure new onset EXAM: CT HEAD WITHOUT CONTRAST TECHNIQUE: Contiguous axial images were obtained from the base of the skull through the vertex without intravenous contrast. RADIATION DOSE REDUCTION: This exam was performed according to the departmental dose-optimization program which includes automated exposure control, adjustment of the mA and/or kV according to patient size and/or use of iterative reconstruction technique. COMPARISON:  Head CT 05/25/2021 FINDINGS: Brain: No evidence of acute intracranial hemorrhage or extra-axial collection.No new loss of gray-white matter differentiation. Chronic infarct in the left frontal operculum.The ventricles are unchanged in size.Confluent periventricular and subcortical white matter hypoattenuation, which is nonspecific but likely sequela of chronic small vessel ischemic disease.Mild cerebral atrophy Vascular: Vascular calcifications Skull: Negative of for skull fracture. Sinuses/Orbits: No acute finding. Other: None. IMPRESSION: No acute intracranial abnormality. Chronic infarct in the left operculum. Unchanged chronic small vessel ischemic disease. Electronically Signed   By: Maurine Simmering M.D.   On: 10/07/2021 12:49  ? ?DG Chest Portable 1 View ? ?Result Date: 10/07/2021 ?CLINICAL DATA:  Seizure EXAM: PORTABLE CHEST 1 VIEW COMPARISON:  06/10/2021 FINDINGS: Heart size upper normal. Negative for heart failure. Atherosclerotic calcification aortic arch Small linear density along the  minor fissure likely atelectasis or pleural fluid. No edema or pneumonia. Degenerative change in both shoulders. IMPRESSION: Small linear airspace disease right mid lung may represent atelectasis or fluid i

## 2021-10-07 NOTE — ED Notes (Signed)
Report handed off to night shift RN Tisha B. Will transport patient upstairs at this time.  ?

## 2021-10-07 NOTE — Progress Notes (Signed)
ABG ordered and obtained on 2L [place].  No further changes at this time.  ? ? Latest Reference Range & Units 10/07/21 13:54  ?Sample type  ARTERIAL  ?pH, Arterial 7.35 - 7.45  7.373  ?pCO2 arterial 32 - 48 mmHg 39.4  ?pO2, Arterial 83 - 108 mmHg 161 (H)  ?TCO2 22 - 32 mmol/L 24  ?Acid-base deficit 0.0 - 2.0 mmol/L 2.0  ?Bicarbonate 20.0 - 28.0 mmol/L 22.9  ?O2 Saturation % 99  ? ?

## 2021-10-07 NOTE — H&P (Signed)
Referring Physician: Blane OharaJoshua Zavitz, MD  ? ?Sherry ApleyMarion S [name] is an [age] y.o. female.                       ?Chief Complaint: Seizures, new onset ? ?HPI: 88 years old black female came to ER by EMS with right arm seizures. No prior h/o seizures. Patient was alert and oriented and walked with prior to seizures this AM around 10:30. ?She is sedated now with anti-seizure medications and EEG will be done soon. ? ?Past Medical History:  ?Diagnosis Date  ? Acid reflux   ? Arthritis   ? Atrial fibrillation ([facility])   ? Chronic constipation   ? GERD (gastroesophageal reflux disease)   ? Hyperlipidemia   ? Hypertension   ? Osteoarthritis   ? Pressure ulcer of sacral region   ?  ? ? ?Past Surgical History:  ?Procedure Laterality Date  ? NO PAST SURGERIES    ? ? ?Family History  ?Problem Relation Age of Onset  ? Hypertension Mother   ? Other Brother   ?     malignant neoplastic disease  ? Colon cancer Neg Hx   ? Stomach cancer Neg Hx   ? Esophageal cancer Neg Hx   ? Pancreatic cancer Neg Hx   ? ?Social History:  reports that she has quit smoking. She has never used smokeless tobacco. She reports that she does not drink alcohol and does not use drugs. ? ?Allergies: No Known Allergies ? ?(Not in a hospital admission) ? ? ?Results for orders placed or performed during the hospital encounter of 10/07/21 (from the past 48 hour(s))  ?CBG monitoring, ED     Status: Abnormal  ? Collection Time: 10/07/21 11:35 AM  ?Result Value Ref Range  ? Glucose-Capillary 103 (H) 70 - 99 mg/dL  ?  Comment: Glucose reference range applies only to samples taken after fasting for at least 8 hours.  ?Comprehensive metabolic panel     Status: Abnormal  ? Collection Time: 10/07/21 12:05 PM  ?Result Value Ref Range  ? Sodium 143 135 - 145 mmol/L  ? Potassium 3.9 3.5 - 5.1 mmol/L  ? Chloride 108 98 - 111 mmol/L  ? CO2 21 (L) 22 - 32 mmol/L  ? Glucose, Bld 97 70 - 99 mg/dL  ?  Comment: Glucose reference range applies only to samples taken after fasting for at  least 8 hours.  ? BUN 26 (H) 8 - 23 mg/dL  ? Creatinine, Ser 1.37 (H) 0.44 - 1.00 mg/dL  ? Calcium 9.0 8.9 - 10.3 mg/dL  ? Total Protein 5.9 (L) 6.5 - 8.1 g/dL  ? Albumin 2.9 (L) 3.5 - 5.0 g/dL  ? AST 44 (H) 15 - 41 U/L  ? ALT 20 0 - 44 U/L  ? Alkaline Phosphatase 106 38 - 126 U/L  ? Total Bilirubin 0.7 0.3 - 1.2 mg/dL  ? GFR, Estimated 34 (L) >60 mL/min  ?  Comment: (NOTE) ?Calculated using the CKD-EPI Creatinine Equation (2021) ?  ? Anion gap 14 5 - 15  ?  Comment: Performed at [facility], [address] [place], [place] [address]  ?CBC with Differential     Status: Abnormal  ? Collection Time: 10/07/21 12:05 PM  ?Result Value Ref Range  ? WBC 3.9 (L) 4.0 - 10.5 K/uL  ? RBC 4.71 3.87 - 5.11 MIL/uL  ? Hemoglobin 13.0 12.0 - 15.0 g/dL  ? HCT 41.8 36.0 - 46.0 %  ? MCV  88.7 80.0 - 100.0 fL  ? MCH 27.6 26.0 - 34.0 pg  ? MCHC 31.1 30.0 - 36.0 g/dL  ? RDW 15.9 (H) 11.5 - 15.5 %  ? Platelets 233 150 - 400 K/uL  ? nRBC 0.0 0.0 - 0.2 %  ? Neutrophils Relative % 53 %  ? Neutro Abs 2.1 1.7 - 7.7 K/uL  ? Lymphocytes Relative 32 %  ? Lymphs Abs 1.3 0.7 - 4.0 K/uL  ? Monocytes Relative 9 %  ? Monocytes Absolute 0.4 0.1 - 1.0 K/uL  ? Eosinophils Relative 5 %  ? Eosinophils Absolute 0.2 0.0 - 0.5 K/uL  ? Basophils Relative 1 %  ? Basophils Absolute 0.0 0.0 - 0.1 K/uL  ? Immature Granulocytes 0 %  ? Abs Immature Granulocytes 0.01 0.00 - 0.07 K/uL  ?  Comment: Performed at [facility], [address] [place], [place] [address]  ?Magnesium     Status: None  ? Collection Time: 10/07/21 12:05 PM  ?Result Value Ref Range  ? Magnesium 2.0 1.7 - 2.4 mg/dL  ?  Comment: Performed at [facility], [address], [place], [place] [address]  ?I-Stat arterial blood gas, ED     Status: Abnormal  ? Collection Time: 10/07/21  1:54 PM  ?Result Value Ref Range  ? pH, Arterial 7.373 7.35 - 7.45  ? pCO2 arterial 39.4 32 - 48 mmHg  ? pO2, Arterial 161 (H) 83 - 108 mmHg  ? Bicarbonate 22.9 20.0 - 28.0 mmol/L  ? TCO2 24 22 - 32  mmol/L  ? O2 Saturation 99 %  ? Acid-base deficit 2.0 0.0 - 2.0 mmol/L  ? Sodium 141 135 - 145 mmol/L  ? Potassium 3.8 3.5 - 5.1 mmol/L  ? Calcium, Ion 1.28 1.15 - 1.40 mmol/L  ? HCT 40.0 36.0 - 46.0 %  ? Hemoglobin 13.6 12.0 - 15.0 g/dL  ? Sample type ARTERIAL   ? ?CT Head Wo Contrast ? ?Result Date: 10/07/2021 ?CLINICAL DATA:  Seizure, new-onset, no history of trauma seizure new onset EXAM: CT HEAD WITHOUT CONTRAST TECHNIQUE: Contiguous axial images were obtained from the base of the skull through the vertex without intravenous contrast. RADIATION DOSE REDUCTION: This exam was performed according to the departmental dose-optimization program which includes automated exposure control, adjustment of the mA and/or kV according to patient size and/or use of iterative reconstruction technique. COMPARISON:  Head CT 05/25/2021 FINDINGS: Brain: No evidence of acute intracranial hemorrhage or extra-axial collection.No new loss of gray-white matter differentiation. Chronic infarct in the left frontal operculum.The ventricles are unchanged in size.Confluent periventricular and subcortical white matter hypoattenuation, which is nonspecific but likely sequela of chronic small vessel ischemic disease.Mild cerebral atrophy Vascular: Vascular calcifications Skull: Negative of for skull fracture. Sinuses/Orbits: No acute finding. Other: None. IMPRESSION: No acute intracranial abnormality. Chronic infarct in the left operculum. Unchanged chronic small vessel ischemic disease. Electronically Signed   By: Caprice Renshaw M.D.   On: 10/07/2021 12:49  ? ?DG Chest Portable 1 View ? ?Result Date: 10/07/2021 ?CLINICAL DATA:  Seizure EXAM: PORTABLE CHEST 1 VIEW COMPARISON:  06/10/2021 FINDINGS: Heart size upper normal. Negative for heart failure. Atherosclerotic calcification aortic arch Small linear density along the minor fissure likely atelectasis or pleural fluid. No edema or pneumonia. Degenerative change in both shoulders. IMPRESSION:  Small linear airspace disease right mid lung may represent atelectasis or fluid in the minor fissure. Otherwise lungs are clear. Electronically Signed   By: Marlan Palau M.D.  On: 10/07/2021 12:47   ? ?Review Of Systems ?As per HPI and PMH. ? ?Blood pressure 123/90, pulse (!) 101, temperature 98.5 ?F (36.9 ?C), temperature source Rectal, resp. rate (!) 36, height 5\' 1"  (1.549 m), weight 47.4 kg, SpO2 100 %. Body mass index is 19.75 kg/m?. ?General appearance: appears stated age and moderate respiratory distress  ?Head: Normocephalic, atraumatic. ?Eyes: Brown eyes, pink conjunctiva, corneas clear.  ?Neck: No adenopathy, no carotid bruit, no JVD, supple, symmetrical, trachea midline and thyroid not enlarged. ?Resp: Clear to auscultation bilaterally. ?Cardio: Regular rate and rhythm, S1, S2 normal, II/VI systolic murmur, no click, rub or gallop ?GI: Soft, non-tender; bowel sounds normal; no organomegaly. ?Extremities: No edema, cyanosis or clubbing. ?Skin: Warm and dry.  ?Neurologic: Alert and oriented X 0. ? ?Assessment/Plan ?Seizure disorder, focal, new onset ?Acute respiratory failure with mild hypoxia ?HTN ?Paroxysmal atrial fibrillation ?Arthritis ?HLD ?GERD ? ?Plan: ?Follow with neurology. ?Limited code. ? ?Time spent: Review of old records, Lab, x-rays, EKG, other cardiac tests, examination, discussion with patient/Doctor/Nurse/Family over 70 minutes. ? ? , MD ? ?10/07/2021, 2:26 PM ? ? ? ?

## 2021-10-07 NOTE — Procedures (Addendum)
Patient Name: Sherry [name]  ?MRN: [id number]  ?Epilepsy Attending: Charlsie Quest  ?Referring Physician/Provider: Rica Mote, MD ?Date: 10/07/2021 ?Duration: 23.37 mins ? ?Patient history: 88 y.o. female PMHx as reviewed below previous history of stroke at baseline is AAOx4 family noticed seizure activity which began at 1030 this morning. EEG to evaluate for seizure ? ?Level of alertness: lethargic ? ?AEDs during EEG study: LEV ? ?Technical aspects: This EEG study was done with scalp electrodes positioned according to the 10-20 International system of electrode placement. Electrical activity was acquired at a sampling rate of 500Hz  and reviewed with a high frequency filter of 70Hz  and a low frequency filter of 1Hz . EEG data were recorded continuously and digitally stored.  ? ?Description: EEG showed continuous generalized and lateralized left hemisphere 3-6hz  theta-delta slowing. Lateralized periodic discharges at 1hz  were also noted in left hemisphere Hyperventilation and photic stimulation were not performed.    ? ?Of note, study was technically difficult due to significant chewing artifact.  ? ?ABNORMALITY ?- Lateralized periodic discharges ( LPD ) left hemisphere ?- Continuous slow, generalized and lateralized left hemisphere ? ?IMPRESSION: ?This technically difficult study showed evidence of epileptogenicity and cortical dysfunction in left hemisphere with high potential for seizures. Additionally there is moderate diffuse encephalopathy, non specific etiology. ? ?Dr was notified. ? ?  ? ?

## 2021-10-07 NOTE — ED Notes (Signed)
This RN attempted x1 for blood cultures and was unable to obtain due to patient being a difficult stick. Will page phlebotomy at this time for blood draw.  ?

## 2021-10-07 NOTE — ED Notes (Signed)
Report handed off to Marcy Panning via [employer]. Will transport patient to room once room is marked clean.  ?

## 2021-10-08 ENCOUNTER — Inpatient Hospital Stay (HOSPITAL_COMMUNITY): Payer: 59

## 2021-10-08 DIAGNOSIS — R569 Unspecified convulsions: Secondary | ICD-10-CM | POA: Diagnosis not present

## 2021-10-08 DIAGNOSIS — G40901 Epilepsy, unspecified, not intractable, with status epilepticus: Secondary | ICD-10-CM | POA: Diagnosis not present

## 2021-10-08 LAB — CBC
HCT: 40.6 % (ref 36.0–46.0)
Hemoglobin: 13.2 g/dL (ref 12.0–15.0)
MCH: 29.6 pg (ref 26.0–34.0)
MCHC: 32.5 g/dL (ref 30.0–36.0)
MCV: 91 fL (ref 80.0–100.0)
Platelets: 210 10*3/uL (ref 150–400)
RBC: 4.46 MIL/uL (ref 3.87–5.11)
RDW: 12.5 % (ref 11.5–15.5)
WBC: 5.7 10*3/uL (ref 4.0–10.5)
nRBC: 0 % (ref 0.0–0.2)

## 2021-10-08 LAB — URINE CULTURE: Culture: NO GROWTH

## 2021-10-08 LAB — BASIC METABOLIC PANEL
Anion gap: 17 — ABNORMAL HIGH (ref 5–15)
BUN: 26 mg/dL — ABNORMAL HIGH (ref 8–23)
CO2: 16 mmol/L — ABNORMAL LOW (ref 22–32)
Calcium: 9.3 mg/dL (ref 8.9–10.3)
Chloride: 106 mmol/L (ref 98–111)
Creatinine, Ser: 1.13 mg/dL — ABNORMAL HIGH (ref 0.44–1.00)
GFR, Estimated: 43 mL/min — ABNORMAL LOW (ref >60)
Glucose, Bld: 73 mg/dL (ref 70–99)
Potassium: 4.8 mmol/L (ref 3.5–5.1)
Sodium: 139 mmol/L (ref 135–145)

## 2021-10-08 LAB — VITAMIN B12: Vitamin B-12: 1298 pg/mL — ABNORMAL HIGH (ref 180–914)

## 2021-10-08 MED ORDER — PHENYTOIN SODIUM 50 MG/ML IJ SOLN
75.0000 mg | Freq: Three times a day (TID) | INTRAMUSCULAR | Status: DC
  Administered 2021-10-08 – 2021-10-13 (×15): 75 mg via INTRAVENOUS
  Filled 2021-10-08 (×26): qty 1.5

## 2021-10-08 MED ORDER — ACETAMINOPHEN 650 MG RE SUPP
650.0000 mg | Freq: Four times a day (QID) | RECTAL | Status: DC | PRN
  Administered 2021-10-08 (×2): 650 mg via RECTAL
  Filled 2021-10-08 (×2): qty 1

## 2021-10-08 MED ORDER — LEVETIRACETAM IN NACL 1000 MG/100ML IV SOLN: 1000.0000 mg | Freq: Two times a day (BID) | INTRAVENOUS | Status: DC

## 2021-10-08 MED ORDER — SODIUM CHLORIDE 0.9 % IV SOLN
750.0000 mg | Freq: Two times a day (BID) | INTRAVENOUS | Status: DC
  Administered 2021-10-08 – 2021-10-12 (×9): 750 mg via INTRAVENOUS
  Filled 2021-10-08 (×17): qty 7.5

## 2021-10-08 MED ORDER — ORAL CARE MOUTH RINSE
15.0000 mL | Freq: Two times a day (BID) | OROMUCOSAL | Status: DC
  Administered 2021-10-08 – 2021-10-16 (×16): 15 mL via OROMUCOSAL

## 2021-10-08 MED ORDER — GADOBUTROL 1 MMOL/ML IV SOLN
5.0000 mL | Freq: Once | INTRAVENOUS | Status: AC | PRN
  Administered 2021-10-08: 5 mL via INTRAVENOUS

## 2021-10-08 MED ORDER — LORAZEPAM 2 MG/ML IJ SOLN: 1.0000 mg | INTRAMUSCULAR | Status: DC | PRN

## 2021-10-08 MED ORDER — SODIUM CHLORIDE 0.9 % IV SOLN
200.0000 mg | INTRAVENOUS | Status: AC
  Administered 2021-10-08: 200 mg via INTRAVENOUS
  Filled 2021-10-08: qty 20

## 2021-10-08 MED ORDER — SODIUM CHLORIDE 0.9 % IV SOLN
700.0000 mg | INTRAVENOUS | Status: AC
  Administered 2021-10-08: 700 mg via INTRAVENOUS
  Filled 2021-10-08: qty 14

## 2021-10-08 NOTE — Progress Notes (Signed)
Ref: Dixie Dials, MD ? ? ?Subjective:  ?Remains sedated. T max 100 degree F. ?Monitor and EKG shows atrial fibrillation and RBBB. ? ?Objective:  ?Vital Signs in the last 24 hours: ?Temp:  [98 ?F (36.7 ?C)-100 ?F (37.8 ?C)] 100 ?F (37.8 ?C) (03/22 0745) ?Pulse Rate:  [82-127] 82 (03/22 0745) ?Cardiac Rhythm: Atrial fibrillation;Atrial flutter (03/22 0700) ?Resp:  [15-36] 20 (03/22 0745) ?BP: (121-151)/(65-98) 144/89 (03/22 0745) ?SpO2:  [97 %-100 %] 100 % (03/22 0745) ? ?Physical Exam: ?BP Readings from Last 1 Encounters:  ?10/08/21 (!) 144/89  ?   ?Wt Readings from Last 1 Encounters:  ?10/07/21 47.4 kg  ?  Weight change:  Body mass index is 19.75 kg/m?. ?HEENT: [place]/AT, Eyes-Brown, Conjunctiva-Pink, Sclera-Non-icteric ?Neck: No JVD, No bruit, Trachea midline. ?Lungs:  Clear, Bilateral. ?Cardiac:  Regular rhythm, normal S1 and S2, no S3. III/VI musical systolic murmur. ?Abdomen:  Soft, non-tender. BS present. ?Extremities:  No edema present. No cyanosis. No clubbing. ?CNS: AxOx0.  ?Skin: Warm and dry. ? ? ?Intake/Output from previous day: ?03/21 0701 - 03/22 0700 ?In: 900 [IV [id number]] ?Out: -  ? ? ? ?Lab Results: ?BMET ?   ?Component Value Date/Time  ? NA 139 10/08/2021 0328  ? NA 141 10/07/2021 1354  ? NA 143 10/07/2021 1205  ? K 4.8 10/08/2021 0328  ? K 3.8 10/07/2021 1354  ? K 3.9 10/07/2021 1205  ? CL 106 10/08/2021 0328  ? CL 108 10/07/2021 1205  ? CL 100 06/10/2021 0516  ? CO2 16 (L) 10/08/2021 0328  ? CO2 21 (L) 10/07/2021 1205  ? CO2 25 06/10/2021 0516  ? GLUCOSE 73 10/08/2021 0328  ? GLUCOSE 97 10/07/2021 1205  ? GLUCOSE 94 06/10/2021 0516  ? BUN 26 (H) 10/08/2021 0328  ? BUN 26 (H) 10/07/2021 1205  ? BUN 10 06/10/2021 0516  ? CREATININE 1.13 (H) 10/08/2021 0328  ? CREATININE 1.37 (H) 10/07/2021 1205  ? CREATININE 1.10 (H) 06/10/2021 0516  ? CALCIUM 9.3 10/08/2021 0328  ? CALCIUM 9.0 10/07/2021 1205  ? CALCIUM 9.1 06/10/2021 0516  ? GFRNONAA 43 (L) 10/08/2021 0328  ? GFRNONAA 34 (L) 10/07/2021 1205  ?  GFRNONAA 45 (L) 06/10/2021 0516  ? GFRAA 43 (L) 08/07/2017 1519  ? GFRAA (L) 01/18/2010 2350  ?  46        ?The eGFR has been calculated ?using the MDRD equation. ?This calculation has not been ?validated in all clinical ?situations. ?eGFR's persistently ?<60 mL/min signify ?possible Chronic Kidney Disease.  ? GFRAA (L) 05/31/2007 1100  ?  55        ?The eGFR has been calculated ?using the MDRD equation. ?This calculation has not been ?validated in all clinical  ? ?CBC ?   ?Component Value Date/Time  ? WBC 5.7 10/08/2021 0413  ? RBC 4.46 10/08/2021 0413  ? HGB 13.2 10/08/2021 0413  ? HCT 40.6 10/08/2021 0413  ? PLT 210 10/08/2021 0413  ? MCV 91.0 10/08/2021 0413  ? MCH 29.6 10/08/2021 0413  ? MCHC 32.5 10/08/2021 0413  ? RDW 12.5 10/08/2021 0413  ? LYMPHSABS 1.3 10/07/2021 1205  ? MONOABS 0.4 10/07/2021 1205  ? EOSABS 0.2 10/07/2021 1205  ? BASOSABS 0.0 10/07/2021 1205  ? ?HEPATIC Function Panel ?Recent Labs  ?  05/29/21 ?6195 06/10/21 ?0932 10/07/21 ?1205  ?PROT 6.2* 6.4* 5.9*  ? ?HEMOGLOBIN A1C ?No components found for: HGA1C,  MPG ?CARDIAC ENZYMES ?Lab Results  ?Component Value Date  ? CKTOTAL 23 (L) 10/07/2021  ? CKMB 1.8  05/29/2007  ? TROPONINI 0.02        ?NO INDICATION OF ?MYOCARDIAL INJURY. 05/28/2007  ? TROPONINI 0.03        ?NO INDICATION OF ?MYOCARDIAL INJURY. 05/27/2007  ? ?BNP ?No results for input(s): PROBNP in the last 8760 hours. ?TSH ?No results for input(s): TSH in the last 8760 hours. ?CHOLESTEROL ?Recent Labs  ?  05/24/21 ?0715  ?CHOL 208*  ? ? ?Scheduled Meds: ? heparin  5,000 Units Subcutaneous Q8H  ? mouth rinse  15 mL Mouth Rinse BID  ? ?Continuous Infusions: ? sodium chloride 50 mL/hr at 10/07/21 2305  ? levETIRAcetam 500 mg (10/08/21 0956)  ? ?PRN Meds:.LORazepam ? ?Assessment/Plan: ? Seizure disorder, focal, new onset ?Acute respiratory failure with mild hypoxia ?Severe AS ?HTN ?Atrial fibrillation ?HLD ?GERD ?Arthritis ? ?Plan: ?MRI head per neurology ? ? LOS: 1 day  ? ?Time spent  including chart review, lab review, examination, discussion with patient family : 30 min ? ? ?Dixie Dials  MD  ?10/08/2021, 11:49 AM ? ? ? ? ?

## 2021-10-08 NOTE — Progress Notes (Addendum)
Subjective: Patient continues to have right facial twitching, right upper extremity twitching.  Son at bedside. ? ?ROS: Unable to obtain due to poor mental status ? ?Examination ? ?Vital signs in last 24 hours: ?Temp:  [98 ?F (36.7 ?C)-100.9 ?F (38.3 ?C)] 100.9 ?F (38.3 ?C) (03/22 1229) ?Pulse Rate:  [82-127] 83 (03/22 1229) ?Resp:  [15-36] 23 (03/22 1229) ?BP: (123-151)/(65-98) 136/71 (03/22 1229) ?SpO2:  [97 %-100 %] 100 % (03/22 1229) ? ?General: lying in bed, NAD ?Neuro: doesn't open eyes to noxious stimuli, doesn't follow commands, PERLA, no gaze deviation, withdraws to noxious stimuli in all extremities R<L, noted to have rhythmic right facial, right, right upper extremity twitching consistent with convulsive status epilepticus ? ?Basic Metabolic Panel: ?Recent Labs  ?Lab 10/07/21 ?1205 10/07/21 ?1323 10/07/21 ?1354 10/08/21 ?0328  ?NA 143  --  141 139  ?K 3.9  --  3.8 4.8  ?CL 108  --   --  106  ?CO2 21*  --   --  16*  ?GLUCOSE 97  --   --  73  ?BUN 26*  --   --  26*  ?CREATININE 1.37*  --   --  1.13*  ?CALCIUM 9.0  --   --  9.3  ?MG 2.0  --   --   --   ?PHOS  --  3.8  --   --   ? ? ?CBC: ?Recent Labs  ?Lab 10/07/21 ?1205 10/07/21 ?1354 10/08/21 ?0413  ?WBC 3.9*  --  5.7  ?NEUTROABS 2.1  --   --   ?HGB 13.0 13.6 13.2  ?HCT 41.8 40.0 40.6  ?MCV 88.7  --  91.0  ?PLT 233  --  210  ? ? ? ?Coagulation Studies: ?No results for input(s): LABPROT, INR in the last 72 hours. ? ?Imaging ?CT head without contrast 10/07/2021: No acute intracranial abnormality. ?Chronic infarct in the left operculum. Unchanged chronic small vessel ischemic disease. ? ?ASSESSMENT AND PLAN: 88 year old female with past medical history of stroke who presented with right focal motor status epilepticus. ? ?Focal motor convulsive status epilepticus, refractory ?Acute encephalopathy, due to seizure ?-LTM EEG continues to show focal convulsive status epilepticus arising from left hemisphere ?-Etiology seizure: Most likely due to underlying chronic  infarct.  No evidence of infection, normal electrolytes. ? ?Recommendations ?-We will load with IV fosphenytoin once and start phenytoin 75 mg every 8 hours.  We will check phenytoin level tomorrow morning ?-Increase Keppra to 750 mg twice daily, maximal dose for renal impairment ?-MRI brain with and without contrast to look for acute intracranial abnormality ?-Depending on MRI we may consider lumbar puncture to look for infectious/autoimmune causes. I have discussed this with patient's daughter [name] and son at bedside. At this is an invasive test, it is possible that patient may not have wanted this. I am holding subcu heparin for now just in case we need lumbar puncture and patient family agrees ?-I also discussed with patient's daughter and son that patient continues to be in refractory status epilepticus even after 2 AEDs.  Every EEG can potentially worsen patient's sedation and patient did not want to be intubated.  Therefore, we will slowly increase medications so as to minimize sedation and avoid worsening respiratory status. ?-Continue seizure precautions ?-Avoid IV Ativan unless patient has generalized tonic-clonic seizure-like activity ?-Management of rest of comorbidities per primary team ?-Discussed plan in detail with patient's daughter and son, patient's RN as well as Dr. Algie Coffer ? ?ADDENDUM ?- Continues to be in status,  will load with IV vimpat 200mg  once ?- Next agent will likely be phenobarb ? ?CRITICAL CARE ?Performed by: ? ? ?Total critical care time: 40 minutes ? ?Critical care time was exclusive of separately billable procedures and treating other patients. ? ?Critical care was necessary to treat or prevent imminent or life-threatening deterioration. ? ?Critical care was time spent personally by me on the following activities: development of treatment plan with patient and/or surrogate as well as nursing, discussions with consultants, evaluation of patient's response to  treatment, examination of patient, obtaining history from patient or surrogate, ordering and performing treatments and interventions, ordering and review of laboratory studies, ordering and review of radiographic studies, pulse oximetry and re-evaluation of patient's condition. ? ? ?Charlsie Quest ?Epilepsy ?[facility] ?For questions after 5pm please refer to AMION to reach the Neurologist on call ? ?

## 2021-10-08 NOTE — Procedures (Addendum)
Patient Name: Sherry [name]  ?MRN: [id number]  ?Epilepsy Attending: Charlsie Quest  ?Referring Physician/Provider: Rica Mote, MD ?Duration: 10/07/2021 1508 to 10/08/2021 1508 ?  ?Patient history: 88 y.o. female PMHx as reviewed below previous history of stroke at baseline is AAOx4 family noticed seizure activity which began at 1030 this morning. EEG to evaluate for seizure ?  ?Level of alertness: lethargic ?  ?AEDs during EEG study: LEV ?  ?Technical aspects: This EEG study was done with scalp electrodes positioned according to the 10-20 International system of electrode placement. Electrical activity was acquired at a sampling rate of 500Hz  and reviewed with a high frequency filter of 70Hz  and a low frequency filter of 1Hz . EEG data were recorded continuously and digitally stored.  ?  ?Description: EEG showed continuous generalized and lateralized left hemisphere 3-6hz  theta-delta slowing.  EEG showed near continuous 3 to 6 Hz theta with delta slowing admixed with spikes at 1 to 2 Hz in left hemisphere with brief 3 to 5 seconds of EEG attenuation.  Patient was noted to have right facial twitching as well as right upper extremity twitching during the EEG.  This pattern is consistent with focal convulsive status epilepticus.  Hyperventilation and photic stimulation were not performed.    ?  ?Of note, study was technically difficult due to significant chewing artifact.  ?  ?ABNORMALITY ?- Convulsive status epilepticus, left hemisphere ?- Continuous slow, generalized and lateralized left hemisphere ?  ?IMPRESSION: ?This technically difficult study showed evidence of convulsive status epilepticus arising from left hemisphere. Additionally there is moderate diffuse encephalopathy, non specific etiology. ?   ?  ?

## 2021-10-09 DIAGNOSIS — R569 Unspecified convulsions: Secondary | ICD-10-CM | POA: Diagnosis not present

## 2021-10-09 LAB — PHENYTOIN LEVEL, TOTAL: Phenytoin Lvl: 17.1 ug/mL (ref 10.0–20.0)

## 2021-10-09 LAB — GLUCOSE, CAPILLARY
Glucose-Capillary: 107 mg/dL — ABNORMAL HIGH (ref 70–99)
Glucose-Capillary: 111 mg/dL — ABNORMAL HIGH (ref 70–99)
Glucose-Capillary: 91 mg/dL (ref 70–99)

## 2021-10-09 LAB — ALBUMIN: Albumin: 3.3 g/dL — ABNORMAL LOW (ref 3.5–5.0)

## 2021-10-09 MED ORDER — SODIUM CHLORIDE 0.9 % IV SOLN: 250.0000 mg | Freq: Once | INTRAVENOUS | Status: DC

## 2021-10-09 MED ORDER — PANTOPRAZOLE SODIUM 40 MG IV SOLR
40.0000 mg | INTRAVENOUS | Status: AC
  Administered 2021-10-09 – 2021-10-11 (×3): 40 mg via INTRAVENOUS
  Filled 2021-10-09 (×3): qty 10

## 2021-10-09 MED ORDER — HEPARIN SODIUM (PORCINE) 5000 UNIT/ML IJ SOLN
5000.0000 [IU] | Freq: Three times a day (TID) | INTRAMUSCULAR | Status: DC
  Administered 2021-10-09 – 2021-10-17 (×22): 5000 [IU] via SUBCUTANEOUS
  Filled 2021-10-09 (×22): qty 1

## 2021-10-09 MED ORDER — SODIUM CHLORIDE 0.9 % IV SOLN
1000.0000 mg | Freq: Every day | INTRAVENOUS | Status: AC
  Administered 2021-10-09 – 2021-10-11 (×3): 1000 mg via INTRAVENOUS
  Filled 2021-10-09 (×3): qty 16

## 2021-10-09 MED ORDER — SODIUM CHLORIDE 0.9 % IV SOLN
100.0000 mg | Freq: Two times a day (BID) | INTRAVENOUS | Status: DC
  Administered 2021-10-09 (×2): 100 mg via INTRAVENOUS
  Filled 2021-10-09 (×4): qty 10

## 2021-10-09 NOTE — Progress Notes (Signed)
Ref: Dixie Dials, MD ? ? ?Subjective:  ?Afebrile. ?Mild twitching of right upper eye-lid and right arm. ? ?Objective:  ?Vital Signs in the last 24 hours: ?Temp:  [98.8 ?F (37.1 ?C)-100.5 ?F (38.1 ?C)] 98.8 ?F (37.1 ?C) (03/23 1122) ?Pulse Rate:  [68-98] 98 (03/23 1122) ?Cardiac Rhythm: Atrial fibrillation;Bundle branch block (03/23 0701) ?Resp:  [15-21] 16 (03/23 1122) ?BP: (125-155)/(69-90) 147/76 (03/23 1122) ?SpO2:  [97 %-100 %] 97 % (03/23 1122) ?Weight:  [52.6 kg] 52.6 kg (03/23 0500) ? ?Physical Exam: ?BP Readings from Last 1 Encounters:  ?10/09/21 (!) 147/76  ?   ?Wt Readings from Last 1 Encounters:  ?10/09/21 52.6 kg  ?  Weight change: 5.199 kg Body mass index is 21.91 kg/m?. ?HEENT: [place]/AT, Eyes-Brown, Conjunctiva-Pink, Sclera-Non-icteric ?Neck: No JVD, No bruit, Trachea midline. ?Lungs:  Clear, Bilateral. ?Cardiac:  Regular rhythm, normal S1 and S2, no S3. II/VI systolic murmur. ?Abdomen:  Soft, non-tender. BS present. ?Extremities:  No edema present. No cyanosis. No clubbing. ?CNS: AxOx0.  ?Skin: Warm and dry. ? ? ?Intake/Output from previous day: ?No intake/output data recorded. ? ? ? ?Lab Results: ?BMET ?   ?Component Value Date/Time  ? NA 139 10/08/2021 0328  ? NA 141 10/07/2021 1354  ? NA 143 10/07/2021 1205  ? K 4.8 10/08/2021 0328  ? K 3.8 10/07/2021 1354  ? K 3.9 10/07/2021 1205  ? CL 106 10/08/2021 0328  ? CL 108 10/07/2021 1205  ? CL 100 06/10/2021 0516  ? CO2 16 (L) 10/08/2021 0328  ? CO2 21 (L) 10/07/2021 1205  ? CO2 25 06/10/2021 0516  ? GLUCOSE 73 10/08/2021 0328  ? GLUCOSE 97 10/07/2021 1205  ? GLUCOSE 94 06/10/2021 0516  ? BUN 26 (H) 10/08/2021 0328  ? BUN 26 (H) 10/07/2021 1205  ? BUN 10 06/10/2021 0516  ? CREATININE 1.13 (H) 10/08/2021 0328  ? CREATININE 1.37 (H) 10/07/2021 1205  ? CREATININE 1.10 (H) 06/10/2021 0516  ? CALCIUM 9.3 10/08/2021 0328  ? CALCIUM 9.0 10/07/2021 1205  ? CALCIUM 9.1 06/10/2021 0516  ? GFRNONAA 43 (L) 10/08/2021 0328  ? GFRNONAA 34 (L) 10/07/2021 1205  ?  GFRNONAA 45 (L) 06/10/2021 0516  ? GFRAA 43 (L) 08/07/2017 1519  ? GFRAA (L) 01/18/2010 2350  ?  46        ?The eGFR has been calculated ?using the MDRD equation. ?This calculation has not been ?validated in all clinical ?situations. ?eGFR's persistently ?<60 mL/min signify ?possible Chronic Kidney Disease.  ? GFRAA (L) 05/31/2007 1100  ?  55        ?The eGFR has been calculated ?using the MDRD equation. ?This calculation has not been ?validated in all clinical  ? ?CBC ?   ?Component Value Date/Time  ? WBC 5.7 10/08/2021 0413  ? RBC 4.46 10/08/2021 0413  ? HGB 13.2 10/08/2021 0413  ? HCT 40.6 10/08/2021 0413  ? PLT 210 10/08/2021 0413  ? MCV 91.0 10/08/2021 0413  ? MCH 29.6 10/08/2021 0413  ? MCHC 32.5 10/08/2021 0413  ? RDW 12.5 10/08/2021 0413  ? LYMPHSABS 1.3 10/07/2021 1205  ? MONOABS 0.4 10/07/2021 1205  ? EOSABS 0.2 10/07/2021 1205  ? BASOSABS 0.0 10/07/2021 1205  ? ?HEPATIC Function Panel ?Recent Labs  ?  05/29/21 ?5956 06/10/21 ?3875 10/07/21 ?1205  ?PROT 6.2* 6.4* 5.9*  ? ?HEMOGLOBIN A1C ?No components found for: HGA1C,  MPG ?CARDIAC ENZYMES ?Lab Results  ?Component Value Date  ? CKTOTAL 23 (L) 10/07/2021  ? CKMB 1.8 05/29/2007  ? TROPONINI  0.02        ?NO INDICATION OF ?MYOCARDIAL INJURY. 05/28/2007  ? TROPONINI 0.03        ?NO INDICATION OF ?MYOCARDIAL INJURY. 05/27/2007  ? ?BNP ?No results for input(s): PROBNP in the last 8760 hours. ?TSH ?No results for input(s): TSH in the last 8760 hours. ?CHOLESTEROL ?Recent Labs  ?  05/24/21 ?0715  ?CHOL 208*  ? ? ?Scheduled Meds: ? heparin injection (subcutaneous)  5,000 Units Subcutaneous Q8H  ? mouth rinse  15 mL Mouth Rinse BID  ? pantoprazole (PROTONIX) IV  40 mg Intravenous Q24H  ? phenytoin (DILANTIN) IV  75 mg Intravenous Q8H  ? ?Continuous Infusions: ? sodium chloride 50 mL/hr at 10/09/21 0610  ? lacosamide (VIMPAT) IV    ? levETIRAcetam 750 mg (10/09/21 1103)  ? methylPREDNISolone (SOLU-MEDROL) injection    ? ?PRN Meds:.acetaminophen,  LORazepam ? ?Assessment/Plan: ? Seizure disorder, new onset ?Severe AS ?HTN ?Atrial fibrillation ?HLD ?GERD ?Arthritis ?Metabolic acidosis, resolving ? ?Plan:  ?Continue supportive care. ?Solumedrol per critical care. ? ? LOS: 2 days  ? ?Time spent including chart review, lab review, examination, discussion with patient :  min ? ? ?Dixie Dials  MD  ?10/09/2021, 2:00 PM ? ? ? ? ?

## 2021-10-09 NOTE — Progress Notes (Addendum)
?  Transition of Care (TOC) Screening Note ? ? ?Patient Details  ?Name: Sherry [name] ?Date of Birth: January 25, 1920 ? ? ?Transition of Care (TOC) CM/SW Contact:    ?Kermit Balo, RN ?Phone Number: ?10/09/2021, 11:26 AM ? ? ?Patient is from home with family. She is active with [facility] for Home health. ?Transition of Care Department [facility]) has reviewed patient. We will continue to monitor patient advancement through interdisciplinary progression rounds. If new patient transition needs arise, please place a TOC consult. ?  ?

## 2021-10-09 NOTE — Progress Notes (Addendum)
Subjective: Patient continues to have right facial twitching intermittently, worse when stimulated and involves right arm ? ?ROS: Unable to obtain due to poor mental status ? ?Examination ? ?Vital signs in last 24 hours: ?Temp:  [98.8 ?F (37.1 ?C)-100.5 ?F (38.1 ?C)] 98.8 ?F (37.1 ?C) (03/23 1122) ?Pulse Rate:  [68-98] 98 (03/23 1122) ?Resp:  [15-21] 16 (03/23 1122) ?BP: (125-155)/(69-90) 147/76 (03/23 1122) ?SpO2:  [97 %-100 %] 97 % (03/23 1122) ?Weight:  [52.6 kg] 52.6 kg (03/23 0500) ? ?General: lying in bed, NAD ?Neuro: doesn't open eyes to noxious stimuli, doesn't follow commands, PERLA, no gaze deviation, withdraws to noxious stimuli in all extremities R<L, noted to have rhythmic right facial, right, right upper extremity twitching consistent with convulsive status epilepticus ?  ?Basic Metabolic Panel: ?Recent Labs  ?Lab 10/07/21 ?1205 10/07/21 ?1323 10/07/21 ?1354 10/08/21 ?0328  ?NA 143  --  141 139  ?K 3.9  --  3.8 4.8  ?CL 108  --   --  106  ?CO2 21*  --   --  16*  ?GLUCOSE 97  --   --  73  ?BUN 26*  --   --  26*  ?CREATININE 1.37*  --   --  1.13*  ?CALCIUM 9.0  --   --  9.3  ?MG 2.0  --   --   --   ?PHOS  --  3.8  --   --   ? ? ?CBC: ?Recent Labs  ?Lab 10/07/21 ?1205 10/07/21 ?1354 10/08/21 ?0413  ?WBC 3.9*  --  5.7  ?NEUTROABS 2.1  --   --   ?HGB 13.0 13.6 13.2  ?HCT 41.8 40.0 40.6  ?MCV 88.7  --  91.0  ?PLT 233  --  210  ? ? ? ?Coagulation Studies: ?No results for input(s): LABPROT, INR in the last 72 hours. ? ?Imaging ?MRI brain with and without contrast 10/08/2021: No evidence of acute intracranial abnormality. Mild DWI hyperintensity and FLAIR hyperintensity in the left perirolandic cortex, likely residual/artifactual T2 shine through ?from prior left MCA territory infarct (acute on May 25, 2021 ?study). ? ? ? ? ?ASSESSMENT AND PLAN: 88 year old female with past medical history of stroke who presented with right focal motor status epilepticus. ?  ?Focal motor convulsive status epilepticus,  refractory ?Acute encephalopathy, due to seizure ?-LTM EEG continues to show focal convulsive status epilepticus arising from left hemisphere ?-Etiology seizure: Most likely due to underlying chronic infarct.  Unlikely to be infectious as patient continues to be afebrile with no leukocytosis.  Another differential would be autoimmune encephalitis ?  ?Recommendations ?-Continue Keppra 750 mg twice daily, maximal dose for renal impairment ?-Continue phenytoin 75 mg every 8 hours ?-Start Vimpat 100 mg twice daily ?-We will also start Solu-Medrol 1000 mg daily for 3 days for potential autoimmune encephalitis.  Of note, I discussed lumbar puncture with family but they were hesitant due to its invasive nature ?-We will check fingerstick glucose and Protonix while on Solu-Medrol ?-If seizures persist, can consider phenobarbital 250 mg once ?-Patient is DNI.  Therefore we will slowly increase medications so as to minimize sedation and avoid worsening respiratory status. ?-Continue seizure precautions ?-Avoid IV Ativan unless patient has generalized tonic-clonic seizure-like activity ?-Management of rest of comorbidities per primary team ?-Discussed plan with Dr. Doylene Canard.  No family at bedside.  I did attempt to call patient's daughter [name] but phone went to voicemail. ?  ?CRITICAL CARE ?Performed by: Lora Havens ?  ?  ?Total critical care  time: 36 minutes ?  ?Critical care time was exclusive of separately billable procedures and treating other patients. ?  ?Critical care was necessary to treat or prevent imminent or life-threatening deterioration. ?  ?Critical care was time spent personally by me on the following activities: development of treatment plan with patient and/or surrogate as well as nursing, discussions with consultants, evaluation of patient's response to treatment, examination of patient, obtaining history from patient or surrogate, ordering and performing treatments and interventions, ordering and  review of laboratory studies, ordering and review of radiographic studies, pulse oximetry and re-evaluation of patient's condition. ? ? ? ?Zeb Comfort ?Epilepsy ?[facility] ?For questions after 5pm please refer to AMION to reach the Neurologist on call ? ?

## 2021-10-09 NOTE — Progress Notes (Signed)
EEG maintenance performed.  No skin breakdown observed at electrode sites Fp1, Fp2. 

## 2021-10-09 NOTE — Procedures (Addendum)
Patient Name: Sherry [name]  ?MRN: [id number]  ?Epilepsy Attending: Lora Havens  ?Referring Physician/Provider: Gwinda Maine, MD ?Duration: 10/08/2021 1508 to 10/09/2021 1508 ?  ?Patient history: 88 y.o. female PMHx as reviewed below previous history of stroke at baseline is AAOx4 family noticed seizure activity which began at 1030 this morning. EEG to evaluate for seizure ?  ?Level of alertness: lethargic ?  ?AEDs during EEG study: LEV, PHT ?  ?Technical aspects: This EEG study was done with scalp electrodes positioned according to the 10-20 International system of electrode placement. Electrical activity was acquired at a sampling rate of 500Hz  and reviewed with a high frequency filter of 70Hz  and a low frequency filter of 1Hz . EEG data were recorded continuously and digitally stored.  ?  ?Description: EEG showed continuous generalized and lateralized left hemisphere 3-6hz  theta-delta slowing.  EEG showed near continuous 3 to 6 Hz theta with delta slowing admixed with spikes at 1 to 2 Hz in left hemisphere with brief 3 to 5 seconds of EEG attenuation.  Patient was noted to have right facial twitching as well as right upper extremity twitching during the EEG.  This pattern is consistent with focal convulsive status epilepticus.  Hyperventilation and photic stimulation were not performed.    ?  ?Of note, study was technically difficult due to significant chewing artifact.  ?  ?ABNORMALITY ?- Convulsive status epilepticus, left hemisphere ?- Continuous slow, generalized and lateralized left hemisphere ?  ?IMPRESSION: ?This technically difficult study showed evidence of convulsive status epilepticus arising from left hemisphere. Additionally there is moderate diffuse encephalopathy, non specific etiology. ?   ?Lora Havens  ?

## 2021-10-10 ENCOUNTER — Other Ambulatory Visit: Payer: Self-pay

## 2021-10-10 DIAGNOSIS — R569 Unspecified convulsions: Secondary | ICD-10-CM | POA: Diagnosis not present

## 2021-10-10 LAB — GLUCOSE, CAPILLARY
Glucose-Capillary: 128 mg/dL — ABNORMAL HIGH (ref 70–99)
Glucose-Capillary: 114 mg/dL — ABNORMAL HIGH (ref 70–99)
Glucose-Capillary: 107 mg/dL — ABNORMAL HIGH (ref 70–99)
Glucose-Capillary: 120 mg/dL — ABNORMAL HIGH (ref 70–99)
Glucose-Capillary: 142 mg/dL — ABNORMAL HIGH (ref 70–99)

## 2021-10-10 MED ORDER — SODIUM CHLORIDE 0.9 % IV SOLN
250.0000 mg | Freq: Once | INTRAVENOUS | Status: AC
  Administered 2021-10-10: 250 mg via INTRAVENOUS
  Filled 2021-10-10: qty 1.92

## 2021-10-10 MED ORDER — SODIUM CHLORIDE 0.9 % IV SOLN
150.0000 mg | Freq: Two times a day (BID) | INTRAVENOUS | Status: DC
  Administered 2021-10-10 – 2021-10-12 (×6): 150 mg via INTRAVENOUS
  Filled 2021-10-10 (×14): qty 15

## 2021-10-10 NOTE — Progress Notes (Signed)
LTM maint complete - no skin breakdown under Fp1 Fp2 Hair net adjusted [facility] monitored, Event button test confirmed by [facility].  

## 2021-10-10 NOTE — Care Management Important Message (Signed)
Important Message ? ?Patient Details  ?Name: Sherry [name] ?MRN: [id number] ?Date of Birth: 06/28/1920 ? ? ?Medicare Important Message Given:  Yes ? ? ? ? ?Ryanna Teschner ?10/10/2021, 11:38 AM ?

## 2021-10-10 NOTE — Progress Notes (Signed)
?   10/10/21 3474  ?Assess: MEWS Score  ?Temp (!) 97.5 ?F (36.4 ?C)  ?BP 121/73  ?Pulse Rate (!) 55  ?ECG Heart Rate 71  ?Resp 11  ?SpO2 100 %  ?O2 Device Room Air  ?Assess: MEWS Score  ?MEWS Temp 0  ?MEWS Systolic 0  ?MEWS Pulse 0  ?MEWS RR 1  ?MEWS LOC 1  ?MEWS Score 2  ?MEWS Score Color Yellow  ?Assess: if the MEWS score is Yellow or Red  ?Were vital signs taken at a resting state? Yes  ?Focused Assessment Change from prior assessment (see assessment flowsheet)  ?Early Detection of Sepsis Score *See Row Information* Low  ?MEWS guidelines implemented *See Row Information* No, vital signs rechecked  ?Notify: Provider  ?Provider Name/Title Dr. Algie Coffer  ?Date Provider Notified 10/10/21  ?Time Provider Notified 0930  ?Notification Type Page  ?Notification Reason Other (Comment) ?(yellow MEWS score 2)  ?Date of Provider Response 10/10/21  ?Time of Provider Response 1000  ?Notify: Rapid Response  ?Name of Rapid Response RN Notified Dow Adolph, RN Engineer, manufacturing systems)  ?Date Rapid Response Notified 10/10/21  ?Time Rapid Response Notified 0900  ? ? ?

## 2021-10-10 NOTE — Procedures (Addendum)
Patient Name: Sherry [name]  ?MRN: [id number]  ?Epilepsy Attending: Lora Havens  ?Referring Physician/Provider: Gwinda Maine, MD ?Duration: 10/09/2021 1508 to 10/10/2021 1508 ?  ?Patient history: 88 y.o. female PMHx as reviewed below previous history of stroke at baseline is AAOx4 family noticed seizure activity which began at 1030 this morning. EEG to evaluate for seizure ?  ?Level of alertness: lethargic ?  ?AEDs during EEG study: LEV, PHT, LCM, Solumedrole ?  ?Technical aspects: This EEG study was done with scalp electrodes positioned according to the 10-20 International system of electrode placement. Electrical activity was acquired at a sampling rate of 500Hz  and reviewed with a high frequency filter of 70Hz  and a low frequency filter of 1Hz . EEG data were recorded continuously and digitally stored.  ?  ?Description: EEG showed continuous generalized and lateralized left hemisphere 3-6hz  theta-delta slowing.  EEG showed near continuous 3 to 6 Hz theta with delta slowing admixed with spikes at 1 to 2 Hz in left hemisphere with brief 3 to 5 seconds of EEG attenuation.  Patient was noted to have right facial twitching as well as right upper extremity twitching during the EEG.  This pattern is consistent with focal convulsive status epilepticus.  Hyperventilation and photic stimulation were not performed.    ?  ?Of note, study was technically difficult due to significant chewing artifact.  ?  ?ABNORMALITY ?- Convulsive status epilepticus, left hemisphere ?- Continuous slow, generalized and lateralized left hemisphere ?  ?IMPRESSION: ?This technically difficult study showed evidence of convulsive status epilepticus arising from left hemisphere. Additionally there is moderate diffuse encephalopathy, non specific etiology. ?   ?Lora Havens  ?

## 2021-10-10 NOTE — Progress Notes (Addendum)
Subjective: NAEO. More awake, attempts to interact but keeps repeating "yes". Stated she was too hot ( is afebrile) ? ?ROS: Unable to obtain due to poor mental status ? ?Examination ? ?Vital signs in last 24 hours: ?Temp:  [97.5 ?F (36.4 ?C)-99.4 ?F (37.4 ?C)] 97.6 ?F (36.4 ?C) (03/24 [id number]) ?Pulse Rate:  [55-100] 66 (03/24 0834) ?Resp:  [11-19] 16 (03/24 0834) ?BP: (111-159)/(73-90) 132/77 (03/24 0834) ?SpO2:  [97 %-100 %] 98 % (03/24 0834) ? ?General: lying in bed, NAD  ?Neuro: awake, not oriented, doesn't follow commands, keeps saying "yes", PERLA, tracks examiner, spontaneously moving all extremities but appears to have right hemiparesis, rhythmic right facial, right, right upper extremity twitching consistent with convulsive status epilepticus ? ?Basic Metabolic Panel: ?Recent Labs  ?Lab 10/07/21 ?1205 10/07/21 ?1323 10/07/21 ?1354 10/08/21 ?0328  ?NA 143  --  141 139  ?K 3.9  --  3.8 4.8  ?CL 108  --   --  106  ?CO2 21*  --   --  16*  ?GLUCOSE 97  --   --  73  ?BUN 26*  --   --  26*  ?CREATININE 1.37*  --   --  1.13*  ?CALCIUM 9.0  --   --  9.3  ?MG 2.0  --   --   --   ?PHOS  --  3.8  --   --   ? ? ?CBC: ?Recent Labs  ?Lab 10/07/21 ?1205 10/07/21 ?1354 10/08/21 ?0413  ?WBC 3.9*  --  5.7  ?NEUTROABS 2.1  --   --   ?HGB 13.0 13.6 13.2  ?HCT 41.8 40.0 40.6  ?MCV 88.7  --  91.0  ?PLT 233  --  210  ? ? ? ?Coagulation Studies: ?No results for input(s): LABPROT, INR in the last 72 hours. ? ?Imaging ?No new brain imaging ? ?ASSESSMENT AND PLAN: 88 year old female with past medical history of stroke who presented with right focal motor status epilepticus. ?  ?Focal motor convulsive status epilepticus, refractory ?Acute encephalopathy, due to seizure ?-LTM EEG continues to show focal convulsive status epilepticus arising from left hemisphere ?-Etiology seizure: Most likely due to underlying chronic infarct.  Unlikely to be infectious as patient continues to be afebrile with no leukocytosis.  Another differential would  be autoimmune encephalitis ?- Of note, patient is more awake since we started solumedrol. Still not following commands ?  ?Recommendations ?-Continue Keppra 750 mg twice daily, maximal dose for renal impairment ?-Continue phenytoin 75 mg every 8 hours ?-Increase Vimpat to 150 mg twice daily ?- Will give one time dose of phenobarb 250mg  once ?-On Solu-Medrol 1000 mg daily D2/3 for potential autoimmune encephalitis.  Of note, I discussed lumbar puncture with family but they were hesitant due to its invasive nature ?-We will check fingerstick glucose and Protonix while on Solu-Medrol ?-If seizures persist, can consider adding perampanel 8mg  once ?-Patient is DNI.  Therefore we will slowly increase medications so as to minimize sedation and avoid worsening respiratory status. ?-Continue seizure precautions ?-Avoid IV Ativan unless patient has generalized tonic-clonic seizure-like activity ?-Management of rest of comorbidities per primary team ?  ?CRITICAL CARE ?Performed by: Lora Havens ?  ?  ?Total critical care time: 36 minutes ?  ?Critical care time was exclusive of separately billable procedures and treating other patients. ?  ?Critical care was necessary to treat or prevent imminent or life-threatening deterioration. ?  ?Critical care was time spent personally by me on the following activities: development of treatment plan  with patient and/or surrogate as well as nursing, discussions with consultants, evaluation of patient's response to treatment, examination of patient, obtaining history from patient or surrogate, ordering and performing treatments and interventions, ordering and review of laboratory studies, ordering and review of radiographic studies, pulse oximetry and re-evaluation of patient's condition. ?  ? ?Zeb Comfort ?Epilepsy ?[facility] ?For questions after 5pm please refer to AMION to reach the Neurologist on call ? ?

## 2021-10-10 NOTE — Progress Notes (Signed)
Ref: Dixie Dials, MD ? ? ?Subjective:  ?Opens eyes for the first time. ?She is not quite tracking or responds to calling her or tapping her shoulder. ?VS are stable. ?Status epilepticus continues. ? ?Objective:  ?Vital Signs in the last 24 hours: ?Temp:  [97.5 ?F (36.4 ?C)-99.4 ?F (37.4 ?C)] 97.6 ?F (36.4 ?C) (03/24 0354) ?Pulse Rate:  [55-100] 66 (03/24 0834) ?Cardiac Rhythm: Atrial fibrillation (03/24 0834) ?Resp:  [11-19] 16 (03/24 0834) ?BP: (111-159)/(73-90) 132/77 (03/24 0834) ?SpO2:  [97 %-100 %] 98 % (03/24 0834) ? ?Physical Exam: ?BP Readings from Last 1 Encounters:  ?10/10/21 132/77  ?   ?Wt Readings from Last 1 Encounters:  ?10/09/21 52.6 kg  ?  Weight change:  Body mass index is 21.91 kg/m?. ?HEENT: [place]/AT, Eyes-Brown, Conjunctiva-Pink, Sclera-Non-icteric ?Neck: No JVD, No bruit, Trachea midline. ?Lungs:  Clear, Bilateral. ?Cardiac:  Regular rhythm, normal S1 and S2, no S3. II/VI systolic murmur. ?Abdomen:  Soft, non-tender. BS present. ?Extremities:  No edema present. No cyanosis. No clubbing. ?CNS: AxOx0, Cranial nerves grossly intact.  ?Skin: Warm and dry. ? ? ?Intake/Output from previous day: ?03/23 0701 - 03/24 0700 ?In: 2104.2 [I.V.:1913.2; IV Piggyback:191] ?Out: -  ? ? ? ?Lab Results: ?BMET ?   ?Component Value Date/Time  ? NA 139 10/08/2021 0328  ? NA 141 10/07/2021 1354  ? NA 143 10/07/2021 1205  ? K 4.8 10/08/2021 0328  ? K 3.8 10/07/2021 1354  ? K 3.9 10/07/2021 1205  ? CL 106 10/08/2021 0328  ? CL 108 10/07/2021 1205  ? CL 100 06/10/2021 0516  ? CO2 16 (L) 10/08/2021 0328  ? CO2 21 (L) 10/07/2021 1205  ? CO2 25 06/10/2021 0516  ? GLUCOSE 73 10/08/2021 0328  ? GLUCOSE 97 10/07/2021 1205  ? GLUCOSE 94 06/10/2021 0516  ? BUN 26 (H) 10/08/2021 0328  ? BUN 26 (H) 10/07/2021 1205  ? BUN 10 06/10/2021 0516  ? CREATININE 1.13 (H) 10/08/2021 0328  ? CREATININE 1.37 (H) 10/07/2021 1205  ? CREATININE 1.10 (H) 06/10/2021 0516  ? CALCIUM 9.3 10/08/2021 0328  ? CALCIUM 9.0 10/07/2021 1205  ? CALCIUM 9.1  06/10/2021 0516  ? GFRNONAA 43 (L) 10/08/2021 0328  ? GFRNONAA 34 (L) 10/07/2021 1205  ? GFRNONAA 45 (L) 06/10/2021 0516  ? GFRAA 43 (L) 08/07/2017 1519  ? GFRAA (L) 01/18/2010 2350  ?  46        ?The eGFR has been calculated ?using the MDRD equation. ?This calculation has not been ?validated in all clinical ?situations. ?eGFR's persistently ?<60 mL/min signify ?possible Chronic Kidney Disease.  ? GFRAA (L) 05/31/2007 1100  ?  55        ?The eGFR has been calculated ?using the MDRD equation. ?This calculation has not been ?validated in all clinical  ? ?CBC ?   ?Component Value Date/Time  ? WBC 5.7 10/08/2021 0413  ? RBC 4.46 10/08/2021 0413  ? HGB 13.2 10/08/2021 0413  ? HCT 40.6 10/08/2021 0413  ? PLT 210 10/08/2021 0413  ? MCV 91.0 10/08/2021 0413  ? MCH 29.6 10/08/2021 0413  ? MCHC 32.5 10/08/2021 0413  ? RDW 12.5 10/08/2021 0413  ? LYMPHSABS 1.3 10/07/2021 1205  ? MONOABS 0.4 10/07/2021 1205  ? EOSABS 0.2 10/07/2021 1205  ? BASOSABS 0.0 10/07/2021 1205  ? ?HEPATIC Function Panel ?Recent Labs  ?  05/29/21 ?6568 06/10/21 ?1275 10/07/21 ?1205  ?PROT 6.2* 6.4* 5.9*  ? ?HEMOGLOBIN A1C ?No components found for: HGA1C,  MPG ?CARDIAC ENZYMES ?Lab Results  ?  Component Value Date  ? CKTOTAL 23 (L) 10/07/2021  ? CKMB 1.8 05/29/2007  ? TROPONINI 0.02        ?NO INDICATION OF ?MYOCARDIAL INJURY. 05/28/2007  ? TROPONINI 0.03        ?NO INDICATION OF ?MYOCARDIAL INJURY. 05/27/2007  ? ?BNP ?No results for input(s): PROBNP in the last 8760 hours. ?TSH ?No results for input(s): TSH in the last 8760 hours. ?CHOLESTEROL ?Recent Labs  ?  05/24/21 ?0715  ?CHOL 208*  ? ? ?Scheduled Meds: ? heparin injection (subcutaneous)  5,000 Units Subcutaneous Q8H  ? mouth rinse  15 mL Mouth Rinse BID  ? pantoprazole (PROTONIX) IV  40 mg Intravenous Q24H  ? phenytoin (DILANTIN) IV  75 mg Intravenous Q8H  ? ?Continuous Infusions: ? sodium chloride 50 mL/hr at 10/10/21 0158  ? lacosamide (VIMPAT) IV    ? levETIRAcetam 750 mg (10/10/21 1038)  ?  methylPREDNISolone (SOLU-MEDROL) injection 1,000 mg (10/09/21 1456)  ? PHENObarbital    ? ?PRN Meds:.acetaminophen, LORazepam ? ?Assessment/Plan: ? Status epilepticus ?Severe AS ?HTN ?Atrial fibrillation ?HLD ?GERD ?Arthritis ?Metabolic acidosis  ?CKD II ? ?Plan: ?Continue supportive care. ?Continue to follow neurology. ? ? LOS: 3 days  ? ?Time spent including chart review, lab review, examination, discussion with patient : 30 min ? ? ?Dixie Dials  MD  ?10/10/2021, 11:27 AM ? ? ? ? ?

## 2021-10-11 LAB — GLUCOSE, CAPILLARY
Glucose-Capillary: 118 mg/dL — ABNORMAL HIGH (ref 70–99)
Glucose-Capillary: 127 mg/dL — ABNORMAL HIGH (ref 70–99)
Glucose-Capillary: 108 mg/dL — ABNORMAL HIGH (ref 70–99)
Glucose-Capillary: 106 mg/dL — ABNORMAL HIGH (ref 70–99)
Glucose-Capillary: 124 mg/dL — ABNORMAL HIGH (ref 70–99)

## 2021-10-11 MED ORDER — PERAMPANEL 8 MG PO TABS: 8.0000 mg | ORAL_TABLET | Freq: Once | ORAL | Status: DC

## 2021-10-11 NOTE — Procedures (Addendum)
Patient Name: Sherry [name]  ?MRN: [id number]  ?Epilepsy Attending: Charlsie Quest  ?Referring Physician/Provider: Rica Mote, MD ?Duration: 10/10/2021 1508 to 10/11/2021 1508 ?  ?Patient history: 88 y.o. female PMHx as reviewed below previous history of stroke at baseline is AAOx4 family noticed seizure activity which began at 1030 this morning. EEG to evaluate for seizure ?  ?Level of alertness: lethargic ?  ?AEDs during EEG study: LEV, PHT, LCM, Solumedrol ?  ?Technical aspects: This EEG study was done with scalp electrodes positioned according to the 10-20 International system of electrode placement. Electrical activity was acquired at a sampling rate of 500Hz  and reviewed with a high frequency filter of 70Hz  and a low frequency filter of 1Hz . EEG data were recorded continuously and digitally stored.  ?  ?Description: EEG showed continuous generalized and lateralized left hemisphere 3-6hz  theta-delta slowing. Lateralized periodic discharges with overriding fast activity was noted in left hemisphere at 0.5-1hz . Seizures were noted, average 3/hour, average duration 2.5 minutes. No clinical signs were seen during seizures when patient was asleep. When  patient was awake, at times right facial twitching was noted with seizures.  Hyperventilation and photic stimulation were not performed.    ?  ?ABNORMALITY ?- Seizure without clinical signs, left hemisphere ?- Lateralized periodic discharges with overriding fast activity (LPD+F), left hemisphere ?- Continuous slow, generalized and lateralized left hemisphere ?  ?IMPRESSION: ?This study showed seizure without clinical signs arising from left hemisphere,average 3/hour, average duration 2.5 minutes. Lateralized periodic discharges with overriding fast activity was also noted in left hemisphere likely due to underlying structural abnormality and indicative if high potential for seizure recurrence. Additionally there is moderate diffuse encephalopathy, non  specific etiology. ? ?EEG appears to be improving compared to previous day.  ?   ?  ?

## 2021-10-11 NOTE — Progress Notes (Signed)
Subjective:  ?Responds to verbal command states feel better today.  No further seizure activity noted. ? ?Objective:  ?Vital Signs in the last 24 hours: ?Temp:  [97.5 ?F (36.4 ?C)-97.7 ?F (36.5 ?C)] 97.7 ?F (36.5 ?C) (03/25 0355) ?Pulse Rate:  [30-116] 64 (03/25 0758) ?Resp:  [0-26] 12 (03/25 0758) ?BP: (117-151)/(62-110) 117/64 (03/24 2340) ?SpO2:  [98 %-100 %] 100 % (03/24 2340) ? ?Intake/Output from previous day: ?03/24 0701 - 03/25 0700 ?In: 692.1 [I.V.:370.1; IV Piggyback:322] ?Out: 400 [Urine:400] ?Intake/Output from this shift: ?No intake/output data recorded. ? ?Physical Exam: ?Neck: no adenopathy, no carotid bruit, no JVD, and supple, symmetrical, trachea midline ?Lungs: Clear to auscultation anterolaterally ?Heart: regular rate and rhythm, S1, S2 normal, and 2/6 systolic murmur noted ?Abdomen: soft, non-tender; bowel sounds normal; no masses,  no organomegaly ?Extremities: extremities normal, atraumatic, no cyanosis or edema ? ?Lab Results: ?No results for input(s): WBC, HGB, PLT in the last 72 hours. ?No results for input(s): NA, K, CL, CO2, GLUCOSE, BUN, CREATININE in the last 72 hours. ?No results for input(s): TROPONINI in the last 72 hours. ? ?Invalid input(s): CK, MB ?Hepatic Function Panel ?Recent Labs  ?  10/09/21 ?0112  ?ALBUMIN 3.3*  ? ?No results for input(s): CHOL in the last 72 hours. ?No results for input(s): PROTIME in the last 72 hours. ? ?Imaging: ?Imaging results have been reviewed and No results found. ? ?Cardiac Studies: ? ?Assessment/Plan:  ?Status post right focal motor status epilepticus ?Severe AS ?HTN ?Atrial fibrillation ?HLD ?GERD ?Arthritis ?Metabolic acidosis  ?CKD II ?Plan ?Continue present management ?Out of bed as tolerated ? ? LOS: 4 days  ? ? ?Rinaldo Cloud ?10/11/2021, 11:19 AM ? ? ? ?

## 2021-10-11 NOTE — Progress Notes (Signed)
Subjective: NAEO. More awake, attempts to interact but keeps repeating "yes". Stated she was too hot ( is afebrile) ? ?ROS: Unable to obtain due to poor mental status ? ?Examination ? ?Vital signs in last 24 hours: ?Temp:  [97.5 ?F (36.4 ?C)-97.7 ?F (36.5 ?C)] 97.7 ?F (36.5 ?C) (03/25 [id number]) ?Pulse Rate:  [30-116] 64 (03/25 0758) ?Resp:  [0-29] 12 (03/25 0758) ?BP: (117-151)/(62-110) 117/64 (03/24 2340) ?SpO2:  [57 %-100 %] 100 % (03/24 2340) ? ? ?Exam remains unchanged compared to yesterday. ?General: lying in bed, NAD  ?Neuro: awake, not oriented, doesn't follow commands, keeps saying "yes", PERLA, tracks examiner, spontaneously moving all extremities but appears to have right hemiparesis, rhythmic right facial and right upper extremity twitching consistent with convulsive status epilepticus ? ?No new labs. ? ?Imaging ?No new brain imaging ? ?cEEG 10/10/2021 to 10/11/2021: showed subclinical seizures arising from left hemisphere, average 3/hour, average duration 2.5 minutes. Lateralized periodic discharges with overriding fast activity (LPD+F) was also noted in left hemisphere likely due to underlying structural abnormality and indicative if high potential for seizure recurrence. Additionally there is moderate diffuse encephalopathy, non specific etiology. ? ?ASSESSMENT AND PLAN: 88 year old female with past medical history of stroke who presented with right focal motor status epilepticus. Though her cEEG is improved compared to prior study, it continues to show focal non-convulsive status epilepticus arising from left hemisphere with intervening periods of LPD+F which suggests highly epileptogenic potential. Exam revealed continued synchronous right face and right upper extremity twitching consistent with convulsive status epilepticus. ? ?  ?Focal motor convulsive status epilepticus, refractory ?Acute encephalopathy, due to seizure ?Etiology seizure: Most likely due to underlying chronic infarct.  Unlikely to be  infectious as patient continues to be afebrile with no leukocytosis.  Another differential would be autoimmune encephalitis. ?Received one time dose phenobarbital 250mg  ?Patient became more awake since starting solumedrol. Still not following commands ?  ?Recommendations ?-Seizures continue to persist and will add perampanel 8mg  once as planned. ?-Continue Keppra 750 mg twice daily, maximal dose for renal impairment ?-Continue phenytoin 75 mg every 8 hours ?-Continue Vimpat to 150 mg twice daily ?-Continue Solu-Medrol 1000 mg Day 3/3 for potential autoimmune encephalitis.  Of note, I discussed lumbar puncture with family but they were hesitant due to its invasive nature ?-We will check fingerstick glucose and Protonix while on Solu-Medrol ?-Patient is DNI. Therefore we will slowly increase medications so as to minimize sedation and avoid worsening respiratory status. ?-Continue seizure precautions ?-Avoid IV Ativan unless patient has generalized tonic-clonic seizure-like activity ?-Management of rest of comorbidities per primary team ?  ?CRITICAL CARE ?Performed by: Lynnae Sandhoff ?  ?  ?Total critical care time: 45 minutes ?  ?Critical care time was exclusive of separately billable procedures and treating other patients. ?  ?Critical care was necessary to treat or prevent imminent or life-threatening deterioration. ?  ?Critical care was time spent personally by me on the following activities: development of treatment plan with patient and/or surrogate as well as nursing, discussions with consultants, evaluation of patient's response to treatment, examination of patient, obtaining history from patient or surrogate, ordering and performing treatments and interventions, ordering and review of laboratory studies, ordering and review of radiographic studies, pulse oximetry and re-evaluation of patient's condition. ?  ? ? ?Electronically signed by:  ?Lynnae Sandhoff, MD ?Page: [id number] ?10/11/2021, 8:25 AM ? ?For  questions after 7pm please refer to AMION to reach the Neurologist on call ? ?

## 2021-10-11 NOTE — Plan of Care (Signed)
Pt still remains the same.  Occasionally opens eyes, but no speech.  No purposeful interaction with staff during this shift.   ? ?Problem: Nutrition: ?Goal: Adequate nutrition will be maintained ?10/11/2021 0645 by Dorthy Cooler, RN ?Outcome: Not Progressing ?10/11/2021 0644 by Dorthy Cooler, RN ?Outcome: Progressing ?  ?Problem: Elimination: ?Goal: Will not experience complications related to bowel motility ?10/11/2021 0645 by Dorthy Cooler, RN ?Outcome: Not Progressing ?10/11/2021 0644 by Dorthy Cooler, RN ?Outcome: Progressing ?  ?

## 2021-10-12 LAB — GLUCOSE, CAPILLARY
Glucose-Capillary: 117 mg/dL — ABNORMAL HIGH (ref 70–99)
Glucose-Capillary: 126 mg/dL — ABNORMAL HIGH (ref 70–99)
Glucose-Capillary: 99 mg/dL (ref 70–99)
Glucose-Capillary: 92 mg/dL (ref 70–99)

## 2021-10-12 LAB — CULTURE, BLOOD (ROUTINE X 2): Culture: NO GROWTH

## 2021-10-12 MED ORDER — GABAPENTIN 300 MG PO CAPS
300.0000 mg | ORAL_CAPSULE | Freq: Three times a day (TID) | ORAL | Status: DC
Start: 2021-10-12 — End: 2021-10-13
  Administered 2021-10-12: 300 mg via ORAL
  Filled 2021-10-12 (×2): qty 1

## 2021-10-12 NOTE — Progress Notes (Signed)
Subjective:  ?Appreciate neurology consult and help ?Patient groggy and sleepy states feels about the same getting EEG. ? ?Objective:  ?Vital Signs in the last 24 hours: ?Temp:  [97.6 ?F (36.4 ?C)-98.4 ?F (36.9 ?C)] 98.4 ?F (36.9 ?C) (03/26 [id number]) ?Pulse Rate:  [59-77] 68 (03/26 0718) ?Resp:  [9-17] 16 (03/26 0718) ?BP: (105-140)/(55-93) 118/73 (03/26 [id number]) ?SpO2:  [99 %-100 %] 100 % (03/26 0718) ? ?Intake/Output from previous day: ?03/25 0701 - 03/26 0700 ?In: 1934 [I.V.:1425.3; IV Piggyback:508.8] ?Out: 350 [Urine:350] ?Intake/Output from this shift: ?No intake/output data recorded. ? ?Physical Exam: ?Exam unchanged ? ?Lab Results: ?No results for input(s): WBC, HGB, PLT in the last 72 hours. ?No results for input(s): NA, K, CL, CO2, GLUCOSE, BUN, CREATININE in the last 72 hours. ?No results for input(s): TROPONINI in the last 72 hours. ? ?Invalid input(s): CK, MB ?Hepatic Function Panel ?No results for input(s): PROT, ALBUMIN, AST, ALT, ALKPHOS, BILITOT, BILIDIR, IBILI in the last 72 hours. ?No results for input(s): CHOL in the last 72 hours. ?No results for input(s): PROTIME in the last 72 hours. ? ?Imaging: ?Imaging results have been reviewed and No results found. ? ?Cardiac Studies: ? ?Assessment/Plan:  ?Status post right focal motor status epilepticus ?Severe AS ?HTN ?Atrial fibrillation ?HLD ?GERD ?Arthritis ?Metabolic acidosis  ?CKD II ?Plan ?Continue present management as per neurology  ? LOS: 5 days  ? ? ?Charolette Forward ?10/12/2021, 10:07 AM ? ? ? ?

## 2021-10-12 NOTE — Progress Notes (Signed)
Subjective: Sherry [name]. More awake, attempts to interact. ? ?ROS: Unable to obtain due to poor mental status ? ?Examination ? ?Vital signs in last 24 hours: ?Temp:  [97.6 ?F (36.4 ?C)-98.4 ?F (36.9 ?C)] 98.1 ?F (36.7 ?C) (03/26 1111) ?Pulse Rate:  [59-102] 102 (03/26 1111) ?Resp:  [9-17] 13 (03/26 1111) ?BP: (105-155)/(55-80) 155/80 (03/26 1111) ?SpO2:  [99 %-100 %] 100 % (03/26 0718) ? ? ?Exam remains unchanged compared to yesterday. ?General: lying in bed, NAD  ?Neuro: awake, not oriented, doesn't follow commands, keeps saying "yes", PERLA, tracks examiner, spontaneously moving all extremities but appears to have right hemiparesis, rhythmic right facial and right upper extremity twitching consistent with convulsive status epilepticus ? ?No new labs. ? ?Imaging ?No new brain imaging ? ?cEEG 10/11/2021 to 10/12/2021: showed subclinical seizures arising from left hemisphere, average 3/hour, average duration 1.5 minutes. Lateralized periodic discharges with overriding fast activity (LPD+F) was also noted in left hemisphere likely due to underlying structural abnormality and indicative if high potential for seizure recurrence. Additionally there is moderate diffuse encephalopathy, non specific etiology. ? ?ASSESSMENT AND PLAN: 88 year old female with past medical history of stroke who presented with right focal motor status epilepticus. cEEG continues to show focal non-convulsive status epilepticus arising from left hemisphere with intervening periods of LPD+F which suggests highly epileptogenic potential. Exam revealed continued synchronous right face and right upper extremity twitching consistent with convulsive status epilepticus. ? ?Thus far has not had response to PHT, PB, LEV, LCM, PER ? ?  ?Focal motor convulsive status epilepticus, refractory ?Acute encephalopathy, due to seizure ?Etiology seizure: Most likely due to underlying chronic infarct.  Unlikely to be infectious as patient continues to be afebrile with no  leukocytosis.  Another differential would be autoimmune encephalitis however no clearly sustained improvement after 3 days of high dose methylprednisolone. ?Received one time dose phenobarbital 250mg  ?Patient briefly became more awake after starting solumedrol. Still not following commands. ?  ?Recommendations ?-Seizures continue to persist despite one time dose of perampanel 8mg  and will trial gabapentin 300mg  TID and observe EEG for changes. ?-Continue Keppra 750 mg twice daily, maximal dose for renal impairment ?-Continue phenytoin 75 mg every 8 hours ?-Continue Vimpat to 150 mg twice daily ?-Patient is DNI. Therefore we will slowly increase medications so as to minimize sedation and avoid worsening respiratory status. ?-Continue seizure precautions ?-Avoid IV Ativan unless patient has generalized tonic-clonic seizure-like activity ?-Management of rest of comorbidities per primary team ?  ?CRITICAL CARE ?Performed by: Lynnae Sandhoff ?  ?Total critical care time: 30 minutes ?  ?Critical care time was exclusive of separately billable procedures and treating other patients. ?  ?Critical care was necessary to treat or prevent imminent or life-threatening deterioration. ?  ?Critical care was time spent personally by me on the following activities: development of treatment plan with patient and/or surrogate as well as nursing, discussions with consultants, evaluation of patient's response to treatment, examination of patient, obtaining history from patient or surrogate, ordering and performing treatments and interventions, ordering and review of laboratory studies, ordering and review of radiographic studies, pulse oximetry and re-evaluation of patient's condition. ?  ?Electronically signed by:  ?Lynnae Sandhoff, MD ?Page: [id number] ?10/12/2021, 12:15 PM ? ?For questions after 7pm please refer to AMION to reach the Neurologist on call ? ?

## 2021-10-12 NOTE — Procedures (Addendum)
Patient Name: Sherry [name]  ?MRN: [id number]  ?Epilepsy Attending: Lora Havens  ?Referring Physician/Provider: Gwinda Maine, MD ?Duration: 10/11/2021 1508 to 10/12/2021 1508 ?  ?Patient history: 88 y.o. female PMHx as reviewed below previous history of stroke at baseline is AAOx4 family noticed seizure activity which began at 1030 this morning. EEG to evaluate for seizure ?  ?Level of alertness: lethargic ?  ?AEDs during EEG study: LEV, PHT, LCM ?  ?Technical aspects: This EEG study was done with scalp electrodes positioned according to the 10-20 International system of electrode placement. Electrical activity was acquired at a sampling rate of 500Hz  and reviewed with a high frequency filter of 70Hz  and a low frequency filter of 1Hz . EEG data were recorded continuously and digitally stored.  ?  ?Description: EEG showed continuous generalized and lateralized left hemisphere 3-6hz  theta-delta slowing. Lateralized periodic discharges with overriding fast activity was noted in left hemisphere at 0.5-1hz . Seizures were noted, average 3/hour, average duration 1.5 minutes. No clinical signs were seen during seizures when patient was asleep. When  patient was awake, at times right facial twitching was noted with seizures.  Hyperventilation and photic stimulation were not performed.    ?  ?ABNORMALITY ?- Seizure without clinical signs, left hemisphere ?- Lateralized periodic discharges with overriding fast activity (LPD+F), left hemisphere ?- Continuous slow, generalized and lateralized left hemisphere ?  ?IMPRESSION: ?This study showed seizure without clinical signs arising from left hemisphere,average 3/hour, average duration 1.5 minutes. Lateralized periodic discharges with overriding fast activity was also noted in left hemisphere likely due to underlying structural abnormality and indicative if high potential for seizure recurrence. Additionally there is moderate diffuse encephalopathy, non specific  etiology. ?  ?Lora Havens  ?

## 2021-10-12 NOTE — Progress Notes (Signed)
Maint complete. Skin check performed under O1, P3, Pz. No break down.  ?

## 2021-10-13 LAB — GLUCOSE, CAPILLARY
Glucose-Capillary: 93 mg/dL (ref 70–99)
Glucose-Capillary: 87 mg/dL (ref 70–99)
Glucose-Capillary: 67 mg/dL — ABNORMAL LOW (ref 70–99)
Glucose-Capillary: 71 mg/dL (ref 70–99)
Glucose-Capillary: 69 mg/dL — ABNORMAL LOW (ref 70–99)

## 2021-10-13 MED ORDER — LORAZEPAM 2 MG/ML IJ SOLN
1.0000 mg | Freq: Four times a day (QID) | INTRAMUSCULAR | Status: DC
  Administered 2021-10-13 – 2021-10-15 (×6): 1 mg via INTRAMUSCULAR
  Filled 2021-10-13 (×6): qty 1

## 2021-10-13 NOTE — Progress Notes (Signed)
Algie Coffer, MD made away that IV access is unable to be established by bedside nurse, or two members of the IV team.  Made aware IV keppra, IV vimpat, and IV fluids are not being given and patient unable to take POs.  Algie Coffer, MD in route to bedside per phone conversation ?

## 2021-10-13 NOTE — Progress Notes (Signed)
Subjective: Patient's EEG has worsened overnight.  She also lost her IV access and the team has been struggling to find another IV access.  She has not been able to take her p.o. medications due to excessive drowsiness.  Daughter at bedside states patient would not have wanted even a temporary NG tube. ? ?ROS: Unable to obtain due to poor mental status ? ?Examination ? ?Vital signs in last 24 hours: ?Temp:  [97.6 ?F (36.4 ?C)-98.4 ?F (36.9 ?C)] 97.6 ?F (36.4 ?C) (03/27 0737) ?Pulse Rate:  [46-102] 64 (03/27 0745) ?Resp:  [12-16] 14 (03/27 0745) ?BP: (124-155)/(65-84) 149/78 (03/27 0737) ?SpO2:  [99 %-100 %] 99 % (03/27 0745) ? ?General: lying in bed, NAD  ?Neuro: Opens eyes to repeated tactile stimuli and looks at examiner but immediately falls back asleep, PERRLA, no forced gaze deviation, withdraws to noxious stimuli in all 4 extremities with right hemiparesis ? ?Basic Metabolic Panel: ?Recent Labs  ?Lab 10/07/21 ?1205 10/07/21 ?1323 10/07/21 ?1354 10/08/21 ?0328  ?NA 143  --  141 139  ?K 3.9  --  3.8 4.8  ?CL 108  --   --  106  ?CO2 21*  --   --  16*  ?GLUCOSE 97  --   --  73  ?BUN 26*  --   --  26*  ?CREATININE 1.37*  --   --  1.13*  ?CALCIUM 9.0  --   --  9.3  ?MG 2.0  --   --   --   ?PHOS  --  3.8  --   --   ? ? ?CBC: ?Recent Labs  ?Lab 10/07/21 ?1205 10/07/21 ?1354 10/08/21 ?0413  ?WBC 3.9*  --  5.7  ?NEUTROABS 2.1  --   --   ?HGB 13.0 13.6 13.2  ?HCT 41.8 40.0 40.6  ?MCV 88.7  --  91.0  ?PLT 233  --  210  ? ? ? ?Coagulation Studies: ?No results for input(s): LABPROT, INR in the last 72 hours. ? ?Imaging ?No new brain imaging overnight ? ? ?ASSESSMENT AND PLAN: 88 year old female with past medical history of stroke who presented with right focal motor status epilepticus. ?  ?Focal motor convulsive status epilepticus, refractory ?Acute encephalopathy, due to seizure ?-LTM EEG continues to show focal status epilepticus arising from left hemisphere ?-Etiology seizure: Most likely due to underlying chronic  infarct.  Unlikely to be infectious as patient continues to be afebrile with no leukocytosis.  I did not respond to IV steroids therefore unlikely to be autoimmune ?  ?Recommendations ?-Patient is currently on IV Keppra, IV Vimpat and IV phenytoin.  However, she lost IV access this morning.  ?- Perampanel was ordered but not administered as patient could not take it p.o.   ?-Gabapentin was also started but again has not been administered consistently due to lack of p.o. access.   ?-Patient's family states patient would not have wanted an NG tube even temporarily. ?-I discussed with patient's daughter that she has been in status epilepticus for almost 6 days now without significant improvement he has been getting more drowsy.  At this point, I can continue to add more anti-seizure medications but she would need an NG tube to safely administer those medications.  However, patient did not want any tubes.  Therefore the alternative would be comfort care. ?-Discussed about palliative care consult and potentially home hospice ?-If palliative care team talks to family and family agrees for home hospice, will discontinue LTM EEG. ?-Ideally, would prefer patient to be  discharged on some antiseizure medications to avoid worsening with generalized tonic-clonic seizures which can be uncomfortable. If no safe p.o. access, could consider rectal diazepam. ?-Management of rest of comorbidities per primary team ?-Plan discussed with Dr. Doylene Canard, daughter at bedside ?  ?I have spent a total of  55  minutes with the patient reviewing hospital notes,  test results, labs and examining the patient as well as establishing an assessment and plan that was discussed personally with the patient's daughter at bedside.  > 50% of time was spent in direct patient care. ?  ? ? ?Zeb Comfort ?Epilepsy ?[facility] ?For questions after 5pm please refer to AMION to reach the Neurologist on call ? ?

## 2021-10-13 NOTE — Plan of Care (Signed)
?  Problem: Health Behavior/Discharge Planning: ?Goal: Ability to manage health-related needs will improve ?Outcome: Not Progressing ?  ?Problem: Education: ?Goal: Knowledge of General Education information will improve ?Description: Including pain rating scale, medication(s)/side effects and non-pharmacologic comfort measures ?Outcome: Not Progressing ?  ?Problem: Activity: ?Goal: Risk for activity intolerance will decrease ?Outcome: Not Progressing ?  ?Problem: Nutrition: ?Goal: Adequate nutrition will be maintained ?Outcome: Not Progressing ?  ?Problem: Elimination: ?Goal: Will not experience complications related to bowel motility ?Outcome: Progressing ?  ?

## 2021-10-13 NOTE — Progress Notes (Signed)
Sherry Coffer, MD notified of family wishes: ?"pt. family at bedside have stated they wanted to honor the patient's wishes and just keep her comfortable. They stated they do not want any further invasive intervention.Sherry [name] and [name] her daughter and son." ?

## 2021-10-13 NOTE — Plan of Care (Signed)
?  Problem: Education: ?Goal: Knowledge of General Education information will improve ?Description: Including pain rating scale, medication(s)/side effects and non-pharmacologic comfort measures ?10/13/2021 0741 by Tanja Port, RN ?Outcome: Not Progressing ?10/13/2021 0428 by Tanja Port, RN ?Outcome: Not Progressing ?  ?Problem: Health Behavior/Discharge Planning: ?Goal: Ability to manage health-related needs will improve ?10/13/2021 0741 by Tanja Port, RN ?Outcome: Not Progressing ?10/13/2021 0428 by Tanja Port, RN ?Outcome: Not Progressing ?  ?Problem: Clinical Measurements: ?Goal: Ability to maintain clinical measurements within normal limits will improve ?Outcome: Progressing ?Goal: Will remain free from infection ?Outcome: Progressing ?Goal: Diagnostic test results will improve ?Outcome: Progressing ?Goal: Respiratory complications will improve ?Outcome: Progressing ?Goal: Cardiovascular complication will be avoided ?Outcome: Progressing ?  ?

## 2021-10-13 NOTE — Procedures (Addendum)
Patient Name: Sherry [name]  ?MRN: [id number]  ?Epilepsy Attending: Lora Havens  ?Referring Physician/Provider: Gwinda Maine, MD ?Duration: 10/12/2021 1508 to 10/13/2021  1508 ?  ?Patient history: 88 y.o. female PMHx as reviewed below previous history of stroke at baseline is AAOx4 family noticed seizure activity which began at 1030 this morning. EEG to evaluate for seizure ?  ?Level of alertness: lethargic ?  ?AEDs during EEG study: LEV, PHT, LCM, GBP ?  ?Technical aspects: This EEG study was done with scalp electrodes positioned according to the 10-20 International system of electrode placement. Electrical activity was acquired at a sampling rate of 500Hz  and reviewed with a high frequency filter of 70Hz  and a low frequency filter of 1Hz . EEG data were recorded continuously and digitally stored.  ?  ?Description: EEG showed continuous generalized and lateralized left hemisphere 3-6hz  theta-delta slowing.  EEG showed near continuous 3 to 6 Hz theta with delta slowing admixed with spikes at 1 to 2 Hz in left hemisphere with brief 3 to 5 seconds of EEG attenuation.  Either no clinical signs were noted or at times patient was noted to have right facial twitching as well as right upper extremity twitching during the EEG.  This pattern is consistent with focal status epilepticus.  Hyperventilation and photic stimulation were not performed.    ?  ?ABNORMALITY ?- Status epilepticus, left hemisphere ?- Continuous slow, generalized and lateralized left hemisphere ?  ?IMPRESSION: ?This study showed evidence of status epilepticus arising from left hemisphere. Additionally there is moderate diffuse encephalopathy, non specific etiology. ? ?EEG appears to be worsening compared to previous day.  ?   ?Lora Havens  ?

## 2021-10-13 NOTE — Progress Notes (Signed)
Ref: Dixie Dials, MD ? ? ?Subjective:  ?Awakens momentarily to tapping shoulder. ?VS stable. ?Seizures continue. ?No Peripheral IV post multiple attempts. ?Family against central line and feeding tube placement. ? ?Objective:  ?Vital Signs in the last 24 hours: ?Temp:  [97.6 ?F (36.4 ?C)-98.4 ?F (36.9 ?C)] 97.6 ?F (36.4 ?C) (03/27 0737) ?Pulse Rate:  [46-84] 84 (03/27 1100) ?Cardiac Rhythm: Atrial fibrillation (03/27 0845) ?Resp:  [12-16] 16 (03/27 1100) ?BP: (124-149)/(65-84) 149/78 (03/27 0737) ?SpO2:  [98 %-100 %] 100 % (03/27 1100) ? ?Physical Exam: ?BP Readings from Last 1 Encounters:  ?10/13/21 (!) 149/78  ?   ?Wt Readings from Last 1 Encounters:  ?10/09/21 52.6 kg  ?  Weight change:  Body mass index is 21.91 kg/m?. ?HEENT: [place]/AT, Eyes-Brown, Conjunctiva-Pink, Sclera-Non-icteric ?Neck: No JVD, No bruit, Trachea midline. ?Lungs:  Clear, Bilateral. ?Cardiac:  Regular rhythm, normal S1 and S2, no S3. II/VI systolic murmur. ?Abdomen:  Soft, non-tender. BS present. ?Extremities:  No edema present. No cyanosis. No clubbing. ?CNS: AxOx0, Cranial nerves grossly intact.  ?Skin: Warm and dry. ? ? ?Intake/Output from previous day: ?03/26 0701 - 03/27 0700 ?In: 761.6 [I.V.:636.6; IV Piggyback:125] ?Out: 350 [Urine:350] ? ? ? ?Lab Results: ?BMET ?   ?Component Value Date/Time  ? NA 139 10/08/2021 0328  ? NA 141 10/07/2021 1354  ? NA 143 10/07/2021 1205  ? K 4.8 10/08/2021 0328  ? K 3.8 10/07/2021 1354  ? K 3.9 10/07/2021 1205  ? CL 106 10/08/2021 0328  ? CL 108 10/07/2021 1205  ? CL 100 06/10/2021 0516  ? CO2 16 (L) 10/08/2021 0328  ? CO2 21 (L) 10/07/2021 1205  ? CO2 25 06/10/2021 0516  ? GLUCOSE 73 10/08/2021 0328  ? GLUCOSE 97 10/07/2021 1205  ? GLUCOSE 94 06/10/2021 0516  ? BUN 26 (H) 10/08/2021 0328  ? BUN 26 (H) 10/07/2021 1205  ? BUN 10 06/10/2021 0516  ? CREATININE 1.13 (H) 10/08/2021 0328  ? CREATININE 1.37 (H) 10/07/2021 1205  ? CREATININE 1.10 (H) 06/10/2021 0516  ? CALCIUM 9.3 10/08/2021 0328  ? CALCIUM 9.0  10/07/2021 1205  ? CALCIUM 9.1 06/10/2021 0516  ? GFRNONAA 43 (L) 10/08/2021 0328  ? GFRNONAA 34 (L) 10/07/2021 1205  ? GFRNONAA 45 (L) 06/10/2021 0516  ? GFRAA 43 (L) 08/07/2017 1519  ? GFRAA (L) 01/18/2010 2350  ?  46        ?The eGFR has been calculated ?using the MDRD equation. ?This calculation has not been ?validated in all clinical ?situations. ?eGFR's persistently ?<60 mL/min signify ?possible Chronic Kidney Disease.  ? GFRAA (L) 05/31/2007 1100  ?  55        ?The eGFR has been calculated ?using the MDRD equation. ?This calculation has not been ?validated in all clinical  ? ?CBC ?   ?Component Value Date/Time  ? WBC 5.7 10/08/2021 0413  ? RBC 4.46 10/08/2021 0413  ? HGB 13.2 10/08/2021 0413  ? HCT 40.6 10/08/2021 0413  ? PLT 210 10/08/2021 0413  ? MCV 91.0 10/08/2021 0413  ? MCH 29.6 10/08/2021 0413  ? MCHC 32.5 10/08/2021 0413  ? RDW 12.5 10/08/2021 0413  ? LYMPHSABS 1.3 10/07/2021 1205  ? MONOABS 0.4 10/07/2021 1205  ? EOSABS 0.2 10/07/2021 1205  ? BASOSABS 0.0 10/07/2021 1205  ? ?HEPATIC Function Panel ?Recent Labs  ?  05/29/21 ?4967 06/10/21 ?5916 10/07/21 ?1205  ?PROT 6.2* 6.4* 5.9*  ? ?HEMOGLOBIN A1C ?No components found for: HGA1C,  MPG ?CARDIAC ENZYMES ?Lab Results  ?Component Value  Date  ? CKTOTAL 23 (L) 10/07/2021  ? CKMB 1.8 05/29/2007  ? TROPONINI 0.02        ?NO INDICATION OF ?MYOCARDIAL INJURY. 05/28/2007  ? TROPONINI 0.03        ?NO INDICATION OF ?MYOCARDIAL INJURY. 05/27/2007  ? ?BNP ?No results for input(s): PROBNP in the last 8760 hours. ?TSH ?No results for input(s): TSH in the last 8760 hours. ?CHOLESTEROL ?Recent Labs  ?  05/24/21 ?0715  ?CHOL 208*  ? ? ?Scheduled Meds: ? heparin injection (subcutaneous)  5,000 Units Subcutaneous Q8H  ? LORazepam  1 mg Intramuscular Q6H  ? mouth rinse  15 mL Mouth Rinse BID  ? perampanel  8 mg Oral Once  ? phenytoin (DILANTIN) IV  75 mg Intravenous Q8H  ? ?Continuous Infusions: ? sodium chloride Stopped (10/13/21 0748)  ? lacosamide (VIMPAT) IV Stopped  (10/13/21 0701)  ? levETIRAcetam Stopped (10/13/21 0701)  ? ?PRN Meds:.acetaminophen ? ?Assessment/Plan: ? Left hemisphere seizures, new onset ?Severe AS ?HTN ?Atrial fibrillation ?HLD ?GERD ?Arthritis ?Metabolic acidosis ?CKD, II ? ?Plan: ?Lorazepam 1 mg. IM q 6 hr for comfort care and seizure control. ?Family to discuss and left me know if they agree for central line v/s NG tube placement v/s continue comfort care. ? ? LOS: 6 days  ? ?Time spent including chart review, lab review, examination, discussion with patient/Nurse/Family : 30 min ? ? ?Dixie Dials  MD  ?10/13/2021, 12:00 PM ? ? ? ? ?

## 2021-10-13 NOTE — Progress Notes (Signed)
LTM EEG discontinued - no skin breakdown at unhook.   

## 2021-10-13 NOTE — Progress Notes (Signed)
Algie Coffer, MD aware of blood sugar below 70.  No intervention advised ?

## 2021-10-14 DIAGNOSIS — Z515 Encounter for palliative care: Secondary | ICD-10-CM

## 2021-10-14 DIAGNOSIS — Z7189 Other specified counseling: Secondary | ICD-10-CM

## 2021-10-14 DIAGNOSIS — Z66 Do not resuscitate: Secondary | ICD-10-CM

## 2021-10-14 LAB — VITAMIN E
Vitamin E (Alpha Tocopherol): 15.1 mg/L (ref 9.0–29.0)
Vitamin E(Gamma Tocopherol): 0.8 mg/L (ref 0.5–4.9)

## 2021-10-14 LAB — GLUCOSE, CAPILLARY: Glucose-Capillary: 67 mg/dL — ABNORMAL LOW (ref 70–99)

## 2021-10-14 NOTE — Consult Note (Addendum)
? ?                                                                                ?Consultation Note ?Date: 10/14/2021  ? ?Patient Name: Sherry [name]  ?DOB: [birth year]  MRN: [id number]  Age / Sex: 88 y.o., female  ?PCP: Dixie Dials, MD ?Referring Physician: Dixie Dials, MD ? ?Reason for Consultation: Establishing goals of care ? ?HPI/Patient Profile: 88 y.o. female  with past medical history of atrial fibrillation, recent stroke, osteoarthritis, GERD, hypertension, hyperlipidemia who presented to the emergency department on 10/07/2021 with new onset seizures. Admitted to Dr. Doylene Canard.  ? 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 ? ?Clinical Assessment and Goals of Care: ?I have reviewed medical records including EPIC notes, labs and imaging, and went to see patient at bedside. She is unresponsive to voice and light touch.  ? ?I met with several of her children in the 3W conference room to discuss diagnosis, prognosis, GOC, EOL wishes, disposition, and options. Present are her son Sherry [name], and 2 daughters Sherry [name] and Sherry [name]). ? ?I introduced Palliative Medicine as specialized medical care for people living with serious illness. It focuses on providing relief from the symptoms and stress of a serious illness.  ? ?We discussed a brief life review of the patient. Sherry [name] was born in [place]. Family describes her as a "remarkable" woman who was compassionate and hard-working. She is also a faithful woman of the [place]. She was married for over 4 years. Her husband passed away about 7 years ago. They had 10 children together; 8 are living and 2 are deceased. 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  ? ?Since her husband passed away, Sherry [name] has continued to live in her own home in [place]. Her children have rotated so that someone is with  her 24/7. Before her stoke in November 2022, she could ambulate with a walker but needed help with ADLs. After the stroke, her functional status was significantly impaired initially, but then improved with therapy.  ? ?We discussed her current illness and what it means in the larger context of her ongoing co-morbidities. Discussed the natural disease trajectory of a sudden, severe neurological injury such as stroke. Discussed that most patients do not return to their previous baseline.  ?Discussed that per neurology, patient is mostly likely having seizures due to her chronic infarct. Discussed that she has been in status epilepticus for multiple days without much improvement. They verbalize understanding her prognosis is poor. Provided education on natural trajectory at EOL.  ? ?The difference between full scope medical intervention and comfort care was considered.  I introduced the concept of a comfort path to family, emphasizing that this path involves de-escalating and stopping full scope medical interventions, allowing a natural course to occur. Discussed that the goal is comfort and dignity rather than cure/prolonging life. Introduced hospice philosophy and provided information on home versus residential hospice services - answered all questions. Family would prefer to bring patient home with hospice.  ? ?We discussed 2 possible scenarios:  ?1) Seizures may  improve and patient's mental status may improve (less likely). In this case would recommend SLP evaluation to determine if patient could tolerate comfort feeds and oral medications. ?2) Seizures will not improve and mental status will not improve (more likely). In this case, we could administer medications by alternate routes to control seizure activity. Consider transferring to hospice facility (over home with hospice) for symptom management. ? ?At this point, family wishes to continue current interventions with watchful waiting. They do not wish to  escalate care or pursue any invasive interventions, including NG tube. They want to keep her comfortable. They are hopeful that her mental status may improve enough over the next several days that she could tolerate comfort feeds and oral anti-seizure medications at home. They ultimately want to take her home with hospice if possible.  ? ?Questions and concerns were addressed.  The family was encouraged to call with questions or concerns.  ? ? ?Primary decision maker: 26 of adult children. They all communicate together, and it appears Sherry [name],Sherry [name], and Sherry [name] are the spokespersons for the family.  ?  ? ?SUMMARY OF RECOMMENDATIONS   ?DNR/DNI as previously documented ?Continue current interventions with watchful waiting for the next 48-72 hours ?No escalation of care, no NG tube ?Family ultimately wants to bring patient home with hospice ?Family is hopeful patient will wake up enough to tolerate oral medications to control seizures at home - if not will need to use meds (benzos) by alternate routes ?PMT will continue to follow ? ? ?Additional Recommendations (Limitations, Scope, Preferences): ?No Artificial Feeding ? ?Prognosis:  ?Poor overall ? ?Discharge Planning: To Be Determined  ? ?  ? ?Primary Diagnoses: ?Present on Admission: ?**None** ? ? ?I have reviewed the medical record, interviewed the patient and family, and examined the patient. The following aspects are pertinent. ? ?Past Medical History:  ?Diagnosis Date  ? Acid reflux   ? Arthritis   ? Atrial fibrillation ([place])   ? Chronic constipation   ? GERD (gastroesophageal reflux disease)   ? Hyperlipidemia   ? Hypertension   ? Osteoarthritis   ? Pressure ulcer of sacral region   ? ? ? ?Family History  ?Problem Relation Age of Onset  ? Hypertension Mother   ? Other Brother   ?     malignant neoplastic disease  ? Colon cancer Neg Hx   ? Stomach cancer Neg Hx   ? Esophageal cancer Neg Hx   ? Pancreatic cancer Neg Hx   ? ?Scheduled Meds: ? heparin injection  (subcutaneous)  5,000 Units Subcutaneous Q8H  ? LORazepam  1 mg Intramuscular Q6H  ? mouth rinse  15 mL Mouth Rinse BID  ? phenytoin (DILANTIN) IV  75 mg Intravenous Q8H  ? ?Continuous Infusions: ? lacosamide (VIMPAT) IV Stopped (10/13/21 0701)  ? levETIRAcetam Stopped (10/13/21 0701)  ? ?PRN Meds:.acetaminophen ? ? ?No Known Allergies ?Review of Systems  ?Unable to perform ROS: Patient unresponsive  ? ?Physical Exam ?Vitals reviewed.  ?Constitutional:   ?   General: She is not in acute distress. ?   Appearance: She is ill-appearing.  ?   Comments: Frail ?Temporal wasting  ?Pulmonary:  ?   Effort: Pulmonary effort is normal.  ?Neurological:  ?   Mental Status: She is unresponsive.  ? ? ?Vital Signs: BP (!) 135/57 (BP Location: Right Arm)   Pulse 63   Temp 97.8 ?F (36.6 ?C) (Oral)   Resp 11   Ht _0  (1.549 m)   Wt 52.6 kg  SpO2 100%   BMI 21.91 kg/m?  ?Pain Scale: PAINAD ?  ?Pain Score: Asleep ? ? ?SpO2: SpO2: 100 % ?O2 Device:SpO2: 100 % ?O2 Flow Rate: .O2 Flow Rate (L/min): 2 L/min ? ? ? ?LBM: Last BM Date :  (PTA) ?Baseline Weight: Weight: 47.4 kg ?Most recent weight: Weight: 52.6 kg     ? ?Palliative Assessment/Data: PPS 10% ? ? ? ?MDM - High ? ?Signed by: ?Lavena Bullion, NP ?  ?Please contact Palliative Medicine Team phone at [phone] for questions and concerns.  ?For individual provider: See Amion ? ? ? ? ? ? ? ? ? ? ? ? ? ?

## 2021-10-14 NOTE — Procedures (Signed)
Patient Name: Sherry [name]  ?MRN: [id number]  ?Epilepsy Attending: Lora Havens  ?Referring Physician/Provider: Gwinda Maine, MD ?Duration: 10/13/2021 1508 to 10/13/2021  1851 ?  ?Patient history: 88 y.o. female PMHx as reviewed below previous history of stroke at baseline is AAOx4 family noticed seizure activity which began at 1030 this morning. EEG to evaluate for seizure ?  ?Level of alertness: lethargic ?  ?AEDs during EEG study: LEV, PHT, LCM, GBP ?  ?Technical aspects: This EEG study was done with scalp electrodes positioned according to the 10-20 International system of electrode placement. Electrical activity was acquired at a sampling rate of 500Hz  and reviewed with a high frequency filter of 70Hz  and a low frequency filter of 1Hz . EEG data were recorded continuously and digitally stored.  ?  ?Description: EEG showed continuous generalized and lateralized left hemisphere 3-6hz  theta-delta slowing.  EEG showed near continuous 3 to 6 Hz theta with delta slowing admixed with spikes at 1 to 2 Hz in left hemisphere with brief 3 to 5 seconds of EEG attenuation.  Either no clinical signs were noted or at times patient was noted to have right facial twitching as well as right upper extremity twitching during the EEG.  This pattern is consistent with focal status epilepticus.  Hyperventilation and photic stimulation were not performed.    ?  ?ABNORMALITY ?- Status epilepticus, left hemisphere ?- Continuous slow, generalized and lateralized left hemisphere ?  ?IMPRESSION: ?This study showed evidence of status epilepticus arising from left hemisphere. Additionally there is moderate diffuse encephalopathy, non specific etiology. ?  ?Lora Havens  ?

## 2021-10-14 NOTE — Plan of Care (Signed)
  Problem: Education: Goal: Knowledge of General Education information will improve Description Including pain rating scale, medication(s)/side effects and non-pharmacologic comfort measures Outcome: Progressing   Problem: Health Behavior/Discharge Planning: Goal: Ability to manage health-related needs will improve Outcome: Progressing   

## 2021-10-15 ENCOUNTER — Ambulatory Visit: Payer: 59 | Admitting: Podiatry

## 2021-10-15 DIAGNOSIS — Z789 Other specified health status: Secondary | ICD-10-CM

## 2021-10-15 LAB — GLUCOSE, CAPILLARY
Glucose-Capillary: 88 mg/dL (ref 70–99)
Glucose-Capillary: 81 mg/dL (ref 70–99)
Glucose-Capillary: 81 mg/dL (ref 70–99)
Glucose-Capillary: 89 mg/dL (ref 70–99)

## 2021-10-15 MED ORDER — LORAZEPAM 2 MG/ML PO CONC: 1.0000 mg | ORAL | Status: DC | PRN

## 2021-10-15 MED ORDER — LORAZEPAM 2 MG/ML PO CONC
1.0000 mg | Freq: Four times a day (QID) | ORAL | Status: DC
  Administered 2021-10-15 – 2021-10-17 (×8): 1 mg via SUBLINGUAL
  Filled 2021-10-15 (×8): qty 1

## 2021-10-15 NOTE — Progress Notes (Signed)
Ref: Dixie Dials, MD ? ? ?Subjective:  ?Unresponsive. ? ?Objective:  ?Vital Signs in the last 24 hours: ?Temp:  [97.6 ?F (36.4 ?C)-98.2 ?F (36.8 ?C)] 98.2 ?F (36.8 ?C) (03/29 1935) ?Pulse Rate:  [51-104] 51 (03/29 1935) ?Cardiac Rhythm: Atrial fibrillation (03/29 1900) ?Resp:  [20-22] 20 (03/29 1935) ?BP: (115-146)/(72-84) 146/76 (03/29 1935) ?SpO2:  [99 %-100 %] 99 % (03/29 1935) ? ?Physical Exam: ?BP Readings from Last 1 Encounters:  ?10/15/21 (!) 146/76  ?   ?Wt Readings from Last 1 Encounters:  ?10/09/21 52.6 kg  ?  Weight change:  Body mass index is 21.91 kg/m?. ?HEENT: [place]/AT, Eyes-Brown,  Conjunctiva-Pink, Sclera-Non-icteric ?Neck: No JVD, No bruit, Trachea midline. ?Lungs:  Clear, Bilateral. ?Cardiac:  Regular rhythm, normal S1 and S2, no S3. II/VI systolic murmur. ?Abdomen:  Soft, non-tender. BS present. ?Extremities:  No edema present. No cyanosis. No clubbing. ?CNS: AxOx0  ?Skin: Warm and dry. ? ? ?Intake/Output from previous day: ?03/28 0701 - 03/29 0700 ?In: -  ?Out: 550 [Urine:550] ? ? ? ?Lab Results: ?BMET ?   ?Component Value Date/Time  ? NA 139 10/08/2021 0328  ? NA 141 10/07/2021 1354  ? NA 143 10/07/2021 1205  ? K 4.8 10/08/2021 0328  ? K 3.8 10/07/2021 1354  ? K 3.9 10/07/2021 1205  ? CL 106 10/08/2021 0328  ? CL 108 10/07/2021 1205  ? CL 100 06/10/2021 0516  ? CO2 16 (L) 10/08/2021 0328  ? CO2 21 (L) 10/07/2021 1205  ? CO2 25 06/10/2021 0516  ? GLUCOSE 73 10/08/2021 0328  ? GLUCOSE 97 10/07/2021 1205  ? GLUCOSE 94 06/10/2021 0516  ? BUN 26 (H) 10/08/2021 0328  ? BUN 26 (H) 10/07/2021 1205  ? BUN 10 06/10/2021 0516  ? CREATININE 1.13 (H) 10/08/2021 0328  ? CREATININE 1.37 (H) 10/07/2021 1205  ? CREATININE 1.10 (H) 06/10/2021 0516  ? CALCIUM 9.3 10/08/2021 0328  ? CALCIUM 9.0 10/07/2021 1205  ? CALCIUM 9.1 06/10/2021 0516  ? GFRNONAA 43 (L) 10/08/2021 0328  ? GFRNONAA 34 (L) 10/07/2021 1205  ? GFRNONAA 45 (L) 06/10/2021 0516  ? GFRAA 43 (L) 08/07/2017 1519  ? GFRAA (L) 01/18/2010 2350  ?  46         ?The eGFR has been calculated ?using the MDRD equation. ?This calculation has not been ?validated in all clinical ?situations. ?eGFR's persistently ?<60 mL/min signify ?possible Chronic Kidney Disease.  ? GFRAA (L) 05/31/2007 1100  ?  55        ?The eGFR has been calculated ?using the MDRD equation. ?This calculation has not been ?validated in all clinical  ? ?CBC ?   ?Component Value Date/Time  ? WBC 5.7 10/08/2021 0413  ? RBC 4.46 10/08/2021 0413  ? HGB 13.2 10/08/2021 0413  ? HCT 40.6 10/08/2021 0413  ? PLT 210 10/08/2021 0413  ? MCV 91.0 10/08/2021 0413  ? MCH 29.6 10/08/2021 0413  ? MCHC 32.5 10/08/2021 0413  ? RDW 12.5 10/08/2021 0413  ? LYMPHSABS 1.3 10/07/2021 1205  ? MONOABS 0.4 10/07/2021 1205  ? EOSABS 0.2 10/07/2021 1205  ? BASOSABS 0.0 10/07/2021 1205  ? ?HEPATIC Function Panel ?Recent Labs  ?  05/29/21 ?0175 06/10/21 ?1025 10/07/21 ?1205  ?PROT 6.2* 6.4* 5.9*  ? ?HEMOGLOBIN A1C ?No components found for: HGA1C,  MPG ?CARDIAC ENZYMES ?Lab Results  ?Component Value Date  ? CKTOTAL 23 (L) 10/07/2021  ? CKMB 1.8 05/29/2007  ? TROPONINI 0.02        ?NO INDICATION OF ?MYOCARDIAL  INJURY. 05/28/2007  ? TROPONINI 0.03        ?NO INDICATION OF ?MYOCARDIAL INJURY. 05/27/2007  ? ?BNP ?No results for input(s): PROBNP in the last 8760 hours. ?TSH ?No results for input(s): TSH in the last 8760 hours. ?CHOLESTEROL ?Recent Labs  ?  05/24/21 ?0715  ?CHOL 208*  ? ? ?Scheduled Meds: ? heparin injection (subcutaneous)  5,000 Units Subcutaneous Q8H  ? LORazepam  1 mg Sublingual Q6H  ? mouth rinse  15 mL Mouth Rinse BID  ? phenytoin (DILANTIN) IV  75 mg Intravenous Q8H  ? ?Continuous Infusions: ? lacosamide (VIMPAT) IV Stopped (10/13/21 0701)  ? levETIRAcetam Stopped (10/13/21 0701)  ? ?PRN Meds:.acetaminophen, LORazepam ? ?Assessment/Plan: ? Seizure disorder, new onset ?Severe AS ?Atrial fibrillation ?HTN ?HLD ?GERD ?CKD II ?Metabolic acidosis ?CKD, II ? ?Plan: ?Continue comfort care. ?Home with hospice if  arranged/family. ? ? ? LOS: 8 days  ? ?Time spent including chart review, lab review, examination, discussion with patient's family/Nurse : 30 min ? ? ?Dixie Dials  MD  ?10/15/2021, 10:31 PM ? ? ? ? ?

## 2021-10-15 NOTE — Progress Notes (Signed)
? ?                                                                                                                                                     ?                                                   ?Daily Progress Note  ? ?Patient Name: Sherry [name]       Date: 10/15/2021 ?DOB: 1919/12/22  Age: 88 y.o. MRN#: [id number] ?Attending Physician: Dixie Dials, MD ?Primary Care Physician: Dixie Dials, MD ?Admit Date: 10/07/2021 ? ?Reason for Consultation/Follow-up: Establishing goals of care ? ?Subjective: ?Chart review performed.  Received report from primary RN -no acute concerns.  RN states patient remains lethargic and unarousable.  Reports patient has not had anything to eat or drink over the past 24 to 48 hours.  RN also reports patient does not have IV access despite multiple attempts by floor RNs and IV team. ? ?Received updates from PMT RN. ? ?Went to visit patient at bedside -no family/visitors present.  Patient was lying in bed -she does not wake to voice/gentle touch.  No signs or non-verbal gestures of pain or discomfort noted. No respiratory distress, increased work of breathing, or secretions noted.  Weaned from 2L to room air -monitor for several minutes patient's saturations remained at 98 to 100%.  ? ?Called daughter/Sherry [name].  Emotional support provided.  Reviewed previous conversations with Dr. Doylene Canard and Gregary Signs NP/PMT.  Clarified goals -family is agreeable with comfort measures in house however they do remain hopeful that patient will become more wakeful.  Family plans to meet with Dr. Doylene Canard on Friday 3/31 for final disposition decision.  She is clear that family has discussed they would like to bring patient home with hospice.  Discussed initiating hospice referral to work on getting DME delivered -goal would be to have DME in place so after family meeting on Friday patient does not have to delay discharge.  Sherry [name] is agreeable for hospice referral to be sent today so they can work on getting  DME -request [facility]. ? ?Education provided that poor oral intake is a poor prognostic indicator.  Recommendation given for patient to discharge sooner than later as it is anticipated she will continue to decline and may likely be unstable for transfer soon.  Sherry [name] is clear that family do not want discharge prior to Friday as scheduled meeting has already been set up with Dr. Doylene Canard. ? ?All questions and concerns addressed. Encouraged to call with questions and/or concerns. PMT card previously provided. ? ?Returned to patient's bedside - she remains at 99% on RA. ? ? ?Length of  Stay: 8 ? ?Current Medications: ?Scheduled Meds:  ? heparin injection (subcutaneous)  5,000 Units Subcutaneous Q8H  ? LORazepam  1 mg Intramuscular Q6H  ? mouth rinse  15 mL Mouth Rinse BID  ? phenytoin (DILANTIN) IV  75 mg Intravenous Q8H  ? ? ?Continuous Infusions: ? lacosamide (VIMPAT) IV Stopped (10/13/21 0701)  ? levETIRAcetam Stopped (10/13/21 0701)  ? ? ?PRN Meds: ?acetaminophen ? ?Physical Exam ?Vitals and nursing note reviewed.  ?Constitutional:   ?   General: She is not in acute distress. ?   Appearance: She is ill-appearing.  ?Pulmonary:  ?   Effort: No respiratory distress.  ?Skin: ?   General: Skin is warm and dry.  ?Neurological:  ?   Mental Status: She is unresponsive.  ?   Motor: Weakness present.  ?Psychiatric:     ?   Speech: She is noncommunicative.  ?         ? ?Vital Signs: BP 124/72 (BP Location: Right Arm)   Pulse 89   Temp 97.7 ?F (36.5 ?C) (Oral)   Resp (!) 22   Ht 5\' 1"  (1.549 m)   Wt 52.6 kg   SpO2 100%   BMI 21.91 kg/m?  ?SpO2: SpO2: 100 % ?O2 Device: O2 Device: Nasal Cannula ?O2 Flow Rate: O2 Flow Rate (L/min): 2 L/min ? ?Intake/output summary:  ?Intake/Output Summary (Last 24 hours) at 10/15/2021 1100 ?Last data filed at 10/15/2021 0350 ?Gross per 24 hour  ?Intake --  ?Output 550 ml  ?Net -550 ml  ? ?LBM: Last BM Date :  (PTA) ?Baseline Weight: Weight: 47.4 kg ?Most recent weight: Weight:  52.6 kg ? ?     ?Palliative Assessment/Data: PPS 10% ? ? ? ? ? ?Patient Active Problem List  ? Diagnosis Date Noted  ? Seizures ([place]) 10/07/2021  ? Sore throat   ? Hyponatremia   ? Essential hypertension   ? Generalized arthritis   ? Acute ischemic left middle cerebral artery (MCA) stroke ([place]) 05/28/2021  ? Pressure injury of skin 05/26/2021  ? Stroke (cerebrum) ([place]) 05/24/2021  ? Callus 04/07/2021  ? Pelvic pain 11/25/2020  ? Slow transit constipation 11/25/2020  ? Atrial fibrillation ([place]) 10/17/2020  ? ? ?Palliative Care Assessment & Plan  ? ?Patient Profile: ?88 y.o. female  with past medical history of atrial fibrillation, recent stroke, osteoarthritis, GERD, hypertension, hyperlipidemia who presented to the emergency department on 10/07/2021 with new onset seizures. Admitted to Dr. Doylene Canard.  ?  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 ? ?Assessment: ?Principal Problem: ?  Seizures ([place]) ?Terminal care ? ?Recommendations/Plan: ?Continue comfort care with watchful waiting  ?Continue DNR/DNI as previously documented - durable DNR form completed and placed in shadow chart. Copy was made and will be scanned into Vynca/ACP tab ?Family have a scheduled follow up meeting with Dr. Doylene Canard on Friday 3/31 - plan is for patient to remain in house until then ?Family are sure, however, they would want patient discharged home with hospice - requesting [facility] [place].  They are agreeable for referral to be sent today to begin the process of having DME delivered to the home in anticipation of patient's discharge Friday ?Recommendation is for patient to discharge home with hospice sooner than later as it is anticipated patient will continue to decline and may soon be unstable for transfer - if goal is to get patient home, would want to  support that goal while patient is stable ?Adjusted  Ativan 1 mg IM every 6 hours to sublingual for comfort, added PRN doses for breakthrough symptoms/seizures ?Goal is to keep patient stable for hopeful discharge Friday with home hospice ?PMT will continue to follow and support holistically ? ?Goals of Care and Additional Recommendations: ?Limitations on Scope of Treatment: Full Comfort Care ? ?Code Status: ? ?  ?Code Status Orders  ?(From admission, onward)  ?  ? ? ?  ? ?  Start     Ordered  ? 10/13/21 1201  Do not attempt resuscitation (DNR)  Continuous       ?Question Answer Comment  ?In the event of cardiac or respiratory ARREST Do not call a ?code blue?   ?In the event of cardiac or respiratory ARREST Do not perform Intubation, CPR, defibrillation or ACLS   ?In the event of cardiac or respiratory ARREST Use medication by any route, position, wound care, and other measures to relive pain and suffering. May use oxygen, suction and manual treatment of airway obstruction as needed for comfort.   ?  ? 10/13/21 1200  ? ?  ?  ? ?  ? ?Code Status History   ? ? Date Active Date Inactive Code Status Order ID Comments User Context  ? 10/07/2021 1426 10/13/2021 1200 Partial Code [id number]  Dixie Dials, MD ED  ? 05/28/2021 1529 06/11/2021 1710 DNR [id number]  Bary Leriche, PA-C Inpatient  ? 05/28/2021 1529 05/28/2021 1529 DNR [id number]  Bary Leriche, PA-C Inpatient  ? 05/24/2021 0640 05/28/2021 1526 DNR [id number]  Greta Doom, MD ED  ? ?  ? ?Advance Directive Documentation   ? ?Flowsheet Row Most Recent Value  ?Type of Advance Directive Healthcare Power of Attorney  ?Pre-existing out of facility DNR order (yellow form or pink MOST form) --  ?"MOST" Form in Place? --  ? ?  ? ? ?Prognosis: ? < 2 weeks ? ?Discharge Planning: ?Home with Hospice ? ?Care plan was discussed with primary RN, patient's daughter, TOC, Dr. Doylene Canard ? ?Thank you for allowing the Palliative Medicine Team to assist in the care of this patient. ? ? ?Total Time 40 minutes Prolonged Time Billed  no    ? ?   ?Greater than 50%  of this time was spent counseling and coordinating care related to the above assessment and plan. ? ?Lin Landsman, NP ? ?Please contact Palliative Medicine Team phone at [phone] for q

## 2021-10-15 NOTE — TOC Initial Note (Signed)
Transition of Care (TOC) - Initial/Assessment Note  ? ? ?Patient Details  ?Name: Sherry [name] ?MRN: [id number] ?Date of Birth: March 14, 1920 ? ?Transition of Care (TOC) CM/SW Contact:    ?Pollie Friar, RN ?Phone Number: ?10/15/2021, 1:22 PM ? ?Clinical Narrative:                 ?CM received a referral for home with hospice. CM called the patients daughter and confirmed the plans, hospice agency and family contact. CM has made referral to [facility] with [place]. They are going to speak with daughter and arrange DME for home on Friday.  ?TOC following. ? ?Expected Discharge Plan: [place] ?Barriers to Discharge: Continued Medical Work up ? ? ?Patient Goals and CMS Choice ?  ?CMS Medicare.gov Compare Post Acute Care list provided to:: Patient Represenative (must comment) ?Choice offered to / list presented to : Adult Children ? ?Expected Discharge Plan and Services ?Expected Discharge Plan: [place] ?  ?Discharge Planning Services: CM Consult ?  ?Living arrangements for the past 2 months: [place] ?                ?  ?  ?  ?  ?  ?  ?  ?  ?  ?  ? ?Prior Living Arrangements/Services ?Living arrangements for the past 2 months: [place] ?Lives with:: Adult Children ?Patient language and need for interpreter reviewed:: Yes ?Do you feel safe going back to the place where you live?: Yes      ?Need for Family Participation in Patient Care: Yes (Comment) ?Care giver support system in place?: Yes (comment) ?  ?Criminal Activity/Legal Involvement Pertinent to Current Situation/Hospitalization: No - Comment as needed ? ?Activities of Daily Living ?Home Assistive Devices/Equipment: None ?ADL Screening (condition at time of admission) ?Patient's cognitive ability adequate to safely complete daily activities?: No ?Is the patient deaf or have difficulty hearing?: Yes ?Does the patient have difficulty seeing, even when wearing glasses/contacts?: Yes ?Does the patient have difficulty  concentrating, remembering, or making decisions?: Yes ?Patient able to express need for assistance with ADLs?: No ?Does the patient have difficulty dressing or bathing?: Yes ?Independently performs ADLs?: No ?Communication: Dependent ?Is this a change from baseline?: Change from baseline, expected to last >3 days ?Dressing (OT): Dependent ?Is this a change from baseline?: Change from baseline, expected to last >3 days ?Grooming: Dependent ?Is this a change from baseline?: Change from baseline, expected to last >3 days ?Feeding: Dependent ?Is this a change from baseline?: Change from baseline, expected to last >3 days ?Bathing: Dependent ?Is this a change from baseline?: Change from baseline, expected to last >3 days ?Toileting: Dependent ?Is this a change from baseline?: Change from baseline, expected to last >3days ?In/Out Bed: Dependent ?Is this a change from baseline?: Change from baseline, expected to last >3 days ?Walks in Home: Dependent ?Is this a change from baseline?: Change from baseline, expected to last >3 days ?Does the patient have difficulty walking or climbing stairs?: Yes ?Weakness of Legs: Both ?Weakness of Arms/Hands: Both ? ?Permission Sought/Granted ?  ?  ?   ?   ?   ?   ? ?Emotional Assessment ?Appearance:: Appears stated age ?  ?  ?  ?  ?Psych Involvement: No (comment) ? ?Admission diagnosis:  Seizures ([place]) [R56.9] ?New onset seizure ([place]) [R56.9] ?Altered mental status, unspecified altered mental status type [R41.82] ?Patient Active Problem List  ? Diagnosis Date Noted  ?  Seizures ([place]) 10/07/2021  ? Sore throat   ? Hyponatremia   ? Essential hypertension   ? Generalized arthritis   ? Acute ischemic left middle cerebral artery (MCA) stroke ([place]) 05/28/2021  ? Pressure injury of skin 05/26/2021  ? Stroke (cerebrum) ([place]) 05/24/2021  ? Callus 04/07/2021  ? Pelvic pain 11/25/2020  ? Slow transit constipation 11/25/2020  ? Atrial fibrillation ([place]) 10/17/2020  ? ?PCP:  Dixie Dials,  MD ?Pharmacy:   ?[facility] ?[place] ?[place] [address] ?Phone: [phone] Fax: [phone] ? ?[facility] [phone] - [place], [address] ?[address] ?[place] [address] ?Phone: [phone] Fax: [phone] ? ? ? ? ?Social Determinants of Health (SDOH) Interventions ?  ? ?Readmission Risk Interventions ?   ? View : No data to display.  ?  ?  ?  ? ? ? ?

## 2021-10-15 NOTE — Progress Notes (Signed)
Late entry ?Ref: Dixie Dials, MD ? ? ?Subjective:  ?Unresponsive. ?Family agrees to comfort care. ? ?Objective:  ?Vital Signs in the last 24 hours: ?Temp:  [97.6 ?F (36.4 ?C)-98.2 ?F (36.8 ?C)] 98.2 ?F (36.8 ?C) (03/29 1935) ?Pulse Rate:  [51-104] 51 (03/29 1935) ?Cardiac Rhythm: Atrial fibrillation (03/29 1900) ?Resp:  [20-22] 20 (03/29 1935) ?BP: (115-146)/(72-84) 146/76 (03/29 1935) ?SpO2:  [99 %-100 %] 99 % (03/29 1935) ? ?Physical Exam: ?BP Readings from Last 1 Encounters:  ?10/15/21 (!) 146/76  ?   ?Wt Readings from Last 1 Encounters:  ?10/09/21 52.6 kg  ?  Weight change:  Body mass index is 21.91 kg/m?. ?HEENT: [place]/AT, Eyes-Brown, Conjunctiva-Pink, Sclera-Non-icteric ?Neck: No JVD, No bruit, Trachea midline. ?Lungs:  Clear, Bilateral. ?Cardiac:  Regular rhythm, normal S1 and S2, no S3. II/VI systolic murmur. ?Abdomen:  Soft, non-tender. BS present. ?Extremities:  No edema present. No cyanosis. No clubbing. ?CNS: AxOx0.  ?Skin: Warm and dry. ? ? ?Intake/Output from previous day: ?03/28 0701 - 03/29 0700 ?In: -  ?Out: 550 [Urine:550] ? ? ? ?Lab Results: ?BMET ?   ?Component Value Date/Time  ? NA 139 10/08/2021 0328  ? NA 141 10/07/2021 1354  ? NA 143 10/07/2021 1205  ? K 4.8 10/08/2021 0328  ? K 3.8 10/07/2021 1354  ? K 3.9 10/07/2021 1205  ? CL 106 10/08/2021 0328  ? CL 108 10/07/2021 1205  ? CL 100 06/10/2021 0516  ? CO2 16 (L) 10/08/2021 0328  ? CO2 21 (L) 10/07/2021 1205  ? CO2 25 06/10/2021 0516  ? GLUCOSE 73 10/08/2021 0328  ? GLUCOSE 97 10/07/2021 1205  ? GLUCOSE 94 06/10/2021 0516  ? BUN 26 (H) 10/08/2021 0328  ? BUN 26 (H) 10/07/2021 1205  ? BUN 10 06/10/2021 0516  ? CREATININE 1.13 (H) 10/08/2021 0328  ? CREATININE 1.37 (H) 10/07/2021 1205  ? CREATININE 1.10 (H) 06/10/2021 0516  ? CALCIUM 9.3 10/08/2021 0328  ? CALCIUM 9.0 10/07/2021 1205  ? CALCIUM 9.1 06/10/2021 0516  ? GFRNONAA 43 (L) 10/08/2021 0328  ? GFRNONAA 34 (L) 10/07/2021 1205  ? GFRNONAA 45 (L) 06/10/2021 0516  ? GFRAA 43 (L) 08/07/2017  1519  ? GFRAA (L) 01/18/2010 2350  ?  46        ?The eGFR has been calculated ?using the MDRD equation. ?This calculation has not been ?validated in all clinical ?situations. ?eGFR's persistently ?<60 mL/min signify ?possible Chronic Kidney Disease.  ? GFRAA (L) 05/31/2007 1100  ?  55        ?The eGFR has been calculated ?using the MDRD equation. ?This calculation has not been ?validated in all clinical  ? ?CBC ?   ?Component Value Date/Time  ? WBC 5.7 10/08/2021 0413  ? RBC 4.46 10/08/2021 0413  ? HGB 13.2 10/08/2021 0413  ? HCT 40.6 10/08/2021 0413  ? PLT 210 10/08/2021 0413  ? MCV 91.0 10/08/2021 0413  ? MCH 29.6 10/08/2021 0413  ? MCHC 32.5 10/08/2021 0413  ? RDW 12.5 10/08/2021 0413  ? LYMPHSABS 1.3 10/07/2021 1205  ? MONOABS 0.4 10/07/2021 1205  ? EOSABS 0.2 10/07/2021 1205  ? BASOSABS 0.0 10/07/2021 1205  ? ?HEPATIC Function Panel ?Recent Labs  ?  05/29/21 ?9833 06/10/21 ?8250 10/07/21 ?1205  ?PROT 6.2* 6.4* 5.9*  ? ?HEMOGLOBIN A1C ?No components found for: HGA1C,  MPG ?CARDIAC ENZYMES ?Lab Results  ?Component Value Date  ? CKTOTAL 23 (L) 10/07/2021  ? CKMB 1.8 05/29/2007  ? TROPONINI 0.02        ?  NO INDICATION OF ?MYOCARDIAL INJURY. 05/28/2007  ? TROPONINI 0.03        ?NO INDICATION OF ?MYOCARDIAL INJURY. 05/27/2007  ? ?BNP ?No results for input(s): PROBNP in the last 8760 hours. ?TSH ?No results for input(s): TSH in the last 8760 hours. ?CHOLESTEROL ?Recent Labs  ?  05/24/21 ?0715  ?CHOL 208*  ? ? ?Scheduled Meds: ? heparin injection (subcutaneous)  5,000 Units Subcutaneous Q8H  ? LORazepam  1 mg Sublingual Q6H  ? mouth rinse  15 mL Mouth Rinse BID  ? phenytoin (DILANTIN) IV  75 mg Intravenous Q8H  ? ?Continuous Infusions: ? lacosamide (VIMPAT) IV Stopped (10/13/21 0701)  ? levETIRAcetam Stopped (10/13/21 0701)  ? ?PRN Meds:.acetaminophen, LORazepam ? ?Assessment/Plan: ? Left hemisphere seizures, new onset ?Severe AS ?Atrial fibrillation ?HTN ?HLD ?GERD ?Arthritis ?CKD, II ?Metabolic  acidosis ? ?Plan: ?Comfort care. ? ? LOS: 8 days  ? ?Time spent including chart review, lab review, examination, discussion with patient/Family/Nurse : 30 min ? ? ?Dixie Dials  MD  ?10/15/2021, 10:26 PM ? ? ? ? ?

## 2021-10-15 NOTE — Progress Notes (Signed)
Patient [id number] Sherry [name]      DOB: [birth year]      [id number] ? ? ? ?  ?Palliative Medicine Team ? ? ? ?Subjective: Bedside symptom check. No family or visitors present at time of visit. ? ? ?Physical exam: Patient resting comfortably in bed with eyes closed at time of visit. This RN did not attempt to wake patient. Breathing even and non-labored with cannula in place, no excessive secretions noted. This RN decreased oxygen from 3L to 2L as patient was satting 100% with no signs of distress. Bedside RN Raven made aware. Patient without any physical or non-verbal signs of pain or discomfort at this time.  ? ? ?Assessment and plan: Bedside RN without any needs or concerns at this time. Will continue to wean oxygen as appropriate. Will continue to follow for any changes or advances.  ? ? ?Thank you for allowing the Palliative Medicine Team to assist in the care of this patient. ?  ?  ?Sherry Leavell, MSN, RN ?Palliative Medicine Team ?Team Phone: [phone]  ?This phone is monitored 7a-7p, please reach out to attending physician outside of these hours for urgent needs.   ?

## 2021-10-16 LAB — GLUCOSE, CAPILLARY
Glucose-Capillary: 96 mg/dL (ref 70–99)
Glucose-Capillary: 88 mg/dL (ref 70–99)
Glucose-Capillary: 85 mg/dL (ref 70–99)
Glucose-Capillary: 70 mg/dL (ref 70–99)

## 2021-10-16 NOTE — Progress Notes (Signed)
? ?  Referral received from [facility] for hospice care at home. I have spoken to the family and they are in agreement with hospice care at home. We have discussed goals and they agree with DNR. They were offered our inpatient facility but feel strongly about bringing the pt home to pass. I have ordered oxygen and suction machine for delivery today through [employer]. There are multiple family members who will be assisting in the care of the pt at home and she also has a Scientist, product/process development in the home through [employer] daily for 2 hours. They have a hospital bed that they own and do not feel it need to be replaced at this time. The pt has been approved by our MD for home care services and we will continue to follow and assist with d/c needs. Norm Parcel RN [phone]  ?

## 2021-10-16 NOTE — Progress Notes (Signed)
Multiple attempts by nursing staff and IV team reported by off going nurse.  Re-attempted PIV access x 2. Unable to give anti-seizure medications due to loss of IV access.  MD notified.  ?

## 2021-10-16 NOTE — Progress Notes (Signed)
Patient PI:RJJOAC S Hascall      DOB: [birth year]      [id number] ? ? ? ?  ?Palliative Medicine Team ? ? ? ?Subjective: Bedside symptom check. No family or visitors at this time. Two staff members repositioning at time of visit.  ? ? ?Physical exam: Patient resting in bed with eyes closed at time of visit. Patient has even and non-labored breathing, no excessive secretions noted. Patient without physical or non-verbal signs of pain or discomfort noted. Patient minimally responsive to painful stimuli per nursing staff. This RN did not attempt to wake or stimulate, allowed to continue to rest.  ? ? ?Assessment and plan: PMT will continue to support patient as appropriate. Bedside RN without needs or concerns at this time.  ? ? ? ?Thank you for allowing the Palliative Medicine Team to assist in the care of this patient. ?  ?  ?Shelda Jakes, MSN, RN ?Palliative Medicine Team ?Team Phone: [phone]  ?This phone is monitored 7a-7p, please reach out to attending physician outside of these hours for urgent needs.   ?

## 2021-10-16 NOTE — Progress Notes (Signed)
"  Red med" not given; alert on chart.  ? ?Palliative involved and patient is comfort measures only. Providers already aware of lack of IV access and patient unable to receive IV seizure medications.  ? ? ?

## 2021-10-16 NOTE — Progress Notes (Signed)
Ref: Dixie Dials, MD ? ? ?Subjective:  ?Opens eyes briefly to calling and tapping left shoulder. ?Atrial fibrillation/flutter continues.otherwise VS stable. ? ?Objective:  ?Vital Signs in the last 24 hours: ?Temp:  [98.2 ?F (36.8 ?C)-99.1 ?F (37.3 ?C)] 99.1 ?F (37.3 ?C) (03/30 1934) ?Pulse Rate:  [77-99] 81 (03/30 1935) ?Cardiac Rhythm: Atrial flutter (03/30 1900) ?Resp:  [0-21] 13 (03/30 1935) ?BP: (114-137)/(64-97) 137/97 (03/30 1935) ?SpO2:  [97 %-100 %] 100 % (03/30 1935) ?Weight:  [52.1 kg] 52.1 kg (03/30 0337) ? ?Physical Exam: ?BP Readings from Last 1 Encounters:  ?10/16/21 (!) 137/97  ?   ?Wt Readings from Last 1 Encounters:  ?10/16/21 52.1 kg  ?  Weight change:  Body mass index is 21.7 kg/m?. ?HEENT: [place]/AT, Eyes-Brown, Conjunctiva-Pink, Sclera-Non-icteric ?Neck: No JVD, No bruit, Trachea midline. ?Lungs:  Clear, Bilateral. ?Cardiac:  Irregular rhythm, normal S1 and S2, no S3. II/VI systolic murmur. ?Abdomen:  Soft, non-tender. BS present. ?Extremities:  No edema present. No cyanosis. No clubbing. ?CNS: AxOx0 moves all 4 extremities minimally on command.  ?Skin: Warm and dry. ? ? ?Intake/Output from previous day: ?03/29 0701 - 03/30 0700 ?In: -  ?Out: 200 [Urine:200] ? ? ? ?Lab Results: ?BMET ?   ?Component Value Date/Time  ? NA 139 10/08/2021 0328  ? NA 141 10/07/2021 1354  ? NA 143 10/07/2021 1205  ? K 4.8 10/08/2021 0328  ? K 3.8 10/07/2021 1354  ? K 3.9 10/07/2021 1205  ? CL 106 10/08/2021 0328  ? CL 108 10/07/2021 1205  ? CL 100 06/10/2021 0516  ? CO2 16 (L) 10/08/2021 0328  ? CO2 21 (L) 10/07/2021 1205  ? CO2 25 06/10/2021 0516  ? GLUCOSE 73 10/08/2021 0328  ? GLUCOSE 97 10/07/2021 1205  ? GLUCOSE 94 06/10/2021 0516  ? BUN 26 (H) 10/08/2021 0328  ? BUN 26 (H) 10/07/2021 1205  ? BUN 10 06/10/2021 0516  ? CREATININE 1.13 (H) 10/08/2021 0328  ? CREATININE 1.37 (H) 10/07/2021 1205  ? CREATININE 1.10 (H) 06/10/2021 0516  ? CALCIUM 9.3 10/08/2021 0328  ? CALCIUM 9.0 10/07/2021 1205  ? CALCIUM 9.1  06/10/2021 0516  ? GFRNONAA 43 (L) 10/08/2021 0328  ? GFRNONAA 34 (L) 10/07/2021 1205  ? GFRNONAA 45 (L) 06/10/2021 0516  ? GFRAA 43 (L) 08/07/2017 1519  ? GFRAA (L) 01/18/2010 2350  ?  46        ?The eGFR has been calculated ?using the MDRD equation. ?This calculation has not been ?validated in all clinical ?situations. ?eGFR's persistently ?<60 mL/min signify ?possible Chronic Kidney Disease.  ? GFRAA (L) 05/31/2007 1100  ?  55        ?The eGFR has been calculated ?using the MDRD equation. ?This calculation has not been ?validated in all clinical  ? ?CBC ?   ?Component Value Date/Time  ? WBC 5.7 10/08/2021 0413  ? RBC 4.46 10/08/2021 0413  ? HGB 13.2 10/08/2021 0413  ? HCT 40.6 10/08/2021 0413  ? PLT 210 10/08/2021 0413  ? MCV 91.0 10/08/2021 0413  ? MCH 29.6 10/08/2021 0413  ? MCHC 32.5 10/08/2021 0413  ? RDW 12.5 10/08/2021 0413  ? LYMPHSABS 1.3 10/07/2021 1205  ? MONOABS 0.4 10/07/2021 1205  ? EOSABS 0.2 10/07/2021 1205  ? BASOSABS 0.0 10/07/2021 1205  ? ?HEPATIC Function Panel ?Recent Labs  ?  05/29/21 ?0175 06/10/21 ?1025 10/07/21 ?1205  ?PROT 6.2* 6.4* 5.9*  ? ?HEMOGLOBIN A1C ?No components found for: HGA1C,  MPG ?CARDIAC ENZYMES ?Lab Results  ?Component Value  Date  ? CKTOTAL 23 (L) 10/07/2021  ? CKMB 1.8 05/29/2007  ? TROPONINI 0.02        ?NO INDICATION OF ?MYOCARDIAL INJURY. 05/28/2007  ? TROPONINI 0.03        ?NO INDICATION OF ?MYOCARDIAL INJURY. 05/27/2007  ? ?BNP ?No results for input(s): PROBNP in the last 8760 hours. ?TSH ?No results for input(s): TSH in the last 8760 hours. ?CHOLESTEROL ?Recent Labs  ?  05/24/21 ?0715  ?CHOL 208*  ? ? ?Scheduled Meds: ? heparin injection (subcutaneous)  5,000 Units Subcutaneous Q8H  ? LORazepam  1 mg Sublingual Q6H  ? mouth rinse  15 mL Mouth Rinse BID  ? ?Continuous Infusions: ? ?PRN Meds:.acetaminophen, LORazepam ? ?Assessment/Plan: ? Seizure disorder, new onset ?Severe AS ?Atrial flutter/fibrillation ?HTN ?HLD ?GERD ?CKD, II ?Metabolic acidosis ?CKD,  II ? ?Plan: ?Awaiting home hospice arrangement ? ? LOS: 9 days  ? ?Time spent including chart review, lab review, examination, discussion with patient/Family : 30 min ? ? ?Dixie Dials  MD  ?10/16/2021, 10:13 PM ? ? ? ? ?

## 2021-10-17 LAB — GLUCOSE, CAPILLARY: Glucose-Capillary: 63 mg/dL — ABNORMAL LOW (ref 70–99)

## 2021-10-17 MED ORDER — LORAZEPAM 2 MG/ML PO CONC
1.0000 mg | ORAL | 0 refills | Status: AC | PRN
Start: 2021-10-17 — End: ?

## 2021-10-17 MED ORDER — LORAZEPAM 2 MG/ML PO CONC: 1.0000 mg | Freq: Four times a day (QID) | ORAL | 0 refills | Status: AC

## 2021-10-17 MED ORDER — ACETAMINOPHEN 650 MG RE SUPP
650.0000 mg | Freq: Four times a day (QID) | RECTAL | 0 refills | Status: AC | PRN
Start: 2021-10-17 — End: ?

## 2021-10-17 NOTE — Discharge Summary (Addendum)
Physician Discharge Summary  ?Patient ID: ?Sherry [name] ?MRN: [id number] ?DOB/AGE: Nov 17, 1919 88 y.o. ? ?Admit date: 10/07/2021 ?Discharge date: 10/17/2021 ? ?Admission Diagnoses: ?Seizure disorder, focal, new onset ?Acute respiratory failure with hypoxia ?HTN ?HLD ?GERD ?Paroxysmal atrial fibrillation ?Arthritis ? ?Discharge Diagnoses:  ?Principal Problem: ?  Seizures (HCC), new onset ?Active problem: ?  Severe aortic valve stenosis ?  Chronic atrial flutter/fibrillation with controlled ventricular response ?  HTN, controlled without medications ?  HLD ?  GERD ?  CKD II ?  Metabolic acidosis, resolved ?  Protein calorie malnutrition ?  Arthritis ? ?Discharged Condition: poor ? ?Hospital Course: 88 years old black female presented with new onset seizure disorder. She was treated with anti-seizure medications post evaluation by neurologist. ?Subsequently patient was made DNR and comfort care per patient and family wishes. All medications and nutrition were held except lorazepam for seizure control and comfort care. ?Today on day of discharge she is responding poorly. She will have sublingual lorazepam for seizure control. ?She will have hospice care at home and will see me as needed. ? ?Consults: cardiology and neurology ? ?Significant Diagnostic Studies: labs: Near normal CBC. CMET showing mild renal dysfunction and hypoalbuminemia. ?Dilantin level was normal when used. ?Blood sugar was low to normal without feeding for 4 days. ? ?Treatments: Keppra, Phenytoin, High dose solumedrol followed by IM lorazepam and now SL lorazepam.  ? ?Discharge Exam: ?Blood pressure 139/72, pulse 81, temperature 99.1 ?F (37.3 ?C), resp. rate 18, height 5\' 1"  (1.549 m), weight 52.1 kg, SpO2 100 %. ?General appearance: sleepy, cooperative and appears stated age. ?Head: Normocephalic, atraumatic. ?Eyes: Brown eyes, pink conjunctiva, corneas clear.   ?Neck: No adenopathy, no carotid bruit, no JVD, supple, symmetrical, trachea midline and  thyroid not enlarged. ?Resp: Clear to auscultation bilaterally. ?Cardio: Irregular rate and rhythm, S1, S2 normal, II/VI systolic murmur, no click, rub or gallop. ?GI: Soft, non-tender; bowel sounds normal; no organomegaly. ?Extremities: No edema, cyanosis or clubbing. ?Skin: Warm and dry.  ?Neurologic: Alert and oriented X 0. ? ?Disposition: Discharge disposition: 01-Home or Self Care ? ? ? ? ? ? ? ?Allergies as of 10/17/2021   ?No Known Allergies ?  ? ?  ?Medication List  ?  ? ?STOP taking these medications   ? ?acetaminophen 325 MG tablet ?Commonly known as: TYLENOL ?Replaced by: acetaminophen 650 MG suppository ?  ?aspirin 81 MG chewable tablet ?  ?atorvastatin 20 MG tablet ?Commonly known as: LIPITOR ?  ?B-complex with vitamin C tablet ?  ?clopidogrel 75 MG tablet ?Commonly known as: PLAVIX ?  ?diltiazem 30 MG tablet ?Commonly known as: CARDIZEM ?  ?docusate sodium 100 MG capsule ?Commonly known as: COLACE ?  ?famotidine 20 MG tablet ?Commonly known as: PEPCID ?  ?magnesium hydroxide 400 MG/5ML suspension ?Commonly known as: MILK OF MAGNESIA ?  ?metoprolol tartrate 50 MG tablet ?Commonly known as: LOPRESSOR ?  ?omeprazole 40 MG capsule ?Commonly known as: PRILOSEC ?  ?pantoprazole 40 MG tablet ?Commonly known as: PROTONIX ?  ?Vitamin D3 25 MCG tablet ?Commonly known as: Vitamin D ?  ? ?  ? ?TAKE these medications   ? ?acetaminophen 650 MG suppository ?Commonly known as: TYLENOL ?Place 1 suppository (650 mg total) rectally every 6 (six) hours as needed for fever. ?Replaces: acetaminophen 325 MG tablet ?  ?LORazepam 2 MG/ML concentrated solution ?Commonly known as: ATIVAN ?Place 0.5 mLs (1 mg total) under the tongue every 6 (six) hours. ?  ?LORazepam 2 MG/ML concentrated solution ?Commonly known as: ATIVAN ?  Place 0.5 mLs (1 mg total) under the tongue every 4 (four) hours as needed for seizure or anxiety (distress). ?  ? ?  ? ? Follow-up Information   ? ? Orpah Cobb, MD .   ?Specialty: Cardiology ?Why: As  needed ?Contact information: ?[address] ?[address] ?[place] [address] ?[phone] ? ? ?  ?  ? ?  ?  ? ?  ? ? ?Time spent: Review of old chart, current chart, lab, x-ray, cardiac tests and discussion with patient over 60 minutes. ? ?Signed: ?Ricki Rodriguez ?10/17/2021, 11:15 AM ? ? ?

## 2021-10-17 NOTE — Progress Notes (Signed)
?                                                                                                                                                     ?                                                   ?Daily Progress Note  ? ?Patient Name: Sherry [name]       Date: 10/17/2021 ?DOB: Apr 05, 1920  Age: 88 y.o. MRN#: [id number] ?Attending Physician: Dixie Dials, MD ?Primary Care Physician: Dixie Dials, MD ?Admit Date: 10/07/2021 ? ?Reason for Consultation/Follow-up: Non pain symptom management, Pain control, Psychosocial/spiritual support, and Terminal Care ? ?Subjective: ?Chart review performed.  Received report from primary RN -no acute concerns.  Reports patient is not eating or drinking, remains lethargic. ? ?Went to visit patient at bedside -son/[name], daughter/[name], daughter/Inaa, daughter/Joan present.  Patient was lying in bed -she briefly arouses to voice/gentle touch.  No signs or non-verbal gestures of pain or discomfort noted. No respiratory distress, increased work of breathing, or secretions noted.  She is on room air. ? ?Emotional support provided to family.  Confirmed goal is for discharge today with home hospice - family are ready for patient discharge today.  Discussed current symptom management regimen of Ativan sublingual every 6 hours.  Medication education provided to family - they expressed understanding.  Trajectory at end-of-life as well as prognostication reviewed per their request.  Family express joy that patient is coming home today. ? ?Addressed questions around decreased oral intake.  Education provided on careful hand feeding and that decreased oral intake is part of the natural trajectory at end-of-life.  Aspiration precautions reviewed. ? ?All questions and concerns addressed. Encouraged to call with questions and/or concerns. PMT card provided. ? ?Length of Stay: 10 ? ?Current Medications: ?Scheduled Meds:  ? heparin injection (subcutaneous)  5,000 Units Subcutaneous Q8H  ?  LORazepam  1 mg Sublingual Q6H  ? mouth rinse  15 mL Mouth Rinse BID  ? ? ?Continuous Infusions: ? ? ?PRN Meds: ?acetaminophen, LORazepam ? ?Physical Exam ?Vitals and nursing note reviewed.  ?Constitutional:   ?   General: She is not in acute distress. ?   Appearance: She is ill-appearing.  ?Pulmonary:  ?   Effort: No respiratory distress.  ?Skin: ?   General: Skin is warm and dry.  ?Neurological:  ?   Mental Status: She is unresponsive.  ?   Motor: Weakness present.  ?Psychiatric:     ?   Speech: She is noncommunicative.  ?         ? ?Vital Signs: BP 139/72 (BP Location: Right Arm)   Pulse 81  Temp 99.1 ?F (37.3 ?C)   Resp 18   Ht 5\' 1"  (1.549 m)   Wt 52.1 kg   SpO2 100%   BMI 21.70 kg/m?  ?SpO2: SpO2: 100 % ?O2 Device: O2 Device: Room Air ?O2 Flow Rate: O2 Flow Rate (L/min): 2 L/min ? ?Intake/output summary:  ?Intake/Output Summary (Last 24 hours) at 10/17/2021 1032 ?Last data filed at 10/17/2021 0345 ?Gross per 24 hour  ?Intake 0 ml  ?Output 300 ml  ?Net -300 ml  ? ?LBM: Last BM Date :  (PTA) ?Baseline Weight: Weight: 47.4 kg ?Most recent weight: Weight: 52.1 kg ? ?     ?Palliative Assessment/Data: PPS 10% ? ? ? ? ? ?Patient Active Problem List  ? Diagnosis Date Noted  ? Seizures ([place]) 10/07/2021  ? Sore throat   ? Hyponatremia   ? Essential hypertension   ? Generalized arthritis   ? Acute ischemic left middle cerebral artery (MCA) stroke ([place]) 05/28/2021  ? Pressure injury of skin 05/26/2021  ? Stroke (cerebrum) ([place]) 05/24/2021  ? Callus 04/07/2021  ? Pelvic pain 11/25/2020  ? Slow transit constipation 11/25/2020  ? Atrial fibrillation ([place]) 10/17/2020  ? ? ?Palliative Care Assessment & Plan  ? ?Patient Profile: ?88 y.o. female  with past medical history of atrial fibrillation, recent stroke, osteoarthritis, GERD, hypertension, hyperlipidemia who presented to the emergency department on 10/07/2021 with new onset seizures. Admitted to Dr. Doylene Canard.  ?  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 ? ?Assessment: ?Principal Problem: ?  Seizures ([place]) ?Terminal care ? ?Recommendations/Plan: ?Continue full comfort care ?Continue DNR/DNI as previously documented ?Discharge home with hospice today ?Continue Ativan 1 mg sublingual every 6 hours ?PMT will continue to follow peripherally. If there are any imminent needs please call the service directly ? ?Goals of Care and Additional Recommendations: ?Limitations on Scope of Treatment: Full Comfort Care ? ?Code Status: ? ?  ?Code Status Orders  ?(From admission, onward)  ?  ? ? ?  ? ?  Start     Ordered  ? 10/13/21 1201  Do not attempt resuscitation (DNR)  Continuous       ?Question Answer Comment  ?In the event of cardiac or respiratory ARREST Do not call a ?code blue?   ?In the event of cardiac or respiratory ARREST Do not perform Intubation, CPR, defibrillation or ACLS   ?In the event of cardiac or respiratory ARREST Use medication by any route, position, wound care, and other measures to relive pain and suffering. May use oxygen, suction and manual treatment of airway obstruction as needed for comfort.   ?  ? 10/13/21 1200  ? ?  ?  ? ?  ? ?Code Status History   ? ? Date Active Date Inactive Code Status Order ID Comments User Context  ? 10/07/2021 1426 10/13/2021 1200 Partial Code [id number]  Dixie Dials, MD ED  ? 05/28/2021 1529 06/11/2021 1710 DNR [id number]  Bary Leriche, PA-C Inpatient  ? 05/28/2021 1529 05/28/2021 1529 DNR [id number]  Bary Leriche, PA-C Inpatient  ? 05/24/2021 0640 05/28/2021 1526 DNR [id number]  Greta Doom, MD ED  ? ?  ? ?Advance Directive Documentation   ? ?Flowsheet Row Most Recent Value  ?Type of Advance Directive Healthcare Power of Attorney  ?Pre-existing out of facility DNR order (yellow form or pink MOST form) --  ?"MOST" Form in Place? --  ? ?  ? ? ?  Prognosis: ? Hours - Days ? ?Discharge Planning: ?Home with  Hospice ? ?Care plan was discussed with primary RN, patient's family, TOC, Dr. Doylene Canard ? ?Thank you for allowing the Palliative Medicine Team to assist in the care of this patient. ? ? ?Total Time 37 minutes Prolonged Time Billed  no   ? ?   ?Greater than 50%  of this time was spent counseling and coordinating care related to the above assessment and plan. ? ?Lin Landsman, NP ? ?Please contact Palliative Medicine Team phone at [phone] for questions and concerns.  ? ? ? ? ? ?

## 2021-10-17 NOTE — TOC Transition Note (Signed)
Transition of Care (TOC) - CM/SW Discharge Note ? ? ?Patient Details  ?Name: Sherry [name] ?MRN: [id number] ?Date of Birth: Jul 06, 1920 ? ?Transition of Care (TOC) CM/SW Contact:  ?Kermit Balo, RN ?Phone Number: ?10/17/2021, 11:28 AM ? ? ?Clinical Narrative:    ?Patient is discharging home with hospice services through [facility]. [name] with hospice aware and verified delivery of the DME to the home. Daughter at the bedside and aware of plan. Address verified and PTAR arranged. Bedside RN updated and d/c packet is at the desk.  ? ? ? ?Final next level of care: Home w Hospice Care ?Barriers to Discharge: No Barriers Identified ? ? ?Patient Goals and CMS Choice ?  ?CMS Medicare.gov Compare Post Acute Care list provided to:: Patient Represenative (must comment) ?Choice offered to / list presented to : Adult Children ? ?Discharge Placement ?  ?           ?  ?  ?  ?  ? ?Discharge Plan and Services ?  ?Discharge Planning Services: CM Consult ?           ?  ?  ?  ?  ?  ?  ?  ?  ?  ?  ? ?Social Determinants of Health (SDOH) Interventions ?  ? ? ?Readmission Risk Interventions ?   ? View : No data to display.  ?  ?  ?  ? ? ? ? ? ?

## 2022-06-19 IMAGING — DX DG KNEE COMPLETE 4+V*R*
4 series · 4 of 4 positions shown · non-contrast
Comparison: None.

CLINICAL DATA: Fall 5 days ago.  Right knee pain.

EXAM:
RIGHT KNEE - COMPLETE 4+ VIEW

[knee ap]
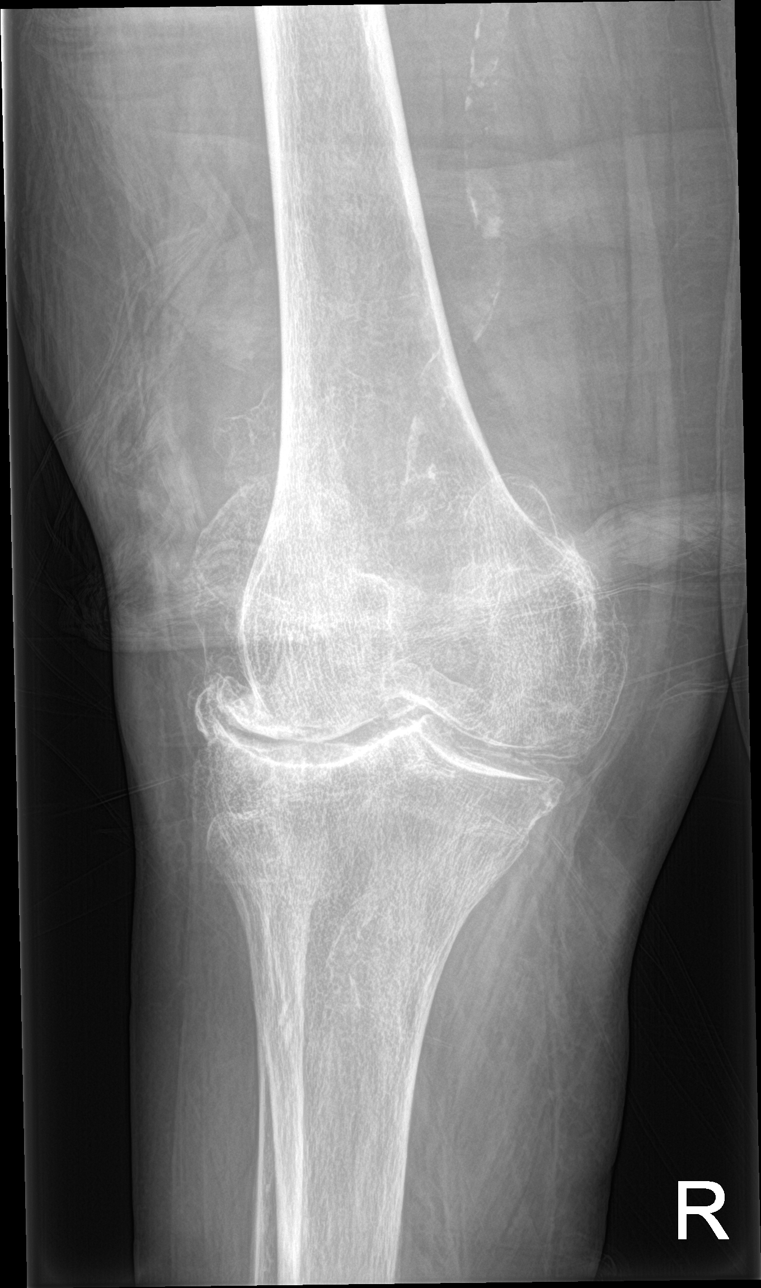

[knee lat]
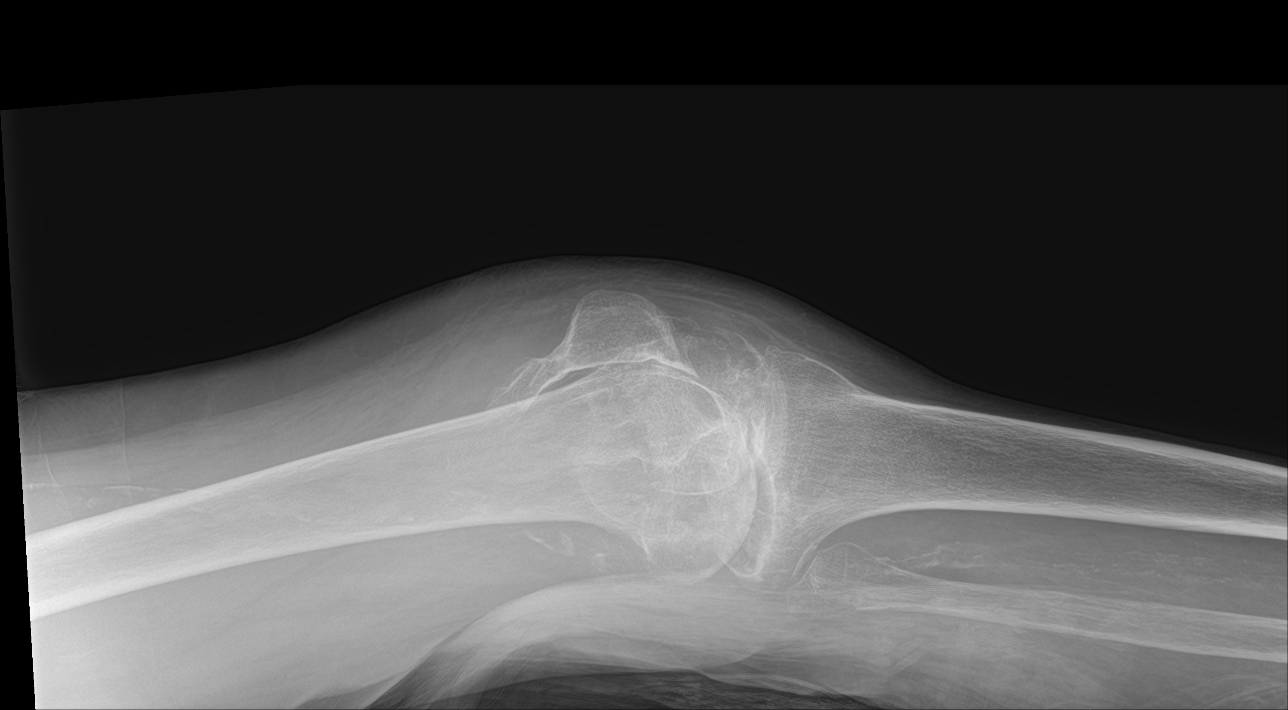

[knee obl (1 of 2)]
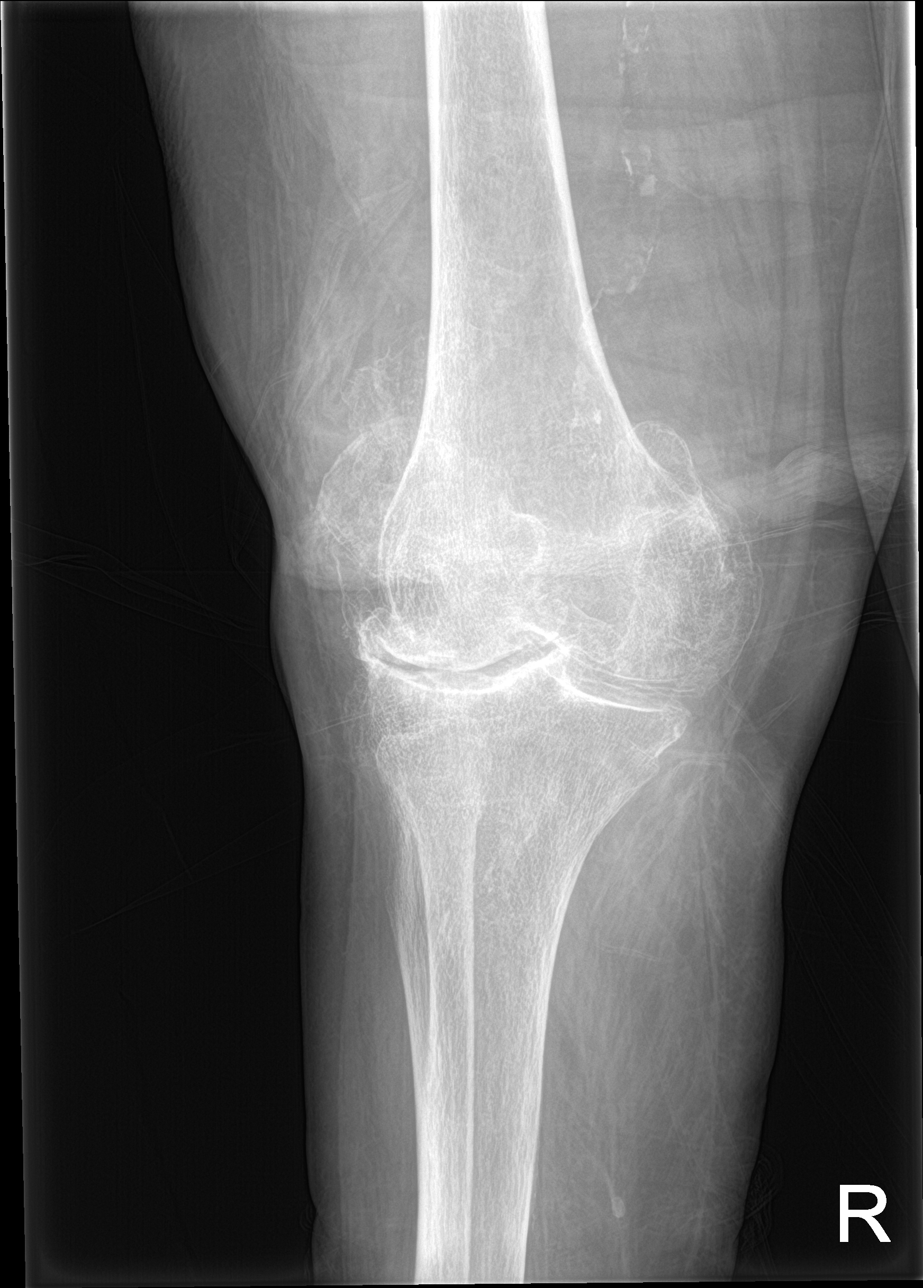

[knee obl (2 of 2)]
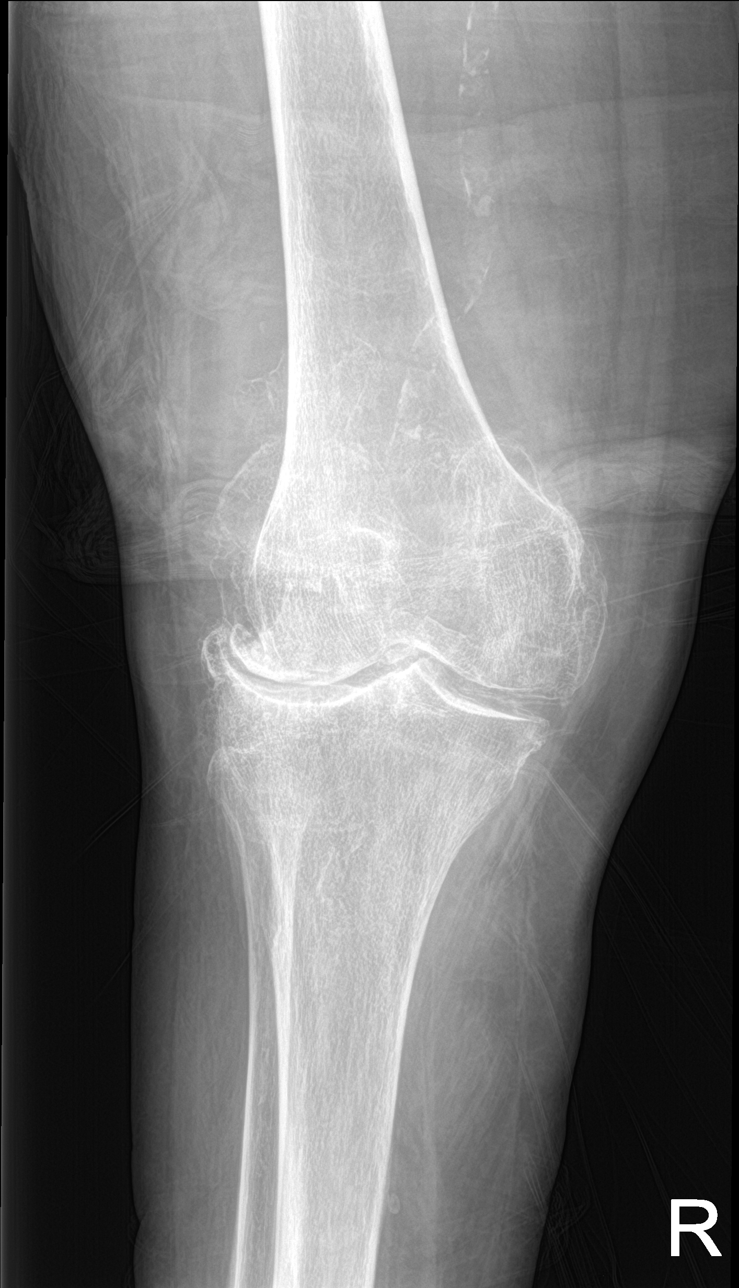

[4 of 4 positions shown; findings below may reference images not displayed]

FINDINGS: No convincing fracture.  No bone lesion.

Tricompartmental arthropathic changes most notably affecting the
lateral patellofemoral joint space compartments, with joint space
narrowing, mild subchondral sclerosis and prominent marginal
osteophytes.

Skeletal structures diffusely demineralized.

Small joint effusion.

There dense arterial vascular calcifications posteriorly.
IMPRESSION: 1. No fracture or dislocation.
2. Tricompartmental degenerative/arthropathic changes predominantly
involving the lateral and patellofemoral joint space compartments
associated with a small joint effusion.

## 2022-06-19 IMAGING — DX DG PELVIS 1-2V
1 series · 1 of 1 positions shown · non-contrast
Comparison: 02/26/2010.

CLINICAL DATA: Fell 5 days ago.  Pelvic pain.

EXAM:
PELVIS - 1-2 VIEW

[pelvis ap]
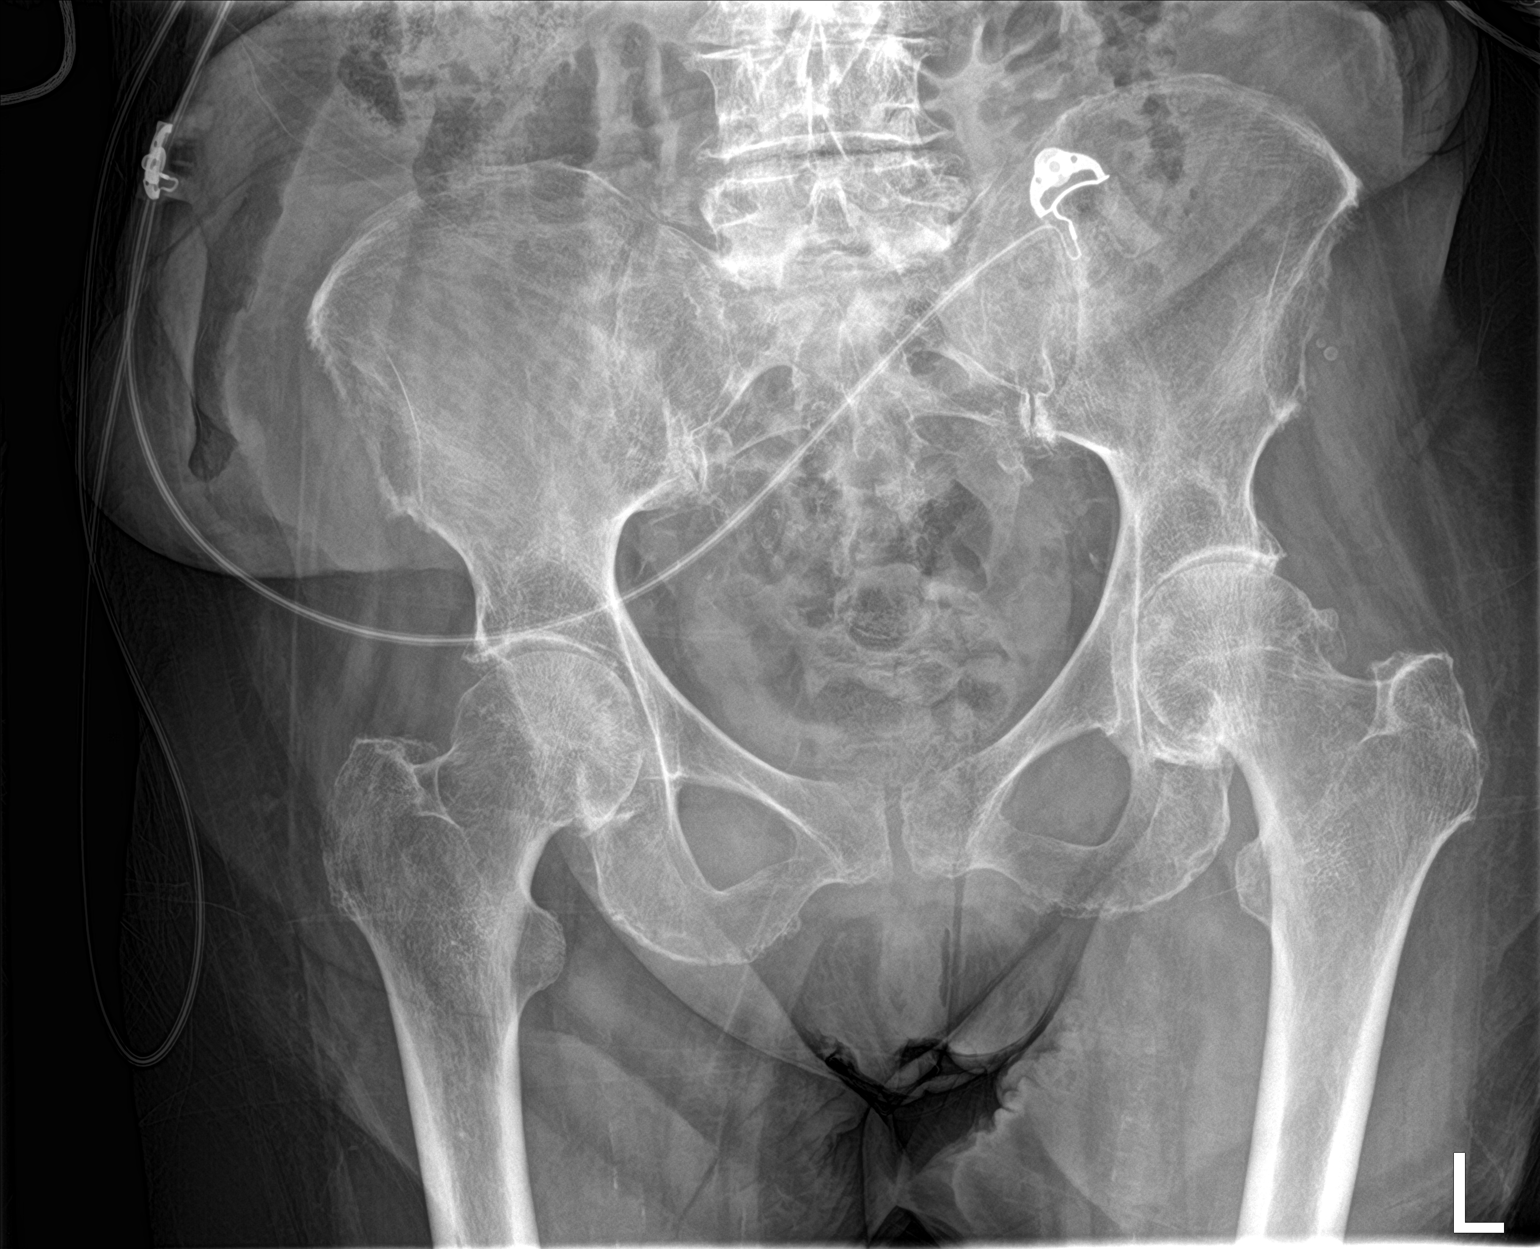

[1 of 1 positions shown; findings below may reference images not displayed]

FINDINGS: No fracture or bone lesion. Skeletal structures diffusely
demineralized.

Bilateral axial hip joint space narrowing with marginal osteophytes
from the bases of the femoral heads, lying slightly greater on the
left.

Mild degenerative changes of the inferior SI joints. SI joints
normally aligned. Pubic symphysis normally spaced and aligned.

Disc degenerative changes noted of the visible lower lumbar spine.

Soft tissues are unremarkable other than arterial vascular
calcifications.
IMPRESSION: 1. No fracture or acute finding.
